# Patient Record
Sex: Female | Born: 2000 | Race: Black or African American | Hispanic: No | Marital: Single | Smoking: Former smoker
Health system: Southern US, Community
[De-identification: ages and names within clinical notes are randomized; demographics above are authoritative.]

## PROBLEM LIST (undated history)

## (undated) ENCOUNTER — Inpatient Hospital Stay (HOSPITAL_COMMUNITY): Payer: Self-pay

## (undated) DIAGNOSIS — Z5189 Encounter for other specified aftercare: Secondary | ICD-10-CM

## (undated) DIAGNOSIS — A609 Anogenital herpesviral infection, unspecified: Secondary | ICD-10-CM

## (undated) DIAGNOSIS — T783XXA Angioneurotic edema, initial encounter: Secondary | ICD-10-CM

## (undated) DIAGNOSIS — N938 Other specified abnormal uterine and vaginal bleeding: Secondary | ICD-10-CM

## (undated) DIAGNOSIS — O24419 Gestational diabetes mellitus in pregnancy, unspecified control: Secondary | ICD-10-CM

## (undated) DIAGNOSIS — K5909 Other constipation: Secondary | ICD-10-CM

## (undated) DIAGNOSIS — F32A Depression, unspecified: Secondary | ICD-10-CM

## (undated) DIAGNOSIS — K219 Gastro-esophageal reflux disease without esophagitis: Secondary | ICD-10-CM

## (undated) DIAGNOSIS — O139 Gestational [pregnancy-induced] hypertension without significant proteinuria, unspecified trimester: Secondary | ICD-10-CM

## (undated) DIAGNOSIS — D649 Anemia, unspecified: Secondary | ICD-10-CM

## (undated) DIAGNOSIS — Z21 Asymptomatic human immunodeficiency virus [HIV] infection status: Secondary | ICD-10-CM

## (undated) DIAGNOSIS — L509 Urticaria, unspecified: Secondary | ICD-10-CM

## (undated) DIAGNOSIS — B373 Candidiasis of vulva and vagina: Secondary | ICD-10-CM

## (undated) DIAGNOSIS — K8020 Calculus of gallbladder without cholecystitis without obstruction: Secondary | ICD-10-CM

## (undated) DIAGNOSIS — B3731 Acute candidiasis of vulva and vagina: Secondary | ICD-10-CM

## (undated) DIAGNOSIS — E119 Type 2 diabetes mellitus without complications: Secondary | ICD-10-CM

## (undated) DIAGNOSIS — B20 Human immunodeficiency virus [HIV] disease: Secondary | ICD-10-CM

## (undated) HISTORY — DX: Other specified abnormal uterine and vaginal bleeding: N93.8

## (undated) HISTORY — DX: Urticaria, unspecified: L50.9

## (undated) HISTORY — DX: Gastro-esophageal reflux disease without esophagitis: K21.9

## (undated) HISTORY — DX: Anogenital herpesviral infection, unspecified: A60.9

## (undated) HISTORY — DX: Candidiasis of vulva and vagina: B37.3

## (undated) HISTORY — DX: Other constipation: K59.09

## (undated) HISTORY — PX: NO PAST SURGERIES: SHX2092

## (undated) HISTORY — DX: Angioneurotic edema, initial encounter: T78.3XXA

## (undated) HISTORY — DX: Acute candidiasis of vulva and vagina: B37.31

## (undated) HISTORY — DX: Encounter for other specified aftercare: Z51.89

## (undated) HISTORY — DX: Calculus of gallbladder without cholecystitis without obstruction: K80.20

---
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
## 2019-09-26 IMAGING — DX DG KNEE COMPLETE 4+V*L*
4 series · 4 of 4 positions shown · non-contrast
Comparison: 08/07/2012

CLINICAL DATA: BILATERAL KNEE PAIN, PATIENT STATES " NO KNOWN NEW
INJURY, INJURED RIGHT KNEE IN MIDDLE SCHOOL HAS HAD PAIN ON AND OFF
SINCE THEN, HAS HAD TROUBLE WITH BOTH KNEES SWELLING IN THE PAST BUT
NOT AS MUCH AS THEY HAVE SWOLLEN THIS PAST WEEK , RIGHT KNEE IS THE
MOST PAINFUL, PAIN WHEN WALKING

EXAM:
LEFT KNEE - COMPLETE 4+ VIEW

[knee ap]
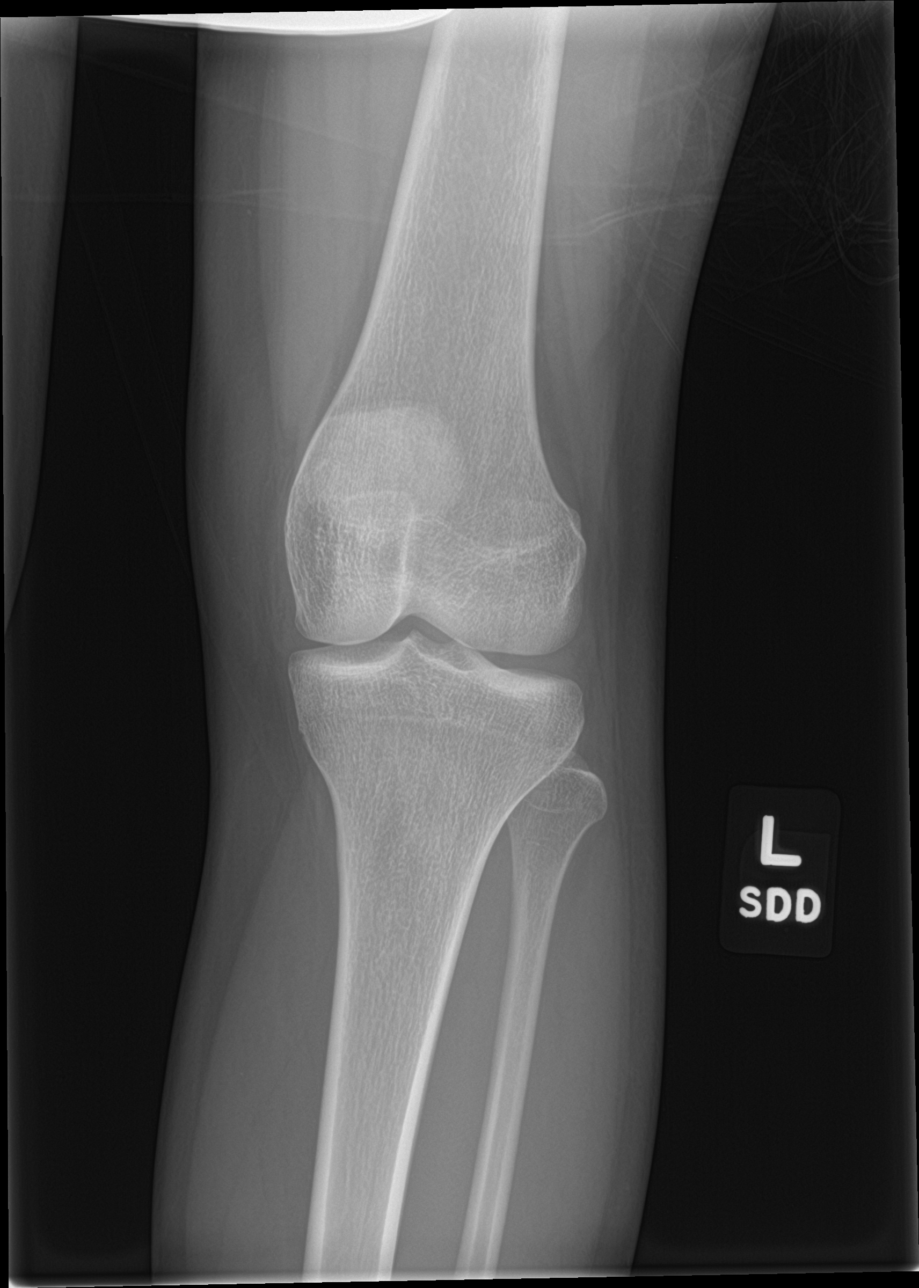

[tunnel]
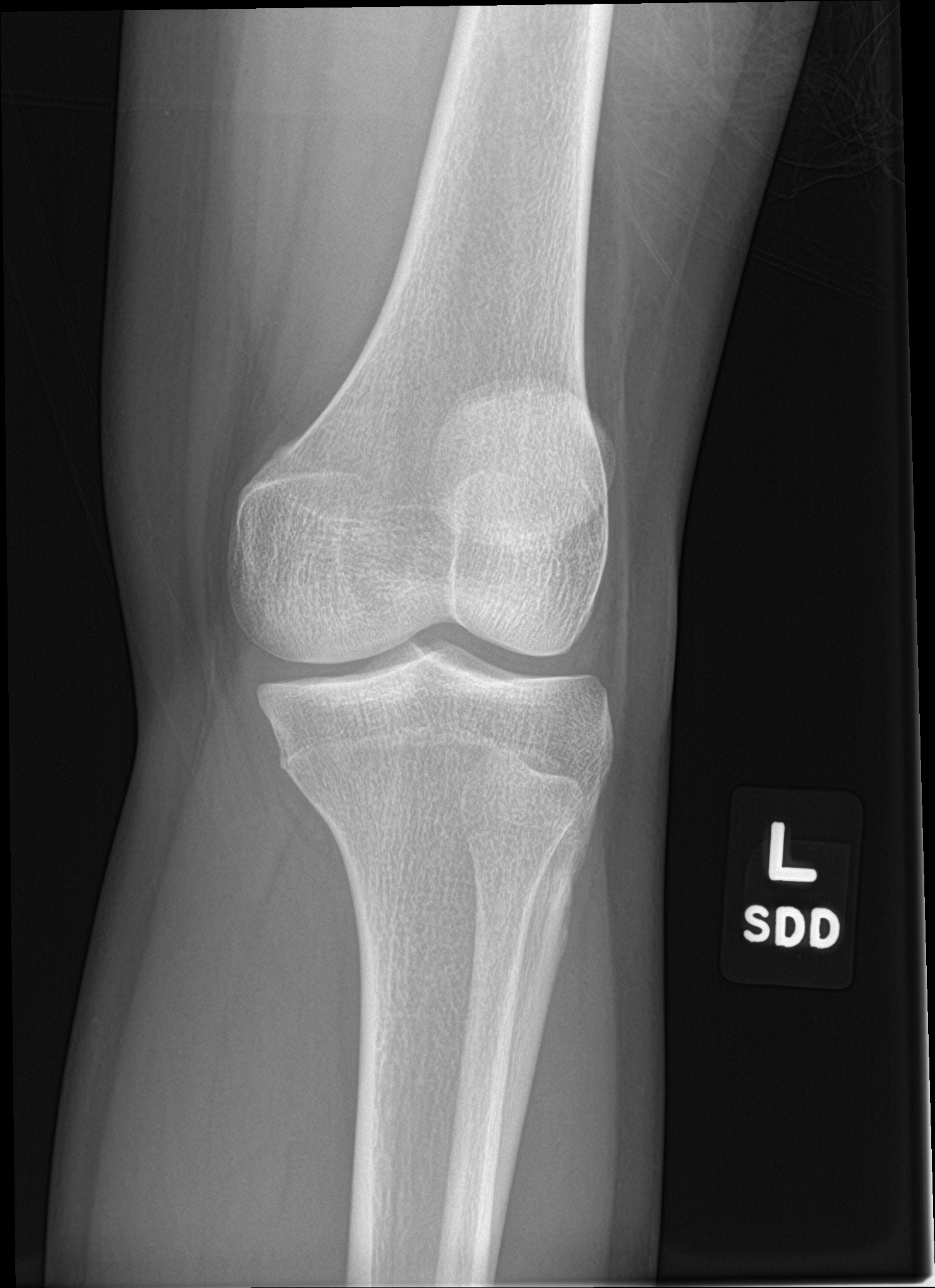

[knee lat]
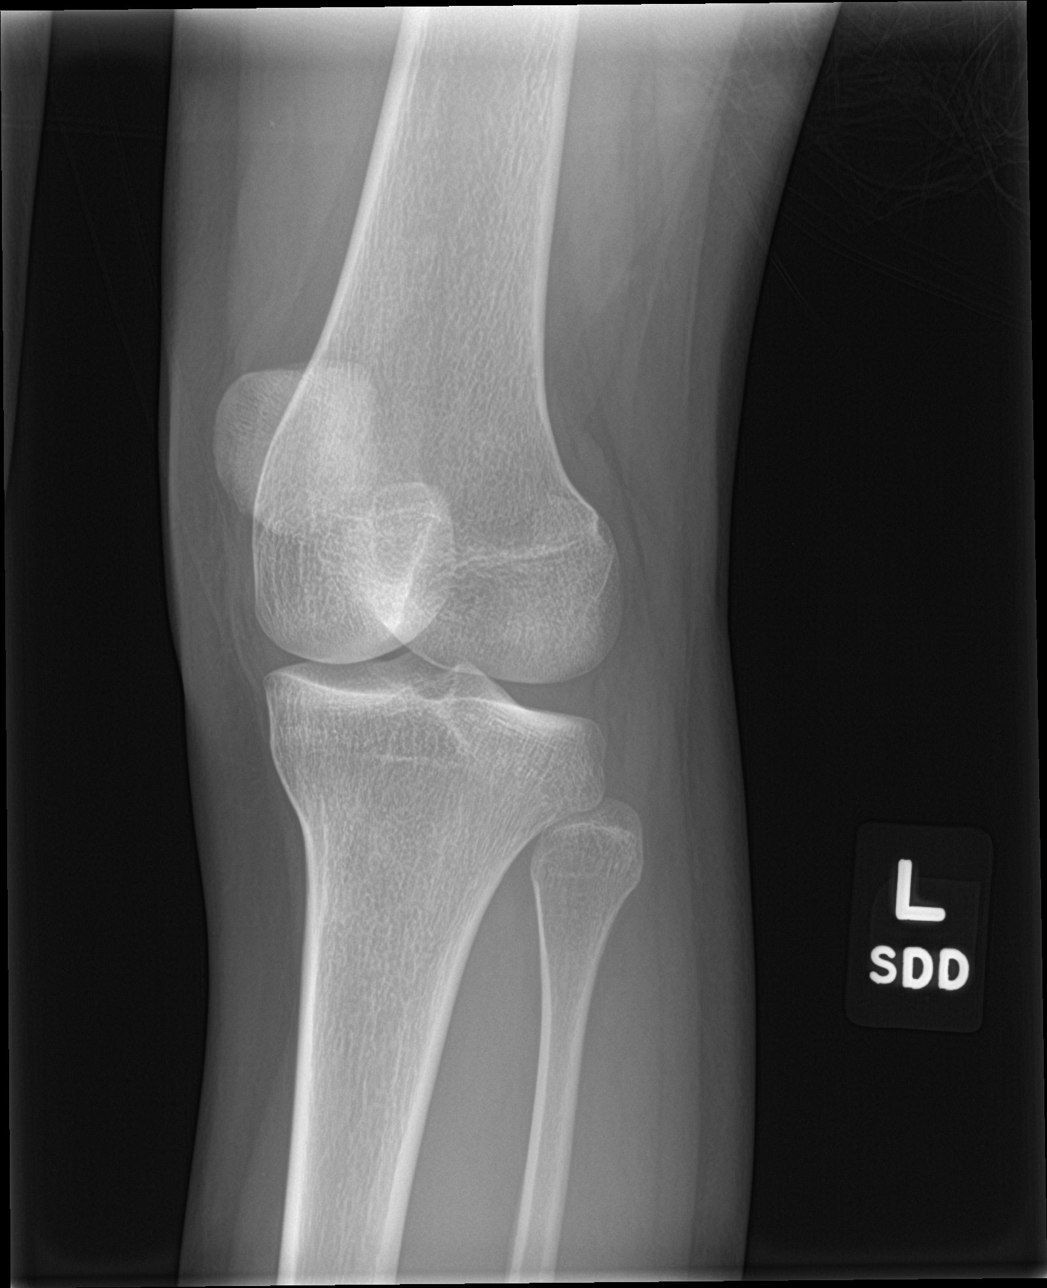

[knee sunrise]
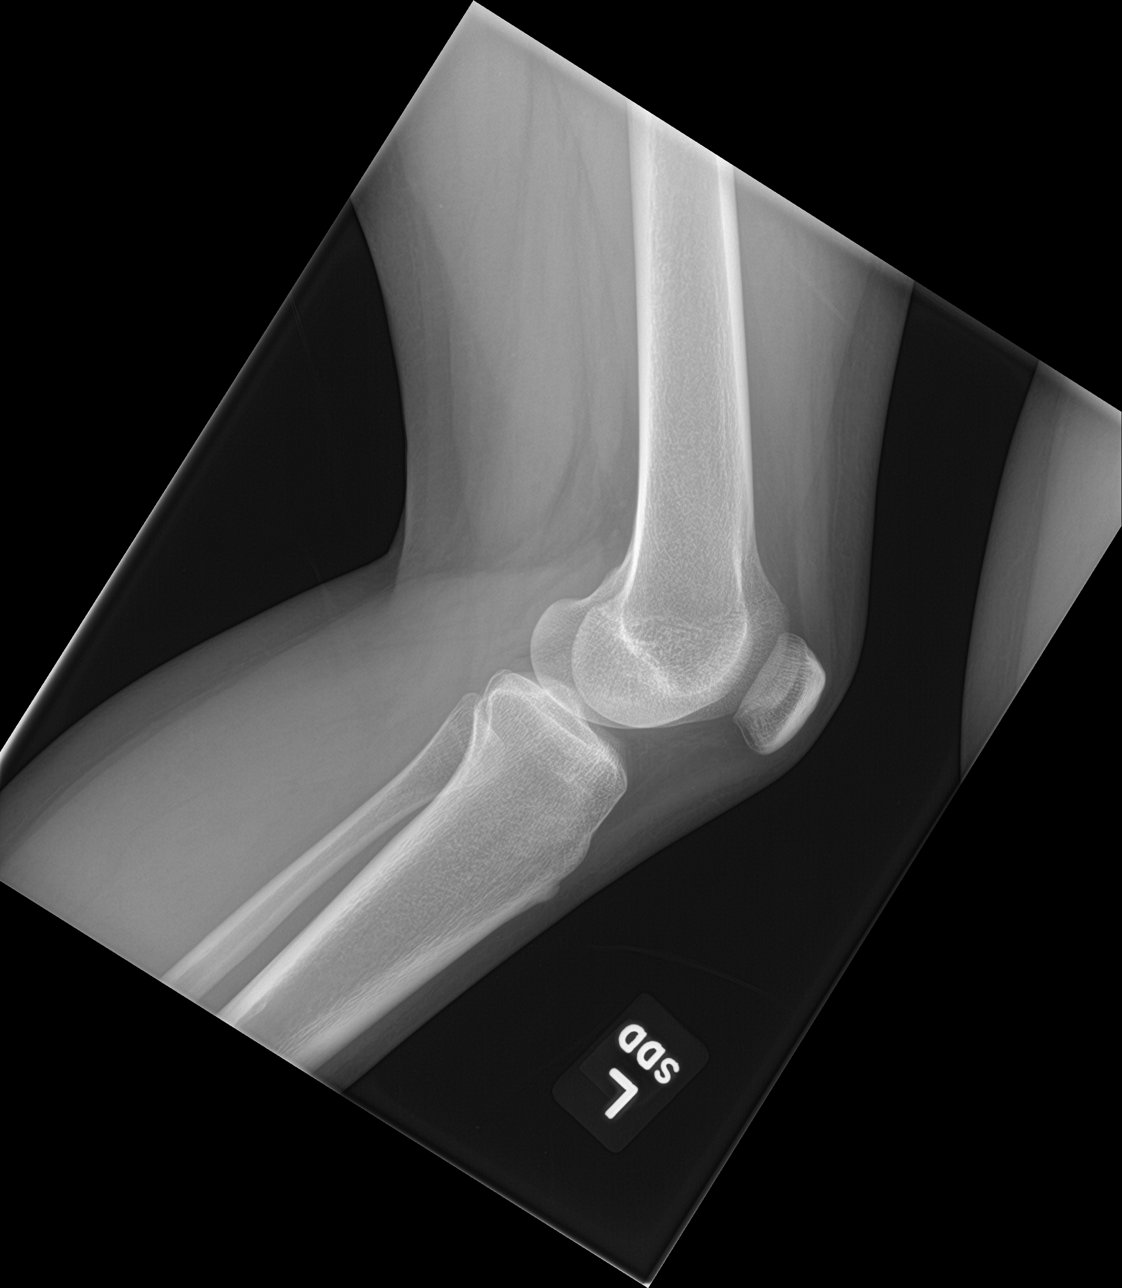

[4 of 4 positions shown; findings below may reference images not displayed]

FINDINGS: No evidence of fracture, dislocation, or joint effusion. Interval
growth. No evidence of arthropathy or other focal bone abnormality.
Soft tissues are unremarkable.
IMPRESSION: Negative.

## 2019-11-09 ENCOUNTER — Emergency Department (HOSPITAL_COMMUNITY)
Admission: EM | Admit: 2019-11-09 | Discharge: 2019-11-09 | Discharge status: Against Medical Advice | Payer: BC Managed Care – PPO

## 2019-11-09 ENCOUNTER — Ambulatory Visit
Admission: EM | Admit: 2019-11-09 | Discharge: 2019-11-09 | Disposition: A | Discharge status: Home / Self Care | Payer: BC Managed Care – PPO

## 2019-11-09 ENCOUNTER — Other Ambulatory Visit: Payer: Self-pay

## 2019-11-09 DIAGNOSIS — R1084 Generalized abdominal pain: Secondary | ICD-10-CM

## 2019-11-09 DIAGNOSIS — R110 Nausea: Secondary | ICD-10-CM

## 2019-11-09 NOTE — ED Provider Notes (Signed)
[facility]   [id number] 11/09/19 Arrival Time: 1307  CC: ABDOMINAL DISCOMFORT  SUBJECTIVE:  Alyssa Sandoval is a 19 y.o. female who presents with complaint of abdominal discomfort that began 1 day ago.  Denies a precipitating event, trauma, close contacts with similar symptoms, recent travel or antibiotic use.  Pain is diffuse.  Describes as constant and sharp in character.  Has tried OTC medications without relief.  Denies alleviating or aggravating factors.  Denies similar symptoms in the past.  Last BM today and normal for pain. Complains of headache and nausea as well.    Denies fever, chills, vomiting, chest pain, SOB, diarrhea, constipation, hematochezia, melena, dysuria, difficulty urinating, increased frequency or urgency, flank pain, loss of bowel or bladder function, vaginal discharge, vaginal odor, vaginal bleeding, dyspareunia, pelvic pain.     Patient's last menstrual period was 10/11/2019 (approximate).  ROS: As per HPI.  All other pertinent ROS negative.     Past Medical History:  Diagnosis Date  . Chronic constipation 06/10/2012  . Dysfunctional uterine bleeding   . GERD (gastroesophageal reflux disease) 06/10/2012  . Vaginal yeast infection    Past Surgical History:  Procedure Laterality Date  . NO PAST SURGERIES     No Known Allergies No current facility-administered medications on file prior to encounter.   Current Outpatient Medications on File Prior to Encounter  Medication Sig Dispense Refill  . ibuprofen (ADVIL) 200 MG tablet Take 200 mg by mouth every 6 (six) hours as needed.    . [DISCONTINUED] norelgestromin-ethinyl estradiol (ORTHO EVRA) 150-35 MCG/24HR transdermal patch Place 1 patch onto the skin once a week. 3 patch 12   Social History   Socioeconomic History  . Marital status: Single    Spouse name: Not on file  . Number of children: Not on file  . Years of education: Not on file  . Highest education level: Not on file  Occupational  History  . Not on file  Tobacco Use  . Smoking status: Current Every Day Smoker    Types: Cigars  . Smokeless tobacco: Never Used  Vaping Use  . Vaping Use: Never assessed  Substance and Sexual Activity  . Alcohol use: No  . Drug use: Yes    Types: Marijuana  . Sexual activity: Yes    Partners: Male    Birth control/protection: Injection  Other Topics Concern  . Not on file  Social History Narrative   Marital status/children/pets: Single.    Education/employment: Psychologist, occupational      Social Determinants of Health   Financial Resource Strain:   . Difficulty of Paying Living Expenses:   Food Insecurity:   . Worried About Programme researcher, broadcasting/film/video in the Last Year:   . Barista in the Last Year:   Transportation Needs:   . Freight forwarder (Medical):   Marland Kitchen Lack of Transportation (Non-Medical):   Physical Activity:   . Days of Exercise per Week:   . Minutes of Exercise per Session:   Stress:   . Feeling of Stress :   Social Connections:   . Frequency of Communication with Friends and Family:   . Frequency of Social Gatherings with Friends and Family:   . Attends Religious Services:   . Active Member of Clubs or Organizations:   . Attends Banker Meetings:   Marland Kitchen Marital Status:   Intimate Partner Violence:   . Fear of Current or Ex-Partner:   . Emotionally Abused:   Marland Kitchen Physically Abused:   .  Sexually Abused:    Family History  Problem Relation Age of Onset  . Hypertension Maternal Grandmother   . Colon cancer Maternal Grandfather   . Fibromyalgia Mother      OBJECTIVE:  Vitals:   11/09/19 1323  BP: 112/69  Pulse: 73  Resp: 16  Temp: 98.6 F (37 C)  TempSrc: Oral  SpO2: 96%    General appearance: Alert; NAD HEENT: NCAT.  Oropharynx clear.  Lungs: clear to auscultation bilaterally without adventitious breath sounds Heart: regular rate and rhythm.   Abdomen: soft, non-distended; normal active bowel sounds; diffusely TTP over  epigastric region and lower abdomen; negative Murphy's sign; no guarding Extremities: no edema; symmetrical with no gross deformities Skin: warm and dry Neurologic: normal gait Psychological: alert and cooperative; normal mood and affect  ASSESSMENT & PLAN:  1. Generalized abdominal pain   2. Nausea without vomiting     Recommending further evaluation and management in the ED cannot rule out appendicitis, pancreatitis, ovarian cysts, TOA, diverticulitis, etc... in urgent care setting.  Patient aware and in agreement.  Will go by private vehicle to [facility] ED.     Rennis Harding, PA-C 11/09/19 1351

## 2019-11-09 NOTE — Discharge Instructions (Signed)
Recommending further evaluation and management in the ED cannot rule out appendicitis, pancreatitis, ovarian cysts, TOA, diverticulitis, etc... in urgent care setting.  Patient aware and in agreement.  Will go by private vehicle to [facility] ED.

## 2019-11-09 NOTE — ED Triage Notes (Addendum)
Pt presents with headache and lower abdominal pain. Denies diarrhea, dysuria, fever, chills. Ibuprofen do not relief the pain. States her menstrual period was on form 7/4-7/27

## 2019-11-09 NOTE — ED Triage Notes (Signed)
Not in waiting area when called for triage 

## 2020-04-09 DIAGNOSIS — U071 COVID-19: Secondary | ICD-10-CM

## 2020-04-09 HISTORY — DX: COVID-19: U07.1

## 2020-04-20 ENCOUNTER — Emergency Department (HOSPITAL_COMMUNITY)
Admission: EM | Admit: 2020-04-20 | Discharge: 2020-04-20 | Disposition: A | Discharge status: Home / Self Care | Payer: 59 | Attending: Emergency Medicine | Admitting: Emergency Medicine

## 2020-04-20 ENCOUNTER — Other Ambulatory Visit: Payer: Self-pay

## 2020-04-20 ENCOUNTER — Encounter (HOSPITAL_COMMUNITY): Payer: Self-pay

## 2020-04-20 DIAGNOSIS — K219 Gastro-esophageal reflux disease without esophagitis: Secondary | ICD-10-CM | POA: Insufficient documentation

## 2020-04-20 DIAGNOSIS — N3001 Acute cystitis with hematuria: Secondary | ICD-10-CM | POA: Diagnosis not present

## 2020-04-20 DIAGNOSIS — F17290 Nicotine dependence, other tobacco product, uncomplicated: Secondary | ICD-10-CM | POA: Insufficient documentation

## 2020-04-20 DIAGNOSIS — U071 COVID-19: Secondary | ICD-10-CM | POA: Insufficient documentation

## 2020-04-20 DIAGNOSIS — R059 Cough, unspecified: Secondary | ICD-10-CM | POA: Diagnosis present

## 2020-04-20 LAB — URINALYSIS, ROUTINE W REFLEX MICROSCOPIC
Bilirubin Urine: NEGATIVE
Glucose, UA: NEGATIVE mg/dL
Ketones, ur: 80 mg/dL — AB
Nitrite: NEGATIVE
Protein, ur: 100 mg/dL — AB
Specific Gravity, Urine: 1.025 (ref 1.005–1.030)
WBC, UA: >50 WBC/hpf — ABNORMAL HIGH (ref 0–5)
pH: 5 (ref 5.0–8.0)

## 2020-04-20 LAB — POC URINE PREG, ED: Preg Test, Ur: NEGATIVE

## 2020-04-20 MED ORDER — CEPHALEXIN 500 MG PO CAPS: 500.0000 mg | ORAL_CAPSULE | Freq: Two times a day (BID) | ORAL | 0 refills | Status: DC

## 2020-04-20 MED ORDER — SODIUM CHLORIDE 0.9 % IV BOLUS
1000.0000 mL | Freq: Once | INTRAVENOUS | Status: AC
  Administered 2020-04-20: 1000 mL via INTRAVENOUS

## 2020-04-20 MED ORDER — ONDANSETRON HCL 4 MG/2ML IJ SOLN
4.0000 mg | Freq: Once | INTRAMUSCULAR | Status: AC
  Administered 2020-04-20: 4 mg via INTRAVENOUS
  Filled 2020-04-20: qty 2

## 2020-04-20 MED ORDER — ONDANSETRON 4 MG PO TBDP: 4.0000 mg | ORAL_TABLET | Freq: Three times a day (TID) | ORAL | 0 refills | Status: DC | PRN

## 2020-04-20 MED ORDER — SODIUM CHLORIDE 0.9 % IV SOLN
1.0000 g | Freq: Once | INTRAVENOUS | Status: AC
  Administered 2020-04-20: 1 g via INTRAVENOUS
  Filled 2020-04-20: qty 10

## 2020-04-20 NOTE — ED Provider Notes (Signed)
[facility] EMERGENCY DEPARTMENT Provider Note   CSN: [id number] Arrival date & time: 04/20/20  1555     History Chief Complaint  Patient presents with  . Abdominal Pain    Alyssa Sandoval is a 19 y.o. female with a history as outlined below, including DVT, GERD, history of constipation and frequent yeast infections presenting for evaluation of a 2-day history of suprapubic pain described as constant sharp pain in association with urinary frequency, but only passing small amounts of urine with these episodes.  She also has low back aching pain, denies flank pain, fevers or chills.  She has had some mild nausea, had a poor appetite yesterday and had 1 episode of vomiting.  She is also reporting a nonproductive cough also present for 2 days, denies chest pain, shortness of breath, rhinorrhea nasal congestion or sore throat.  She is concerned about possible COVID infection.  She is not COVID vaccinated.  She has had no medications for her symptoms nor has she found alleviators.  The history is provided by the patient.       Past Medical History:  Diagnosis Date  . Chronic constipation 06/10/2012  . Dysfunctional uterine bleeding   . GERD (gastroesophageal reflux disease) 06/10/2012  . Vaginal yeast infection     Patient Active Problem List   Diagnosis Date Noted  . Mastitis, acute 08/19/2018  . Screen for STD (sexually transmitted disease) 08/12/2018    Past Surgical History:  Procedure Laterality Date  . NO PAST SURGERIES       OB History    Gravida  0   Para  0   Term  0   Preterm  0   AB  0   Living  0     SAB  0   IAB  0   Ectopic  0   Multiple  0   Live Births  0           Family History  Problem Relation Age of Onset  . Hypertension Maternal Grandmother   . Colon cancer Maternal Grandfather   . Fibromyalgia Mother     Social History   Tobacco Use  . Smoking status: Current Every Day Smoker    Types: Cigars  . Smokeless tobacco: Never Used   Substance Use Topics  . Alcohol use: No  . Drug use: Yes    Types: Marijuana    Home Medications Prior to Admission medications   Medication Sig Start Date End Date Taking? Authorizing Provider  cephALEXin (KEFLEX) 500 MG capsule Take 1 capsule (500 mg total) by mouth 2 (two) times daily. 04/20/20  Yes Rebeka Kimble, Raynelle Fanning, PA-C  ondansetron (ZOFRAN ODT) 4 MG disintegrating tablet Take 1 tablet (4 mg total) by mouth every 8 (eight) hours as needed for nausea or vomiting. 04/20/20  Yes Dyland Panuco, Raynelle Fanning, PA-C  ibuprofen (ADVIL) 200 MG tablet Take 200 mg by mouth every 6 (six) hours as needed.    [provider]  norelgestromin-ethinyl estradiol (ORTHO EVRA) 150-35 MCG/24HR transdermal patch Place 1 patch onto the skin once a week. 10/22/18 11/09/19  Felix Pacini A, DO    Allergies    Patient has no known allergies.  Review of Systems   Review of Systems  Constitutional: Negative for chills and fever.  HENT: Negative for congestion and sore throat.   Eyes: Negative.   Respiratory: Positive for cough. Negative for chest tightness and shortness of breath.   Cardiovascular: Negative for chest pain.  Gastrointestinal: Positive for nausea and  vomiting. Negative for abdominal pain.  Genitourinary: Positive for frequency and urgency. Negative for dysuria.  Musculoskeletal: Positive for back pain. Negative for arthralgias, joint swelling and neck pain.  Skin: Negative.  Negative for rash and wound.  Neurological: Negative for dizziness, weakness, light-headedness, numbness and headaches.  Psychiatric/Behavioral: Negative.     Physical Exam Updated Vital Signs BP 107/68 (BP Location: Right Arm)   Pulse 76   Temp 98.2 F (36.8 C) (Oral)   Resp 18   Ht 4\' 10"  (1.473 m)   Wt 45.4 kg   LMP 04/04/2020   SpO2 100%   BMI 20.90 kg/m   Physical Exam Vitals and nursing note reviewed.  Constitutional:      Appearance: She is well-developed and well-nourished.  HENT:     Head: Normocephalic and  atraumatic.  Eyes:     Conjunctiva/sclera: Conjunctivae normal.  Cardiovascular:     Rate and Rhythm: Normal rate and regular rhythm.     Pulses: Intact distal pulses.     Heart sounds: Normal heart sounds.  Pulmonary:     Effort: Pulmonary effort is normal.     Breath sounds: Normal breath sounds. No wheezing.  Abdominal:     General: Bowel sounds are normal.     Palpations: Abdomen is soft.     Tenderness: There is abdominal tenderness in the suprapubic area. There is no right CVA tenderness, left CVA tenderness, guarding or rebound.     Comments: Mild suprapubic tenderness  Musculoskeletal:        General: Normal range of motion.     Cervical back: Normal range of motion.  Skin:    General: Skin is warm and dry.  Neurological:     Mental Status: She is alert.  Psychiatric:        Mood and Affect: Mood and affect normal.     ED Results / Procedures / Treatments   Labs (all labs ordered are listed, but only abnormal results are displayed) Labs Reviewed  SARS CORONAVIRUS 2 (TAT 6-24 HRS) - Abnormal; Notable for the following components:      Result Value   SARS Coronavirus 2 POSITIVE (*)    All other components within normal limits  URINALYSIS, ROUTINE W REFLEX MICROSCOPIC - Abnormal; Notable for the following components:   APPearance CLOUDY (*)    Hgb urine dipstick SMALL (*)    Ketones, ur 80 (*)    Protein, ur 100 (*)    Leukocytes,Ua LARGE (*)    WBC, UA >50 (*)    Bacteria, UA RARE (*)    Non Squamous Epithelial 0-5 (*)    All other components within normal limits  URINE CULTURE  POC URINE PREG, ED    EKG None  Radiology No results found.  Procedures Procedures (including critical care time)  Medications Ordered in ED Medications  sodium chloride 0.9 % bolus 1,000 mL (0 mLs Intravenous Stopped 04/20/20 2300)  ondansetron (ZOFRAN) injection 4 mg (4 mg Intravenous Given 04/20/20 2208)  cefTRIAXone (ROCEPHIN) 1 g in sodium chloride 0.9 % 100 mL IVPB (0 g  Intravenous Stopped 04/20/20 2240)    ED Course  I have reviewed the triage vital signs and the nursing notes.  Pertinent labs & imaging results that were available during my care of the patient were reviewed by me and considered in my medical decision making (see chart for details).    MDM Rules/Calculators/A&P  Labs reviewed and discussed with pt. Covid pending at time of dc, but resulted positive after pt dispo home.  Called pt to inform of results and reiterated need for quarantine. Pt with large leukocytes and rbcs in urine, rare bacteria but sx suggest acute cystitis.  Urine cx ordered.  She was given IV fluids and rocephin IV, keflex for completion of tx prescribed.    Pt was given home instructions, including need for home quarantine instructions for positive covid test.  Return precautions given.  Alyssa Sandoval was evaluated in Emergency Department on 04/21/2020 for the symptoms described in the history of present illness. She was evaluated in the context of the global COVID-19 pandemic, which necessitated consideration that the patient might be at risk for infection with the SARS-CoV-2 virus that causes COVID-19. Institutional protocols and algorithms that pertain to the evaluation of patients at risk for COVID-19 are in a state of rapid change based on information released by regulatory bodies including the CDC and federal and state organizations. These policies and algorithms were followed during the patient's care in the ED.  Final Clinical Impression(s) / ED Diagnoses Final diagnoses:  Acute cystitis with hematuria    Rx / DC Orders ED Discharge Orders         Ordered    cephALEXin (KEFLEX) 500 MG capsule  2 times daily        04/20/20 2313    ondansetron (ZOFRAN ODT) 4 MG disintegrating tablet  Every 8 hours PRN        04/20/20 2313           Victoriano Lain 04/21/20 2021    Eber Hong, MD 04/21/20 2115

## 2020-04-20 NOTE — Discharge Instructions (Addendum)
Take your next dose of the antibiotics tomorrow morning.  Make sure you are drinking plenty of fluids.  Get rechecked if you develop any return of vomiting, or you have fevers, weakness, shortness of breath or new symptoms.    Your covid test is pending at this time and should result by tomorrow.  If your test is positive you will need to be in home quarantine for 5 days from the day of onset of symptoms as long as you are feeling better by then.  I have provided information below about useful tips for preventing others in your home from getting this infection if you are positive.      Person Under Monitoring Name: Alyssa Sandoval  Location: [address] [place] [address]   Infection Prevention Recommendations for Individuals Confirmed to have, or Being Evaluated for, 2019 Novel Coronavirus (COVID-19) Infection Who Receive Care at Home  Individuals who are confirmed to have, or are being evaluated for, COVID-19 should follow the prevention steps below until a healthcare provider or local or state health department says they can return to normal activities.  Stay home except to get medical care You should restrict activities outside your home, except for getting medical care. Do not go to work, school, or public areas, and do not use public transportation or taxis.  Call ahead before visiting your doctor Before your medical appointment, call the healthcare provider and tell them that you have, or are being evaluated for, COVID-19 infection. This will help the healthcare providers office take steps to keep other people from getting infected. Ask your healthcare provider to call the local or state health department.  Monitor your symptoms Seek prompt medical attention if your illness is worsening (e.g., difficulty breathing). Before going to your medical appointment, call the healthcare provider and tell them that you have, or are being evaluated for, COVID-19 infection.  Ask your healthcare provider to call the local or state health department.  Wear a facemask You should wear a facemask that covers your nose and mouth when you are in the same room with other people and when you visit a healthcare provider. People who live with or visit you should also wear a facemask while they are in the same room with you.  Separate yourself from other people in your home As much as possible, you should stay in a different room from other people in your home. Also, you should use a separate bathroom, if available.  Avoid sharing household items You should not share dishes, drinking glasses, cups, eating utensils, towels, bedding, or other items with other people in your home. After using these items, you should wash them thoroughly with soap and water.  Cover your coughs and sneezes Cover your mouth and nose with a tissue when you cough or sneeze, or you can cough or sneeze into your sleeve. Throw used tissues in a lined trash can, and immediately wash your hands with soap and water for at least 20 seconds or use an alcohol-based hand rub.  Wash your [employer] your hands often and thoroughly with soap and water for at least 20 seconds. You can use an alcohol-based hand sanitizer if soap and water are not available and if your hands are not visibly dirty. Avoid touching your eyes, nose, and mouth with unwashed hands.   Prevention Steps for Caregivers and Household Members of Individuals Confirmed to have, or Being Evaluated for, COVID-19 Infection Being Cared for in the Home  If you live with,  or provide care at home for, a person confirmed to have, or being evaluated for, COVID-19 infection please follow these guidelines to prevent infection:  Follow healthcare providers instructions Make sure that you understand and can help the patient follow any healthcare provider instructions for all care.  Provide for the patients basic needs You should help the  patient with basic needs in the home and provide support for getting groceries, prescriptions, and other personal needs.  Monitor the patients symptoms If they are getting sicker, call his or her medical provider and tell them that the patient has, or is being evaluated for, COVID-19 infection. This will help the healthcare providers office take steps to keep other people from getting infected. Ask the healthcare provider to call the local or state health department.  Limit the number of people who have contact with the patient If possible, have only one caregiver for the patient. Other household members should stay in another home or place of residence. If this is not possible, they should stay in another room, or be separated from the patient as much as possible. Use a separate bathroom, if available. Restrict visitors who do not have an essential need to be in the home.  Keep older adults, very young children, and other sick people away from the patient Keep older adults, very young children, and those who have compromised immune systems or chronic health conditions away from the patient. This includes people with chronic heart, lung, or kidney conditions, diabetes, and cancer.  Ensure good ventilation Make sure that shared spaces in the home have good air flow, such as from an air conditioner or an opened window, weather permitting.  Wash your hands often Wash your hands often and thoroughly with soap and water for at least 20 seconds. You can use an alcohol based hand sanitizer if soap and water are not available and if your hands are not visibly dirty. Avoid touching your eyes, nose, and mouth with unwashed hands. Use disposable paper towels to dry your hands. If not available, use dedicated cloth towels and replace them when they become wet.  Wear a facemask and gloves Wear a disposable facemask at all times in the room and gloves when you touch or have contact with the patients  blood, body fluids, and/or secretions or excretions, such as sweat, saliva, sputum, nasal mucus, vomit, urine, or feces.  Ensure the mask fits over your nose and mouth tightly, and do not touch it during use. Throw out disposable facemasks and gloves after using them. Do not reuse. Wash your hands immediately after removing your facemask and gloves. If your personal clothing becomes contaminated, carefully remove clothing and launder. Wash your hands after handling contaminated clothing. Place all used disposable facemasks, gloves, and other waste in a lined container before disposing them with other household waste. Remove gloves and wash your hands immediately after handling these items.  Do not share dishes, glasses, or other household items with the patient Avoid sharing household items. You should not share dishes, drinking glasses, cups, eating utensils, towels, bedding, or other items with a patient who is confirmed to have, or being evaluated for, COVID-19 infection. After the person uses these items, you should wash them thoroughly with soap and water.  Wash laundry thoroughly Immediately remove and wash clothes or bedding that have blood, body fluids, and/or secretions or excretions, such as sweat, saliva, sputum, nasal mucus, vomit, urine, or feces, on them. Wear gloves when handling laundry from the patient. Read and  follow directions on labels of laundry or clothing items and detergent. In general, wash and dry with the warmest temperatures recommended on the label.  Clean all areas the individual has used often Clean all touchable surfaces, such as counters, tabletops, doorknobs, bathroom fixtures, toilets, phones, keyboards, tablets, and bedside tables, every day. Also, clean any surfaces that may have blood, body fluids, and/or secretions or excretions on them. Wear gloves when cleaning surfaces the patient has come in contact with. Use a diluted bleach solution (e.g., dilute  bleach with 1 part bleach and 10 parts water) or a household disinfectant with a label that says EPA-registered for coronaviruses. To make a bleach solution at home, add 1 tablespoon of bleach to 1 quart (4 cups) of water. For a larger supply, add  cup of bleach to 1 gallon (16 cups) of water. Read labels of cleaning products and follow recommendations provided on product labels. Labels contain instructions for safe and effective use of the cleaning product including precautions you should take when applying the product, such as wearing gloves or eye protection and making sure you have good ventilation during use of the product. Remove gloves and wash hands immediately after cleaning.  Monitor yourself for signs and symptoms of illness Caregivers and household members are considered close contacts, should monitor their health, and will be asked to limit movement outside of the home to the extent possible. Follow the monitoring steps for close contacts listed on the symptom monitoring form.   ? If you have additional questions, contact your local health department or call the epidemiologist on call at [phone] (available 24/7). ? This guidance is subject to change. For the most up-to-date guidance from [facility], please refer to their website: [email or web address]

## 2020-04-20 NOTE — ED Triage Notes (Signed)
Pt presents to ED with complaints of lower abdominal pain and headache x 2 days, constipation. Pt states pain is sharp and is consistent

## 2020-04-20 NOTE — ED Provider Notes (Signed)
Medical screening examination/treatment/procedure(s) were conducted as a shared visit with non-physician practitioner(s) and myself.  I personally evaluated the patient during the encounter.  Clinical Impression:   Final diagnoses:  None   This patient is a well-appearing 20 year old female, normal vital signs presenting with some lower back pain, some lower abdominal pain, she has a urinalysis consistent with urinary tract infection and on my exam has a tenderness in the lower abdomen as well as slight left CVA tenderness.  No fever, no tachycardia, not pregnant, antibiotics, fluids, tolerating p.o., stable for discharge   Eber Hong, MD 04/21/20 2118

## 2020-04-21 ENCOUNTER — Telehealth (HOSPITAL_COMMUNITY): Payer: Self-pay

## 2020-04-21 LAB — SARS CORONAVIRUS 2 (TAT 6-24 HRS): SARS Coronavirus 2: POSITIVE — AB

## 2020-04-23 LAB — URINE CULTURE: Culture: >=100000 — AB

## 2020-04-29 ENCOUNTER — Encounter: Payer: Self-pay | Admitting: Family Medicine

## 2020-09-02 ENCOUNTER — Encounter (HOSPITAL_COMMUNITY): Payer: Self-pay

## 2020-09-02 ENCOUNTER — Emergency Department (HOSPITAL_COMMUNITY)
Admission: EM | Admit: 2020-09-02 | Discharge: 2020-09-03 | Disposition: A | Discharge status: Home / Self Care | Payer: 59 | Attending: Emergency Medicine | Admitting: Emergency Medicine

## 2020-09-02 ENCOUNTER — Other Ambulatory Visit: Payer: Self-pay

## 2020-09-02 DIAGNOSIS — N3091 Cystitis, unspecified with hematuria: Secondary | ICD-10-CM | POA: Insufficient documentation

## 2020-09-02 DIAGNOSIS — R319 Hematuria, unspecified: Secondary | ICD-10-CM | POA: Diagnosis present

## 2020-09-02 DIAGNOSIS — F17290 Nicotine dependence, other tobacco product, uncomplicated: Secondary | ICD-10-CM | POA: Diagnosis not present

## 2020-09-02 DIAGNOSIS — B9689 Other specified bacterial agents as the cause of diseases classified elsewhere: Secondary | ICD-10-CM | POA: Insufficient documentation

## 2020-09-02 LAB — URINALYSIS, ROUTINE W REFLEX MICROSCOPIC
Bilirubin Urine: NEGATIVE
Glucose, UA: NEGATIVE mg/dL
Ketones, ur: 20 mg/dL — AB
Nitrite: NEGATIVE
Protein, ur: 100 mg/dL — AB
RBC / HPF: >50 RBC/hpf — ABNORMAL HIGH (ref 0–5)
Specific Gravity, Urine: 1.023 (ref 1.005–1.030)
Squamous Epithelial / HPF: >50 — ABNORMAL HIGH (ref 0–5)
WBC, UA: >50 WBC/hpf — ABNORMAL HIGH (ref 0–5)
pH: 5 (ref 5.0–8.0)

## 2020-09-02 LAB — PREGNANCY, URINE: Preg Test, Ur: NEGATIVE

## 2020-09-02 NOTE — ED Triage Notes (Signed)
Pt arrived via POV c/o dysuria. Pt reports pain began 2 days ago and she is experiencing hematuria. Pt endorses unprotected intercourse with partner.

## 2020-09-03 MED ORDER — CEPHALEXIN 500 MG PO CAPS: 500.0000 mg | ORAL_CAPSULE | Freq: Three times a day (TID) | ORAL | 0 refills | Status: DC

## 2020-09-03 MED ORDER — CEPHALEXIN 500 MG PO CAPS
500.0000 mg | ORAL_CAPSULE | Freq: Once | ORAL | Status: AC
  Administered 2020-09-03: 500 mg via ORAL
  Filled 2020-09-03: qty 1

## 2020-09-03 NOTE — Discharge Instructions (Signed)
Take the antibiotics as prescribed and follow-up with your doctor.  Return to the ED with worsening pain, fever, vomiting, or any other concerns

## 2020-09-03 NOTE — ED Notes (Signed)
Pt left prior to getting discharge papers

## 2020-09-03 NOTE — ED Provider Notes (Signed)
[facility] EMERGENCY DEPARTMENT Provider Note   CSN: [id number] Arrival date & time: 09/02/20  2120     History No chief complaint on file.   Alyssa Sandoval is a 20 y.o. female.  Patient complains of 2 days of painful urination with some blood.  States she is urinating small amounts frequently and has burning when she urinates.  Noticed that her urine has been bloody.  Has had this in the past with a UTI.  Has some lower abdominal pain but no significant flank pain.  No fever, chills, nausea or vomiting.  No chest pain or shortness of breath.  She is sexually active with one partner.  She denies any vaginal bleeding or discharge. She comes in today because she noticed that her urine is bloody but is not coming from her vagina.  She denies any previous abdominal surgeries.  Denies any abdominal pain while she is trying to urinate. Eating and drinking well.  No vomiting.  Normal bowel movements  The history is provided by the patient.       Past Medical History:  Diagnosis Date  . Chronic constipation 06/10/2012  . Dysfunctional uterine bleeding   . GERD (gastroesophageal reflux disease) 06/10/2012  . Vaginal yeast infection     Patient Active Problem List   Diagnosis Date Noted  . Mastitis, acute 08/19/2018  . Screen for STD (sexually transmitted disease) 08/12/2018    Past Surgical History:  Procedure Laterality Date  . NO PAST SURGERIES       OB History    Gravida  0   Para  0   Term  0   Preterm  0   AB  0   Living  0     SAB  0   IAB  0   Ectopic  0   Multiple  0   Live Births  0           Family History  Problem Relation Age of Onset  . Hypertension Maternal Grandmother   . Colon cancer Maternal Grandfather   . Fibromyalgia Mother     Social History   Tobacco Use  . Smoking status: Current Every Day Smoker    Types: Cigars  . Smokeless tobacco: Never Used  Substance Use Topics  . Alcohol use: No  . Drug use: Yes    Types: Marijuana     Home Medications Prior to Admission medications   Medication Sig Start Date End Date Taking? Authorizing Provider  cephALEXin (KEFLEX) 500 MG capsule Take 1 capsule (500 mg total) by mouth 2 (two) times daily. 04/20/20   Burgess Amor, PA-C  ibuprofen (ADVIL) 200 MG tablet Take 200 mg by mouth every 6 (six) hours as needed.    [provider]  ondansetron (ZOFRAN ODT) 4 MG disintegrating tablet Take 1 tablet (4 mg total) by mouth every 8 (eight) hours as needed for nausea or vomiting. 04/20/20   Idol, Raynelle Fanning, PA-C  norelgestromin-ethinyl estradiol (ORTHO EVRA) 150-35 MCG/24HR transdermal patch Place 1 patch onto the skin once a week. 10/22/18 11/09/19  Felix Pacini A, DO    Allergies    Patient has no known allergies.  Review of Systems   Review of Systems  Constitutional: Negative for activity change, appetite change and fever.  HENT: Negative for congestion and rhinorrhea.   Respiratory: Negative for cough, chest tightness and shortness of breath.   Cardiovascular: Negative for chest pain.  Gastrointestinal: Positive for abdominal pain. Negative for nausea and vomiting.  Genitourinary:  Positive for dysuria, frequency, hematuria and urgency.  Musculoskeletal: Negative for arthralgias and myalgias.  Skin: Negative for rash.  Neurological: Negative for dizziness, weakness and headaches.   all other systems are negative except as noted in the HPI and PMH.    Physical Exam Updated Vital Signs BP 118/84 (BP Location: Left Arm)   Pulse 99   Temp 98.6 F (37 C) (Oral)   Resp 18   Ht 4\' 10"  (1.473 m)   Wt 49.9 kg   LMP 07/04/2020 (Approximate)   SpO2 96%   BMI 22.99 kg/m   Physical Exam Vitals and nursing note reviewed.  Constitutional:      General: She is not in acute distress.    Appearance: She is well-developed.  HENT:     Head: Normocephalic and atraumatic.     Mouth/Throat:     Pharynx: No oropharyngeal exudate.  Eyes:     Conjunctiva/sclera: Conjunctivae  normal.     Pupils: Pupils are equal, round, and reactive to light.  Neck:     Comments: No meningismus. Cardiovascular:     Rate and Rhythm: Normal rate and regular rhythm.     Heart sounds: Normal heart sounds. No murmur heard.   Pulmonary:     Effort: Pulmonary effort is normal. No respiratory distress.     Breath sounds: Normal breath sounds.  Abdominal:     Palpations: Abdomen is soft.     Tenderness: There is no abdominal tenderness. There is no guarding or rebound.     Comments: Mild suprapubic tenderness, no right lower quadrant tenderness.  Musculoskeletal:        General: No tenderness. Normal range of motion.     Cervical back: Normal range of motion and neck supple.     Comments: No CVAT  Skin:    General: Skin is warm.  Neurological:     Mental Status: She is alert and oriented to person, place, and time.     Cranial Nerves: No cranial nerve deficit.     Motor: No abnormal muscle tone.     Coordination: Coordination normal.     Comments:  5/5 strength throughout. CN 2-12 intact.Equal grip strength.   Psychiatric:        Behavior: Behavior normal.     ED Results / Procedures / Treatments   Labs (all labs ordered are listed, but only abnormal results are displayed) Labs Reviewed  URINALYSIS, ROUTINE W REFLEX MICROSCOPIC - Abnormal; Notable for the following components:      Result Value   Color, Urine AMBER (*)    APPearance CLOUDY (*)    Hgb urine dipstick LARGE (*)    Ketones, ur 20 (*)    Protein, ur 100 (*)    Leukocytes,Ua SMALL (*)    RBC / HPF >50 (*)    WBC, UA >50 (*)    Bacteria, UA RARE (*)    Squamous Epithelial / LPF >50 (*)    Non Squamous Epithelial 11-20 (*)    All other components within normal limits  URINE CULTURE  PREGNANCY, URINE    EKG None  Radiology No results found.  Procedures Procedures   Medications Ordered in ED Medications  cephALEXin (KEFLEX) capsule 500 mg (has no administration in time range)    ED Course   I have reviewed the triage vital signs and the nursing notes.  Pertinent labs & imaging results that were available during my care of the patient were reviewed by me and considered in my  medical decision making (see chart for details).    MDM Rules/Calculators/A&P                         Suprapubic pain with hematuria, urgency and frequency x2 days.  Vital stable, no distress, no peritonitis.  Urinalysis today is a poor sample contaminated with skin cells with has large blood and large white blood cells and leukocyte esterase.  We will send for culture.  Given her symptoms we will treat empirically for suspected cystitis.  Low suspicion for pyelonephritis or obstructing kidney stone.  She declines pelvic exam but has no vaginal bleeding or discharge.  Urine culture in January grew E. coli that was resistant to Bactrim  We will treat empirically for hemorrhagic cystitis while culture is pending.  Return precautions discussed including worsening abdominal pain, fever, vomiting, any other concerns Final Clinical Impression(s) / ED Diagnoses Final diagnoses:  Hemorrhagic cystitis    Rx / DC Orders ED Discharge Orders    None       Nigil Braman, Jeannett Senior, MD 09/03/20 [phone]

## 2020-09-05 LAB — URINE CULTURE: Culture: 80000 — AB

## 2020-09-06 ENCOUNTER — Telehealth: Payer: Self-pay | Admitting: *Deleted

## 2020-09-06 NOTE — Telephone Encounter (Signed)
Post ED Visit - Positive Culture Follow-up  Culture report reviewed by antimicrobial stewardship pharmacist: [facility] []  , Pharm.D. []  Enzo Bi, Pharm.D., BCPS AQ-ID []  , Pharm.D., BCPS []  Celedonio Miyamoto, Pharm.D., BCPS []  [place]ela[place], Garvin Fila.D., BCPS, AAHIVP []  , Pharm.D., BCPS, AAHIVP []  Georgina Pillion, PharmD, BCPS []  , PharmD, BCPS []  [place], PharmD, BCPS []  [address], PharmD []  , PharmD, BCPS []  Estella Husk, PharmD  Pharmacy Team []  Lysle Pearl, PharmD []  , PharmD []  Phillips Climes, PharmD []  , Rph []  Agapito Games) , PharmD []  Verlan Friends, PharmD []  , PharmD []  Mervyn Gay, PharmD []  , PharmD []  Vinnie Level, PharmD []  [facility], PharmD []  , PharmD []  Len Childs, PharmD   Positive urine culture Treated with Cephalexin, organism sensitive to the same and no further patient follow-up is required at this time.  , PharmD  Greer Pickerel Talley 09/06/2020, 11:07 AM

## 2020-10-19 ENCOUNTER — Encounter: Payer: Self-pay | Admitting: Family Medicine

## 2020-10-19 ENCOUNTER — Telehealth: Payer: Self-pay

## 2020-10-19 ENCOUNTER — Other Ambulatory Visit: Payer: Self-pay

## 2020-10-19 ENCOUNTER — Other Ambulatory Visit (HOSPITAL_COMMUNITY)
Admission: RE | Admit: 2020-10-19 | Discharge: 2020-10-19 | Disposition: A | Discharge status: Home / Self Care | Payer: 59 | Source: Ambulatory Visit | Attending: Family Medicine | Admitting: Family Medicine

## 2020-10-19 ENCOUNTER — Ambulatory Visit (INDEPENDENT_AMBULATORY_CARE_PROVIDER_SITE_OTHER): Payer: 59 | Admitting: Family Medicine

## 2020-10-19 VITALS — BP 98/64 | HR 86 | Temp 98.4°F | Ht 60.0 in | Wt 113.0 lb

## 2020-10-19 DIAGNOSIS — Z124 Encounter for screening for malignant neoplasm of cervix: Secondary | ICD-10-CM

## 2020-10-19 DIAGNOSIS — Z130 Encounter for screening for diseases of the blood and blood-forming organs and certain disorders involving the immune mechanism: Secondary | ICD-10-CM

## 2020-10-19 DIAGNOSIS — N912 Amenorrhea, unspecified: Secondary | ICD-10-CM

## 2020-10-19 DIAGNOSIS — R1031 Right lower quadrant pain: Secondary | ICD-10-CM

## 2020-10-19 DIAGNOSIS — N926 Irregular menstruation, unspecified: Secondary | ICD-10-CM

## 2020-10-19 DIAGNOSIS — Z114 Encounter for screening for human immunodeficiency virus [HIV]: Secondary | ICD-10-CM

## 2020-10-19 DIAGNOSIS — Z0000 Encounter for general adult medical examination without abnormal findings: Secondary | ICD-10-CM

## 2020-10-19 DIAGNOSIS — Z0001 Encounter for general adult medical examination with abnormal findings: Secondary | ICD-10-CM

## 2020-10-19 DIAGNOSIS — Z113 Encounter for screening for infections with a predominantly sexual mode of transmission: Secondary | ICD-10-CM | POA: Insufficient documentation

## 2020-10-19 DIAGNOSIS — Z13220 Encounter for screening for lipoid disorders: Secondary | ICD-10-CM | POA: Diagnosis not present

## 2020-10-19 DIAGNOSIS — Z79899 Other long term (current) drug therapy: Secondary | ICD-10-CM

## 2020-10-19 DIAGNOSIS — Z1159 Encounter for screening for other viral diseases: Secondary | ICD-10-CM

## 2020-10-19 LAB — POCT URINE PREGNANCY: Preg Test, Ur: NEGATIVE

## 2020-10-19 NOTE — Telephone Encounter (Signed)
LVM for pt to CB regarding results.  Note: pregnancy test was neg pt also needs to return to office due to blood hemolyze. Very important for pt to repeat labs due to finding during physical exam.

## 2020-10-19 NOTE — Patient Instructions (Signed)
Health Maintenance, Female Adopting a healthy lifestyle and getting preventive care are important in promoting health and wellness. Ask your health care provider about: The right schedule for you to have regular tests and exams. Things you can do on your own to prevent diseases and keep yourself healthy. What should I know about diet, weight, and exercise? Eat a healthy diet  Eat a diet that includes plenty of vegetables, fruits, low-fat dairy products, and lean protein. Do not eat a lot of foods that are high in solid fats, added sugars, or sodium.  Maintain a healthy weight Body mass index (BMI) is used to identify weight problems. It estimates body fat based on height and weight. Your health care provider can help determineyour BMI and help you achieve or maintain a healthy weight. Get regular exercise Get regular exercise. This is one of the most important things you can do for your health. Most adults should: Exercise for at least 150 minutes each week. The exercise should increase your heart rate and make you sweat (moderate-intensity exercise). Do strengthening exercises at least twice a week. This is in addition to the moderate-intensity exercise. Spend less time sitting. Even light physical activity can be beneficial. Watch cholesterol and blood lipids Have your blood tested for lipids and cholesterol at 21 years of age, then havethis test every 5 years. Have your cholesterol levels checked more often if: Your lipid or cholesterol levels are high. You are older than 21 years of age. You are at high risk for heart disease. What should I know about cancer screening? Depending on your health history and family history, you may need to have cancer screening at various ages. This may include screening for: Breast cancer. Cervical cancer. Colorectal cancer. Skin cancer. Lung cancer. What should I know about heart disease, diabetes, and high blood pressure? Blood pressure and heart  disease High blood pressure causes heart disease and increases the risk of stroke. This is more likely to develop in people who have high blood pressure readings, are of African descent, or are overweight. Have your blood pressure checked: Every 3-5 years if you are 21-39 years of age. Every year if you are 40 years old or older. Diabetes Have regular diabetes screenings. This checks your fasting blood sugar level. Have the screening done: Once every three years after age 40 if you are at a normal weight and have a low risk for diabetes. More often and at a younger age if you are overweight or have a high risk for diabetes. What should I know about preventing infection? Hepatitis B If you have a higher risk for hepatitis B, you should be screened for this virus. Talk with your health care provider to find out if you are at risk forhepatitis B infection. Hepatitis C Testing is recommended for: Everyone born from 1945 through 1965. Anyone with known risk factors for hepatitis C. Sexually transmitted infections (STIs) Get screened for STIs, including gonorrhea and chlamydia, if: You are sexually active and are younger than 21 years of age. You are older than 21 years of age and your health care provider tells you that you are at risk for this type of infection. Your sexual activity has changed since you were last screened, and you are at increased risk for chlamydia or gonorrhea. Ask your health care provider if you are at risk. Ask your health care provider about whether you are at high risk for HIV. Your health care provider may recommend a prescription medicine to help   prevent HIV infection. If you choose to take medicine to prevent HIV, you should first get tested for HIV. You should then be tested every 3 months for as long as you are taking the medicine. Pregnancy If you are about to stop having your period (premenopausal) and you may become pregnant, seek counseling before you get  pregnant. Take 400 to 800 micrograms (mcg) of folic acid every day if you become pregnant. Ask for birth control (contraception) if you want to prevent pregnancy. Osteoporosis and menopause Osteoporosis is a disease in which the bones lose minerals and strength with aging. This can result in bone fractures. If you are 65 years old or older, or if you are at risk for osteoporosis and fractures, ask your health care provider if you should: Be screened for bone loss. Take a calcium or vitamin D supplement to lower your risk of fractures. Be given hormone replacement therapy (HRT) to treat symptoms of menopause. Follow these instructions at home: Lifestyle Do not use any products that contain nicotine or tobacco, such as cigarettes, e-cigarettes, and chewing tobacco. If you need help quitting, ask your health care provider. Do not use street drugs. Do not share needles. Ask your health care provider for help if you need support or information about quitting drugs. Alcohol use Do not drink alcohol if: Your health care provider tells you not to drink. You are pregnant, may be pregnant, or are planning to become pregnant. If you drink alcohol: Limit how much you use to 0-1 drink a day. Limit intake if you are breastfeeding. Be aware of how much alcohol is in your drink. In the [place] one drink equals one 12 oz bottle of beer (355 mL), one 5 oz glass of wine (148 mL), or one 1 oz glass of hard liquor (44 mL). General instructions Schedule regular health, dental, and eye exams. Stay current with your vaccines. Tell your health care provider if: You often feel depressed. You have ever been abused or do not feel safe at home. Summary Adopting a healthy lifestyle and getting preventive care are important in promoting health and wellness. Follow your health care provider's instructions about healthy diet, exercising, and getting tested or screened for diseases. Follow your health care provider's  instructions on monitoring your cholesterol and blood pressure. This information is not intended to replace advice given to you by your health care provider. Make sure you discuss any questions you have with your healthcare provider. Document Revised: 03/19/2018 Document Reviewed: 03/19/2018 Elsevier Patient Education  2022 Elsevier Inc.  

## 2020-10-19 NOTE — Progress Notes (Signed)
This visit occurred during the SARS-CoV-2 public health emergency.  Safety protocols were in place, including screening questions prior to the visit, additional usage of staff PPE, and extensive cleaning of exam room while observing appropriate contact time as indicated for disinfecting solutions.    Patient ID: Alyssa Sandoval, female  DOB: [birth year], 20 y.o.   MRN: [id number] Patient Care Team    Relationship Specialty Notifications Start End  Natalia Leatherwood, DO PCP - General Family Medicine  04/20/20   Lazaro Arms, MD Consulting Physician Obstetrics and Gynecology  08/12/18     Chief Complaint  Patient presents with   Annual Exam    Pt is not fasting   Gynecologic Exam    Subjective: Alyssa Sandoval is a 20 y.o.  Female  present for CPE. All past medical history, surgical history, allergies, family history, immunizations, medications and social history were updated in the electronic medical record today. All recent labs, ED visits and hospitalizations within the last year were reviewed.  Well woman exam: Patient's last menstrual period was 10/04/2020.  She states it was not her routine menstrual cycle.  Her cycle only lasted approximately 3 days and was light.  She is currently not on any form of birth control.  She states that she became pregnant that would be okay with her and her significant other.  They are not actively trying but they are not trying to avoid pregnancy either.  She is not taking any prenatal vitamins.  Health maintenance:  Colonoscopy: Family history colon cancer in maternal grandmother.  Routine screening at 44.   Mammogram: No family history routine screening at 39 Cervical cancer screening: Pap, collected today.  With STD testing. Immunizations: tdap uncertain, Influenza (encouraged yearly), HPV uncertain.  [place] immunization record database is down.  We will need to evaluate if she has had these immunizations once able to view report. Infectious disease  screening: HIV and Hep C collected today. Patient has a Dental home. Hospitalizations/ED visits: Reviewed  Depression screen [facility] 2/9 10/19/2020 05/24/2017  Decreased Interest 3 2  Down, Depressed, Hopeless 2 0  PHQ - 2 Score 5 2  Altered sleeping 3 3  Tired, decreased energy 3 3  Change in appetite 2 3  Feeling bad or failure about yourself  2 0  Trouble concentrating 0 0  Moving slowly or fidgety/restless 2 0  Suicidal thoughts 0 0  PHQ-9 Score 17 11  Difficult doing work/chores - Somewhat difficult   GAD 7 : Generalized Anxiety Score 10/19/2020  Nervous, Anxious, on Edge 3  Control/stop worrying 3  Worry too much - different things 3  Trouble relaxing 3  Restless 0  Easily annoyed or irritable 3  Afraid - awful might happen 3  Total GAD 7 Score 18    Immunization History  Administered Date(s) Administered   PFIZER(Purple Top)SARS-COV-2 Vaccination 12/18/2019   Past Medical History:  Diagnosis Date   Chronic constipation 06/10/2012   COVID-19 04/2020   Dysfunctional uterine bleeding    GERD (gastroesophageal reflux disease) 06/10/2012   Vaginal yeast infection    No Known Allergies Past Surgical History:  Procedure Laterality Date   NO PAST SURGERIES     Family History  Problem Relation Age of Onset   Hypertension Maternal Grandmother    Colon cancer Maternal Grandfather    Fibromyalgia Mother    Social History   Social History Narrative   Marital status/children/pets: Single.    Education/employment: Psychologist, occupational  Allergies as of 10/19/2020   No Known Allergies      Medication List        Accurate as of October 19, 2020 11:59 PM. If you have any questions, ask your nurse or doctor.          STOP taking these medications    cephALEXin 500 MG capsule Commonly known as: KEFLEX Stopped by: Felix Pacini, DO   ibuprofen 200 MG tablet Commonly known as: ADVIL Stopped by: Felix Pacini, DO   ondansetron 4 MG disintegrating  tablet Commonly known as: Zofran ODT Stopped by: Felix Pacini, DO        All past medical history, surgical history, allergies, family history, immunizations andmedications were updated in the EMR today and reviewed under the history and medication portions of their EMR.       No results found.   ROS: 14 pt review of systems performed and negative (unless mentioned in an HPI)  Objective: BP 98/64   Pulse 86   Temp 98.4 F (36.9 C) (Oral)   Ht 5' (1.524 m)   Wt 113 lb (51.3 kg)   LMP 10/04/2020   SpO2 97%   BMI 22.07 kg/m  Patient's last menstrual period was 10/04/2020. Gen: Afebrile. No acute distress. Nontoxic in appearance well-developed, well-nourished, very pleasant female. HENT: AT. [place]. Bilateral TM visualized and normal in appearance, normal external auditory canal. MMM, no oral lesions, adequate dentition. Bilateral nares within normal limits. Throat without erythema, ulcerations or exudates.  No cough on exam, no hoarseness on exam. Eyes:Pupils Equal Round Reactive to light, Extraocular movements intact,  Conjunctiva without redness, discharge or icterus. Neck/lymp/endocrine: Supple, no lymphadenopathy, no thyromegaly CV: RRR no murmur, no edema, +2/4 P posterior tibialis pulses.  Chest: CTAB, no wheeze, rhonchi or crackles.  Normal respiratory effort.  Good air movement. Abd: Soft.  Flat.  TTP right lower quadrant.  ND. BS present.  No masses palpated. No hepatosplenomegaly. No rebound tenderness or guarding. Skin: No rashes, purpura or petechiae. Warm and well-perfused. Skin intact. Neuro/Msk:  Normal gait. PERLA. EOMi. Alert. Oriented x3.  Cranial nerves II through XII intact. Muscle strength 5/5 upper/lower extremity. DTRs equal bilaterally. Psych: Normal affect, dress and demeanor. Normal speech. Normal thought content and judgment.  No SI or HI. Breasts: breasts appear normal, symmetrical, no tenderness on exam, no suspicious masses, no skin or nipple changes or  axillary nodes. GYN:  External genitalia within normal limits, normal hair distribution, no lesions. Urethral meatus normal, no lesions. Vaginal mucosa pink, moist, normal rugae, no lesions. No cystocele or rectocele. cervix without lesions, no discharge. Bimanual exam revealed normal uterus.  No bladder/suprapubic fullness, masses or tenderness. No cervical motion tenderness. No adnexal fullness. Anus and perineum within normal limits, no lesions.  No results found.  Assessment/plan: Elbony Mcclimans is a 20 y.o. female present for CPE with right lower quadrant abdominal discomfort on exam. Lipid screening - Lipid panel; Future Screening for deficiency anemia - CBC with Differential/Platelet; Future Need for hepatitis C screening test - Hepatitis C antibody; Future Encounter for screening for HIV - HIV antibody (with reflex); Future Cervical cancer screening/Screen for STD (sexually transmitted disease) - Cytology - PAP( [place]) completed today with HPV reflex. -Gonorrhea, chlamydia and trichomonas testing ordered.  RLQ abdominal pain/irregular menses -Discussed differential diagnosis with her today including: Appendicitis, colitis, pelvic infection/salpingitis - C-reactive protein; Future - POCT urine pregnancy>negative - cmp, tsh -Depending upon results of labs and imaging study will be ordered for her-either ultrasound  versus CT imaging of pelvis. -Patient understands if symptoms worsen before outpatient work-up can be completed, she is to report to the emergency room for evaluation.  Routine general medical examination at a health care facility Colonoscopy: Family history colon cancer in maternal grandmother.  Routine screening at 45.   Mammogram: No family history routine screening at 40 Cervical cancer screening: Pap, collected today.  With STD testing. Immunizations: tdap uncertain, Influenza (encouraged yearly), HPV uncertain.  [place] immunization record database is  down.  We will need to evaluate if she has had these immunizations once able to view report. Infectious disease screening: HIV and Hep C collected today. Patient was encouraged to exercise greater than 150 minutes a week. Patient was encouraged to choose a diet filled with fresh fruits and vegetables, and lean meats. AVS provided to patient today for education/recommendation on gender specific health and safety maintenance.  Return in about 1 year (around 10/19/2021) for CPE (30 min).   Orders Placed This Encounter  Procedures   CBC with Differential/Platelet   Comprehensive metabolic panel   C-reactive protein   Hepatitis C antibody   HIV antibody (with reflex)   Lipid panel   TSH   POCT urine pregnancy   No orders of the defined types were placed in this encounter.  Referral Orders  No referral(s) requested today     Electronically signed by: Felix Pacini, DO [facility] [place]

## 2020-10-20 NOTE — Telephone Encounter (Signed)
See result note.  

## 2020-10-21 ENCOUNTER — Encounter: Payer: Self-pay | Admitting: Family Medicine

## 2020-10-21 DIAGNOSIS — N926 Irregular menstruation, unspecified: Secondary | ICD-10-CM | POA: Insufficient documentation

## 2020-10-21 DIAGNOSIS — R1031 Right lower quadrant pain: Secondary | ICD-10-CM | POA: Insufficient documentation

## 2020-10-25 LAB — CYTOLOGY - PAP
Chlamydia: NEGATIVE
Comment: NEGATIVE
Comment: NEGATIVE
Comment: NORMAL
Diagnosis: NEGATIVE
Diagnosis: REACTIVE
Neisseria Gonorrhea: NEGATIVE
Trichomonas: POSITIVE — AB

## 2020-10-26 ENCOUNTER — Ambulatory Visit: Payer: 59 | Admitting: Family Medicine

## 2020-10-26 ENCOUNTER — Telehealth: Payer: Self-pay | Admitting: Family Medicine

## 2020-10-26 MED ORDER — METRONIDAZOLE 500 MG PO TABS: 500.0000 mg | ORAL_TABLET | Freq: Two times a day (BID) | ORAL | 0 refills | Status: AC

## 2020-10-26 NOTE — Telephone Encounter (Signed)
Please inform patient the following information: Her Pap test was negative for normal cells or HPV infection. She was negative for chlamydia and gonorrhea.  She is positive for trichomonas.  Trichomonas is a sexually transmitted infection that is past partner to partner.  Many times people do not realize they have this infection.  Sometimes this can cause abdominal discomfort, vaginal irritation, vaginal discharge and in the female partner he usually does not have much in the way of symptoms.  It is treated with metronidazole every 12 hours for 7 days.  I have called this medication in for her.  Both her partner and herself need to be treated before engaging in sexual intercourse or it will be passed back and forth.  Avoid alcohol use while taking this medication.  Can cause severe nausea and vomiting with alcohol.

## 2020-10-26 NOTE — Telephone Encounter (Signed)
LVM for pt to CB regarding results.  

## 2020-10-27 NOTE — Telephone Encounter (Signed)
LVM for pt to CB regarding results.  

## 2020-10-28 NOTE — Telephone Encounter (Signed)
LVM for pt to CB regarding results.  

## 2020-10-31 ENCOUNTER — Encounter: Payer: Self-pay | Admitting: Family Medicine

## 2020-10-31 ENCOUNTER — Other Ambulatory Visit: Payer: Self-pay

## 2020-10-31 ENCOUNTER — Ambulatory Visit (INDEPENDENT_AMBULATORY_CARE_PROVIDER_SITE_OTHER): Payer: 59 | Admitting: Family Medicine

## 2020-10-31 VITALS — BP 99/63 | HR 81 | Temp 98.2°F | Ht 60.0 in | Wt 115.0 lb

## 2020-10-31 DIAGNOSIS — Z114 Encounter for screening for human immunodeficiency virus [HIV]: Secondary | ICD-10-CM

## 2020-10-31 DIAGNOSIS — F418 Other specified anxiety disorders: Secondary | ICD-10-CM | POA: Diagnosis not present

## 2020-10-31 DIAGNOSIS — R1031 Right lower quadrant pain: Secondary | ICD-10-CM

## 2020-10-31 DIAGNOSIS — Z130 Encounter for screening for diseases of the blood and blood-forming organs and certain disorders involving the immune mechanism: Secondary | ICD-10-CM

## 2020-10-31 DIAGNOSIS — Z1159 Encounter for screening for other viral diseases: Secondary | ICD-10-CM

## 2020-10-31 DIAGNOSIS — N926 Irregular menstruation, unspecified: Secondary | ICD-10-CM

## 2020-10-31 DIAGNOSIS — G479 Sleep disorder, unspecified: Secondary | ICD-10-CM

## 2020-10-31 DIAGNOSIS — A599 Trichomoniasis, unspecified: Secondary | ICD-10-CM

## 2020-10-31 DIAGNOSIS — Z13220 Encounter for screening for lipoid disorders: Secondary | ICD-10-CM

## 2020-10-31 MED ORDER — QUETIAPINE FUMARATE 50 MG PO TABS: 50.0000 mg | ORAL_TABLET | Freq: Every day | ORAL | 1 refills | Status: DC

## 2020-10-31 NOTE — Patient Instructions (Signed)
Follow up in 4 weeks on new med start.  I will place a referral to psychiatry - they will call to get you scheduled. They will then take over medication.    Managing Anger, Adult Everyone feels angry from time to time. It is okay and normal to feel angry.However, the way that you behave or react to anger can make it a problem. How do problems managing my anger affect me? Reacting too strongly to anger, acting out aggressively, or not expressing it at all can: Create fear in others. Cause others to react with anger as well. Cause others to shut down. Create legal problems. Stress Anger can trigger stress-related problems, such as headaches, poor digestion, or trouble sleeping. Stress can make anger harder to manage. When anger is out of control it is likely to create more stress. Relationships Anger can cause emotional relationship problems at home. Aggressive anger can lead to harming someone else. Anger at work is often unacceptable and may lead to job problems. Unpredictable anger can cause problems with friends. Your health Anger can affect your health. Anger has an impact on: Your mental health. Blood pressure. Heart rate. Hormone production. Anger has been associated with: Migraine headaches. Cardiovascular disease. High blood pressure. Chest pain. What actions can I take to manage anger?     Anger management is not just a matter of staying silent. It involves self awareness of when you are angry and then developing and practicing skills toexpress it clearly and appropriately. Express anger in a healthy way Use the following healthy strategies to help you manage your anger: Step away. When you are feeling reactive, it may take at least 20 minutes for your body to return to its normal blood pressure and heart rate. To help your body do this, take a walk, listen to music, stretch, take deep breaths, and avoid the person or situation that has you feeling angry. Try to discuss  your anger when you feel calm again. Consider how others may feel before you react. Avoid swearing, sighing, raising your voice, or blaming. Realize that you can have the feeling of anger, but you do not have to become the feeling. All feelings happen and usually lessen over time. Choose a good time to work through problems and reach agreement with the other person. You may be more likely to lose your temper at the end of the day when you are tired. The other person may also still be reactive for a while. Set a time and place to come back together that works for both of you. Keep a journal of your feelings. Write down situations in which you become angry. This helps you know your feelings and may help you figure out what triggers your anger. Change the way you think about the situation Change your perception. Consider if there is another way you can view the situation that will leave you with a different emotion. Sometimes, changing the way you think about a situation can make it seem less infuriating. Here are some ways to do that: Remind yourself that everyone is not out to get you. Remind yourself that a disappointing result is not the end of the world. You can cope with being disappointed. Take steps to solve or prevent the situation that upsets you. Find the humor in an aggravating situation. Deal with the physical effects of anger by taking deep breaths, exercising, or taking a walk. Practice letting go of whatever is making you angry. Realize that many things that may  anger you are outside of your control and you cannot do anything about them. Picture a relaxing image in your mind. Close your eyes and use that image to help you calm yourself. How to recognize anger-related problems Anger becomes a problem if it occurs frequently and lasts for long periods of time. You may also need help managing your anger if: You use physical force or aggression when you are angry and others around you feel  threatened and fearful. You feel that your anger is out of control. Anger is interfering with your job. Anger is causing problems with your health. Anger is causing problems with your relationships. Anger is affecting your ability to tolerate normal daily situations, such as sitting in a traffic jam or waiting in line. You do not trust people around you. Follow these instructions at home: Stop and think before you react. Count to 10 before you speak. Let your loved ones know you are struggling with this and that you are going to seek help. Talk to people you trust and ask them for help. They can also help you find someone who can coach you to react differently. Where to find support To find support on how to manage your anger: Call your health care provider for a referral to a mental health professional. Follow up by calling and making an appointment. Look online to find a psychologist who specializes in anger management. Look online for a local office of the Domestic Abuse Project. Your local hospital or behavioral counselors in your area may also offer angermanagement programs or support groups that can help. Where to find more information American Psychological Association: DiceTournament.ca Substance Abuse and Mental Health Services Administration: SkateOasis.com.pt Help Guide: www.helpguide.org [employer] on Domestic Violence: RentalRefinancing.at Contact a health care provider if: You are unable to control your anger. You feel depressed. Your anger interferes with your ability to function at home, at work, at school, or with friends. You have physical symptoms of anger or stress such as headaches, digestion problems, or trouble sleeping. Get help right away if: You may be a danger to yourself or others. If you ever feel like you may hurt yourself or others, or have thoughts about taking your own life, get help right away. Go to your nearest emergency department or: Call your local  emergency services (911 in the [place] Call a suicide crisis helpline, such as the National Suicide Prevention Lifeline at 980-446-2769. This is open 24 hours a day in the [place] Text the Crisis Text Line at 631-696-6982 (in the [place] Summary It is normal for everyone to feel angry at times. However, anger becomes a problem if it occurs frequently and lasts for long periods of time. Health and relationship problems can develop when you react too strongly to anger, act out in anger, or do not express your anger at all. You can use strategies to help you express your anger in a healthy way. Learn to change your perception. Sometimes, changing the way you think about a situation can make it seem less infuriating. Contact your health care provider or a mental health professional if you need help managing your anger. This information is not intended to replace advice given to you by your health care provider. Make sure you discuss any questions you have with your healthcare provider. Document Revised: 08/07/2019 Document Reviewed: 08/07/2019 Elsevier Patient Education  2022 [employer].
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
## 2020-11-01 ENCOUNTER — Telehealth: Payer: Self-pay | Admitting: Family Medicine

## 2020-11-01 DIAGNOSIS — B20 Human immunodeficiency virus [HIV] disease: Secondary | ICD-10-CM | POA: Insufficient documentation

## 2020-11-01 HISTORY — DX: Human immunodeficiency virus (HIV) disease: B20

## 2020-11-01 LAB — COMPREHENSIVE METABOLIC PANEL
AG Ratio: 2 (calc) (ref 1.0–2.5)
ALT: 20 U/L (ref 6–29)
AST: 18 U/L (ref 10–30)
Albumin: 4.5 g/dL (ref 3.6–5.1)
Alkaline phosphatase (APISO): 48 U/L (ref 31–125)
BUN: 10 mg/dL (ref 7–25)
CO2: 20 mmol/L (ref 20–32)
Calcium: 9.3 mg/dL (ref 8.6–10.2)
Chloride: 103 mmol/L (ref 98–110)
Creat: 0.56 mg/dL (ref 0.50–0.96)
Globulin: 2.3 g/dL (calc) (ref 1.9–3.7)
Glucose, Bld: 76 mg/dL (ref 65–99)
Potassium: 4.1 mmol/L (ref 3.5–5.3)
Sodium: 136 mmol/L (ref 135–146)
Total Bilirubin: 0.4 mg/dL (ref 0.2–1.2)
Total Protein: 6.8 g/dL (ref 6.1–8.1)

## 2020-11-01 LAB — CBC WITH DIFFERENTIAL/PLATELET
Absolute Monocytes: 567 cells/uL (ref 200–950)
Basophils Absolute: 27 cells/uL (ref 0–200)
Basophils Relative: 0.3 %
Eosinophils Absolute: 27 cells/uL (ref 15–500)
Eosinophils Relative: 0.3 %
HCT: 38.6 % (ref 35.0–45.0)
Hemoglobin: 12.9 g/dL (ref 11.7–15.5)
Lymphs Abs: 2916 cells/uL (ref 850–3900)
MCH: 29.3 pg (ref 27.0–33.0)
MCHC: 33.4 g/dL (ref 32.0–36.0)
MCV: 87.7 fL (ref 80.0–100.0)
MPV: 9 fL (ref 7.5–12.5)
Monocytes Relative: 6.3 %
Neutro Abs: 5463 cells/uL (ref 1500–7800)
Neutrophils Relative %: 60.7 %
Platelets: 282 10*3/uL (ref 140–400)
RBC: 4.4 10*6/uL (ref 3.80–5.10)
RDW: 13.6 % (ref 11.0–15.0)
Total Lymphocyte: 32.4 %
WBC: 9 10*3/uL (ref 3.8–10.8)

## 2020-11-01 LAB — C-REACTIVE PROTEIN: CRP: 3.2 mg/L (ref <8.0)

## 2020-11-01 LAB — HEPATITIS C ANTIBODY
Hepatitis C Ab: NONREACTIVE
SIGNAL TO CUT-OFF: 0.01 (ref <1.00)

## 2020-11-01 LAB — LIPID PANEL
Cholesterol: 184 mg/dL (ref <200)
HDL: 60 mg/dL (ref >=50)
LDL Cholesterol (Calc): 108 mg/dL (calc) — ABNORMAL HIGH
Non-HDL Cholesterol (Calc): 124 mg/dL (calc) (ref <130)
Total CHOL/HDL Ratio: 3.1 (calc) (ref <5.0)
Triglycerides: 69 mg/dL (ref <150)

## 2020-11-01 LAB — TSH: TSH: 2.34 mIU/L

## 2020-11-01 LAB — HIV-1/2 AB - DIFFERENTIATION
HIV-1 antibody: POSITIVE — AB
HIV-2 Ab: NEGATIVE

## 2020-11-01 LAB — HIV ANTIBODY (ROUTINE TESTING W REFLEX): HIV 1&2 Ab, 4th Generation: REACTIVE — AB

## 2020-11-01 NOTE — Telephone Encounter (Signed)
Patient was called this afternoon by this provider and laboratory results reviewed. She also endorses starting the Flagyl for her trichomonas infection. Unfortunately, patient's HIV fourth-generation screening was positive and reflexive HIV antibody differential was positive for HIV-1.  -We discussed the positive results in detail.  She reports she is uncertain when she was exposed, but may have an idea.  We did not go into detail on the exposure during her phone call today.  Of note, she was negative for HIV 08/2018.   -We discussed neck step is getting her to infectious diseases soon as possible.  We briefly discussed HIV being a treatable disease now. -We discussed abstinence and safe sex. -We discussed her needing to talk with her significant other and testing.  Patient was given the opportunity to ask questions.  She currently did not have any questions.  She was encouraged to write them down if she thinks of any and she is more than welcome to call us back or save them for infectious disease specialist.

## 2020-11-02 ENCOUNTER — Telehealth: Payer: Self-pay | Admitting: Family Medicine

## 2020-11-02 ENCOUNTER — Encounter: Payer: Self-pay | Admitting: Family Medicine

## 2020-11-02 NOTE — Telephone Encounter (Signed)
Received a phone call from [facility] health department concerning patient's positive HIV screening.  Alyssa Sandoval) called to discuss positive HIV test and inquire if patient had been contacted with results yet. Patient was contacted yesterday, is aware for HIV status and has been referred to infectious disease at [facility].  [facility] health department will be contacting patient to discuss patient contact tracing.  Electronically Signed by: Felix Pacini, DO [facility]- [place]

## 2020-11-04 ENCOUNTER — Telehealth: Payer: Self-pay

## 2020-11-04 ENCOUNTER — Other Ambulatory Visit (HOSPITAL_COMMUNITY): Payer: Self-pay

## 2020-11-04 NOTE — Telephone Encounter (Signed)
[facility] Patient Product/process development scientist completed.    The patient is insured through [employer]) and has a $703.00 copay.  Patient will need to see Artist .  We will continue to follow to see if copay assistance is needed.  Clearance Coots, CPhT Specialty Pharmacy Patient [facility] for Infectious Disease Phone: [phone] Fax:  [phone]

## 2020-11-07 ENCOUNTER — Telehealth: Payer: Self-pay

## 2020-11-07 ENCOUNTER — Other Ambulatory Visit (HOSPITAL_COMMUNITY): Payer: Self-pay

## 2020-11-07 ENCOUNTER — Encounter: Payer: Self-pay | Admitting: Infectious Disease

## 2020-11-07 ENCOUNTER — Ambulatory Visit (INDEPENDENT_AMBULATORY_CARE_PROVIDER_SITE_OTHER): Payer: 59 | Admitting: Infectious Disease

## 2020-11-07 ENCOUNTER — Other Ambulatory Visit: Payer: Self-pay

## 2020-11-07 VITALS — BP 117/85 | HR 87 | Temp 98.0°F | Wt 115.0 lb

## 2020-11-07 DIAGNOSIS — R45851 Suicidal ideations: Secondary | ICD-10-CM | POA: Diagnosis not present

## 2020-11-07 DIAGNOSIS — Z113 Encounter for screening for infections with a predominantly sexual mode of transmission: Secondary | ICD-10-CM | POA: Diagnosis not present

## 2020-11-07 DIAGNOSIS — F329 Major depressive disorder, single episode, unspecified: Secondary | ICD-10-CM

## 2020-11-07 DIAGNOSIS — B20 Human immunodeficiency virus [HIV] disease: Secondary | ICD-10-CM | POA: Diagnosis not present

## 2020-11-07 DIAGNOSIS — F332 Major depressive disorder, recurrent severe without psychotic features: Secondary | ICD-10-CM | POA: Diagnosis not present

## 2020-11-07 HISTORY — DX: Suicidal ideations: R45.851

## 2020-11-07 HISTORY — DX: Major depressive disorder, single episode, unspecified: F32.9

## 2020-11-07 MED ORDER — BIKTARVY 50-200-25 MG PO TABS
1.0000 | ORAL_TABLET | Freq: Every day | ORAL | 11 refills | Status: DC
  Filled 2020-11-07: qty 30, 30d supply, fill #0

## 2020-11-07 NOTE — Telephone Encounter (Signed)
Medication Samples have been provided to the patient.  Drug name: Biktarvy        Strength: 50/200/25 mg       Qty: 28 (4 Bottles)   LOT: [id number]   Exp.Date: 06/24  Dosing instructions: Take one tablet by mouth once daily  Clearance Coots, CPhT Specialty Pharmacy Patient [facility] for Infectious Disease Phone: [phone] Fax:  [phone]
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
## 2020-11-08 LAB — T-HELPER CELL (CD4) - (RCID CLINIC ONLY)
CD4 % Helper T Cell: 34 % (ref 33–65)
CD4 T Cell Abs: 566 /uL (ref 400–1790)

## 2020-11-10 ENCOUNTER — Ambulatory Visit: Payer: 59

## 2020-11-10 ENCOUNTER — Other Ambulatory Visit: Payer: Self-pay

## 2020-11-15 LAB — COMPLETE METABOLIC PANEL WITH GFR
AG Ratio: 2 (calc) (ref 1.0–2.5)
ALT: 7 U/L (ref 6–29)
AST: 13 U/L (ref 10–30)
Albumin: 4.5 g/dL (ref 3.6–5.1)
Alkaline phosphatase (APISO): 45 U/L (ref 31–125)
BUN: 9 mg/dL (ref 7–25)
CO2: 23 mmol/L (ref 20–32)
Calcium: 9.1 mg/dL (ref 8.6–10.2)
Chloride: 106 mmol/L (ref 98–110)
Creat: 0.51 mg/dL (ref 0.50–0.96)
Globulin: 2.3 g/dL (calc) (ref 1.9–3.7)
Glucose, Bld: 91 mg/dL (ref 65–99)
Potassium: 4.1 mmol/L (ref 3.5–5.3)
Sodium: 137 mmol/L (ref 135–146)
Total Bilirubin: 0.2 mg/dL (ref 0.2–1.2)
Total Protein: 6.8 g/dL (ref 6.1–8.1)
eGFR: 136 mL/min/{1.73_m2} (ref >=60)

## 2020-11-15 LAB — CBC WITH DIFFERENTIAL/PLATELET
Absolute Monocytes: 399 cells/uL (ref 200–950)
Basophils Absolute: 42 cells/uL (ref 0–200)
Basophils Relative: 1 %
Eosinophils Absolute: 50 cells/uL (ref 15–500)
Eosinophils Relative: 1.2 %
HCT: 36.4 % (ref 35.0–45.0)
Hemoglobin: 12.2 g/dL (ref 11.7–15.5)
Lymphs Abs: 1684 cells/uL (ref 850–3900)
MCH: 29 pg (ref 27.0–33.0)
MCHC: 33.5 g/dL (ref 32.0–36.0)
MCV: 86.7 fL (ref 80.0–100.0)
MPV: 8.9 fL (ref 7.5–12.5)
Monocytes Relative: 9.5 %
Neutro Abs: 2024 cells/uL (ref 1500–7800)
Neutrophils Relative %: 48.2 %
Platelets: 315 10*3/uL (ref 140–400)
RBC: 4.2 10*6/uL (ref 3.80–5.10)
RDW: 13.6 % (ref 11.0–15.0)
Total Lymphocyte: 40.1 %
WBC: 4.2 10*3/uL (ref 3.8–10.8)

## 2020-11-15 LAB — RPR: RPR Ser Ql: NONREACTIVE

## 2020-11-15 LAB — HIV-1 INTEGRASE GENOTYPE

## 2020-11-15 LAB — HEPATITIS PANEL, ACUTE
Hep A IgM: NONREACTIVE
Hep B C IgM: NONREACTIVE
Hepatitis B Surface Ag: NONREACTIVE
Hepatitis C Ab: NONREACTIVE
SIGNAL TO CUT-OFF: 0.01 (ref <1.00)

## 2020-11-15 LAB — LIPID PANEL
Cholesterol: 181 mg/dL (ref <200)
HDL: 49 mg/dL — ABNORMAL LOW (ref >=50)
LDL Cholesterol (Calc): 117 mg/dL (calc) — ABNORMAL HIGH
Non-HDL Cholesterol (Calc): 132 mg/dL (calc) — ABNORMAL HIGH (ref <130)
Total CHOL/HDL Ratio: 3.7 (calc) (ref <5.0)
Triglycerides: 54 mg/dL (ref <150)

## 2020-11-15 LAB — HIV RNA, RTPCR W/R GT (RTI, PI,INT)
HIV 1 RNA Quant: 3460 copies/mL — ABNORMAL HIGH
HIV-1 RNA Quant, Log: 3.54 Log copies/mL — ABNORMAL HIGH

## 2020-11-15 LAB — QUANTIFERON-TB GOLD PLUS
Mitogen-NIL: >10 IU/mL
NIL: 0.03 IU/mL
QuantiFERON-TB Gold Plus: NEGATIVE
TB1-NIL: 0.12 IU/mL
TB2-NIL: 0.03 IU/mL

## 2020-11-15 LAB — HIV-1 GENOTYPE: HIV-1 Genotype: DETECTED — AB

## 2020-11-15 LAB — HLA B*5701: HLA-B*5701 w/rflx HLA-B High: NEGATIVE

## 2020-11-24 ENCOUNTER — Ambulatory Visit: Payer: 59

## 2020-12-09 ENCOUNTER — Encounter: Payer: Self-pay | Admitting: Infectious Disease

## 2020-12-09 ENCOUNTER — Other Ambulatory Visit: Payer: Self-pay | Admitting: Infectious Disease

## 2020-12-09 ENCOUNTER — Other Ambulatory Visit: Payer: Self-pay | Admitting: Pharmacist

## 2020-12-09 ENCOUNTER — Other Ambulatory Visit (HOSPITAL_COMMUNITY): Payer: Self-pay

## 2020-12-09 ENCOUNTER — Ambulatory Visit (INDEPENDENT_AMBULATORY_CARE_PROVIDER_SITE_OTHER): Payer: 59 | Admitting: Infectious Disease

## 2020-12-09 ENCOUNTER — Other Ambulatory Visit: Payer: Self-pay

## 2020-12-09 VITALS — BP 98/63 | HR 82 | Resp 16 | Ht 60.0 in | Wt 119.0 lb

## 2020-12-09 DIAGNOSIS — Z23 Encounter for immunization: Secondary | ICD-10-CM | POA: Diagnosis not present

## 2020-12-09 DIAGNOSIS — R519 Headache, unspecified: Secondary | ICD-10-CM | POA: Diagnosis not present

## 2020-12-09 DIAGNOSIS — B20 Human immunodeficiency virus [HIV] disease: Secondary | ICD-10-CM

## 2020-12-09 DIAGNOSIS — F332 Major depressive disorder, recurrent severe without psychotic features: Secondary | ICD-10-CM | POA: Diagnosis not present

## 2020-12-09 DIAGNOSIS — M545 Low back pain, unspecified: Secondary | ICD-10-CM

## 2020-12-09 HISTORY — DX: Low back pain, unspecified: M54.50

## 2020-12-09 HISTORY — DX: Headache, unspecified: R51.9

## 2020-12-09 MED ORDER — QUETIAPINE FUMARATE 50 MG PO TABS
50.0000 mg | ORAL_TABLET | Freq: Every day | ORAL | 3 refills | Status: DC
  Filled 2020-12-09: qty 90, 90d supply, fill #0

## 2020-12-09 MED ORDER — DULOXETINE HCL 20 MG PO CPEP
ORAL_CAPSULE | ORAL | 5 refills | Status: DC
  Filled 2020-12-09: qty 60, fill #0

## 2020-12-09 MED ORDER — BICTEGRAVIR-EMTRICITAB-TENOFOV 50-200-25 MG PO TABS: 1.0000 | ORAL_TABLET | Freq: Every day | ORAL | 0 refills | Status: AC

## 2020-12-09 MED ORDER — BIKTARVY 50-200-25 MG PO TABS
1.0000 | ORAL_TABLET | Freq: Every day | ORAL | 11 refills | Status: DC
  Filled 2020-12-09 (×2): qty 30, 30d supply, fill #0

## 2020-12-09 NOTE — Progress Notes (Signed)
Subjective:   Chief complaint: followup for HIV disease on medications he does have headaches at times that she does not think they are related to [name]   Patient ID: Alyssa Sandoval, female    DOB: 23-Aug-2000, 20 y.o.   MRN: [id number]  HPI   Alyssa Sandoval is a 20 year old Black woman recently diagnosed with HIV who restarted on Biktarvy.  Is tolerating medication without problems whatsoever.  She does suffer from comorbid depression and this is worsened in the context of her new diagnosis of HIV.  Her depressive mood is better compared to when I saw her last in clinic.  She denies any suicidal ideation passive or active.  She is contracted for safety.  She is interested in starting antidepressant therapy and I am going to initiate her on Cymbalta.  She and her friend both came to clinic today though we had them in separate rooms further visits.      Past Medical History:  Diagnosis Date   Chronic constipation 06/10/2012   COVID-19 04/2020   Dysfunctional uterine bleeding    GERD (gastroesophageal reflux disease) 06/10/2012   Headache 12/09/2020   Low back pain 12/09/2020   Passive suicidal ideations 11/07/2020   Vaginal yeast infection     Past Surgical History:  Procedure Laterality Date   NO PAST SURGERIES      Family History  Problem Relation Age of Onset   Hypertension Maternal Grandmother    Colon cancer Maternal Grandfather    Fibromyalgia Mother       Social History   Socioeconomic History   Marital status: Single    Spouse name: Not on file   Number of children: Not on file   Years of education: Not on file   Highest education level: Not on file  Occupational History   Not on file  Tobacco Use   Smoking status: Every Day    Types: Cigars   Smokeless tobacco: Never  Vaping Use   Vaping Use: Not on file  Substance and Sexual Activity   Alcohol use: No   Drug use: Yes    Types: Marijuana   Sexual activity: Not Currently    Partners: Male    Birth  control/protection: Injection    Comment: declined condoms  Other Topics Concern   Not on file  Social History Narrative   Marital status/children/pets: Single.    Education/employment: Psychologist, occupational      Social Determinants of Corporate investment banker Strain: Not on file  Food Insecurity: Not on file  Transportation Needs: Not on file  Physical Activity: Not on file  Stress: Not on file  Social Connections: Not on file    No Known Allergies   Current Outpatient Medications:    DULoxetine (CYMBALTA) 20 MG capsule, 1 tablet daily x 2 weeks then 2 tablets daily, Disp: 60 capsule, Rfl: 5   bictegravir-emtricitabine-tenofovir AF (BIKTARVY) 50-200-25 MG TABS tablet, Take 1 tablet by mouth daily., Disp: 30 tablet, Rfl: 11   bictegravir-emtricitabine-tenofovir AF (BIKTARVY) 50-200-25 MG TABS tablet, Take 1 tablet by mouth daily for 28 days., Disp: 28 tablet, Rfl: 0   QUEtiapine (SEROQUEL) 50 MG tablet, Take 1 tablet (50 mg total) by mouth at bedtime., Disp: 90 tablet, Rfl: 3   Review of Systems  Constitutional:  Negative for activity change, appetite change, chills, diaphoresis, fatigue, fever and unexpected weight change.  HENT:  Negative for congestion, rhinorrhea, sinus pressure, sneezing, sore throat and trouble swallowing.   Eyes:  Negative for photophobia and  visual disturbance.  Respiratory:  Negative for cough, chest tightness, shortness of breath, wheezing and stridor.   Cardiovascular:  Negative for chest pain, palpitations and leg swelling.  Gastrointestinal:  Negative for abdominal distention, abdominal pain, anal bleeding, blood in stool, constipation, diarrhea, nausea and vomiting.  Genitourinary:  Negative for difficulty urinating, dysuria, flank pain and hematuria.  Musculoskeletal:  Negative for arthralgias, back pain, gait problem, joint swelling and myalgias.  Skin:  Negative for color change, pallor, rash and wound.  Neurological:  Positive for headaches.  Negative for dizziness, tremors, weakness and light-headedness.  Hematological:  Negative for adenopathy. Does not bruise/bleed easily.  Psychiatric/Behavioral:  Positive for dysphoric mood. Negative for agitation, behavioral problems, confusion, decreased concentration, self-injury and sleep disturbance. The patient is nervous/anxious.       Objective:   Physical Exam Constitutional:      General: She is not in acute distress.    Appearance: Normal appearance. She is well-developed. She is not ill-appearing or diaphoretic.  HENT:     Head: Normocephalic and atraumatic.     Right Ear: Hearing and external ear normal.     Left Ear: Hearing and external ear normal.     Nose: No nasal deformity or rhinorrhea.  Eyes:     General: No scleral icterus.    Conjunctiva/sclera: Conjunctivae normal.     Right eye: Right conjunctiva is not injected.     Left eye: Left conjunctiva is not injected.     Pupils: Pupils are equal, round, and reactive to light.  Neck:     Vascular: No JVD.  Cardiovascular:     Rate and Rhythm: Normal rate and regular rhythm.     Heart sounds: Normal heart sounds, S1 normal and S2 normal. No murmur heard.   No friction rub.  Abdominal:     General: Bowel sounds are normal. There is no distension.     Palpations: Abdomen is soft.     Tenderness: There is no abdominal tenderness.  Musculoskeletal:        General: Normal range of motion.     Right shoulder: Normal.     Left shoulder: Normal.     Cervical back: Normal range of motion and neck supple.     Right hip: Normal.     Left hip: Normal.     Right knee: Normal.     Left knee: Normal.  Lymphadenopathy:     Head:     Right side of head: No submandibular, preauricular or posterior auricular adenopathy.     Left side of head: No submandibular, preauricular or posterior auricular adenopathy.     Cervical: No cervical adenopathy.     Right cervical: No superficial or deep cervical adenopathy.    Left  cervical: No superficial or deep cervical adenopathy.  Skin:    General: Skin is warm and dry.     Coloration: Skin is not pale.     Findings: No abrasion, bruising, ecchymosis, erythema, lesion or rash.     Nails: There is no clubbing.  Neurological:     General: No focal deficit present.     Mental Status: She is alert and oriented to person, place, and time.     Sensory: No sensory deficit.     Coordination: Coordination normal.     Gait: Gait normal.  Psychiatric:        Attention and Perception: Attention normal. She is attentive.        Mood and Affect: Mood normal.  Speech: Speech normal.        Behavior: Behavior normal. Behavior is cooperative.        Thought Content: Thought content normal.        Cognition and Memory: Cognition and memory normal.        Judgment: Judgment normal.          Assessment & Plan:   HIV disease: I reviewed her viral load from November 07, 2020 which was 3468 genotype showed no evidence of resistance to any of the antiviral therapies  We will continue BIKTARVY and I am checking HIV viral load and CD4 count today  Depression: active. I have recommended engagement with counselor and I am starting Cymbalta  Headaches: she does believe that they are related to her BIKTARVY though I do not think she was complaining about at last visit.  She wants to stay on current therapy.  Exam counseling we gave her vaccination against pneumonia with Prevnar 13.  She also needs hepatitis A and B vaccinations but she only want to have 1 shot today.

## 2020-12-09 NOTE — Progress Notes (Deleted)
Subjective:  Reason for Consult: newly diagnosed HIV  Requesting Physician: Alyssa Few, DO    Patient ID: Alyssa Sandoval, female    DOB: [birth year], 20 y.o.   MRN: [id number]  Alyssa Sandoval is a 20 year old black woman recently diagnosed with HIV by fourth-generation testing.  She also was positive for bacterial vaginosis.  She tested negative for HIV in 2020 in May.  Her past medical history significant for COVID-19 infection and some problems with dysfunctional uterine bleeding.  She also suffers some comorbid depression at times with passive suicidal ideation.  She sees Alyssa Sandoval for Primary Care she is going to be established with a psychiatrist as well   Comes to today's visit with her boyfriend as well as her mother.  Note we did also see her boyfriend for HIV testing and treatment worried to be positive.  Alyssa Sandoval has suffered from significant depressive symptoms and when I asked her about passive suicidal ideation she says she has had this at times.  I asked her if she has ever tried to hurt herself and she has had last week she had struck her self several times with her fist.  She has no plan for suicide and has made no suicide attempts   Past Medical History:  Diagnosis Date   Chronic constipation 06/10/2012   COVID-19 04/2020   Dysfunctional uterine bleeding    GERD (gastroesophageal reflux disease) 06/10/2012   Passive suicidal ideations 11/07/2020   Vaginal yeast infection     Past Surgical History:  Procedure Laterality Date   NO PAST SURGERIES      Family History  Problem Relation Age of Onset   Hypertension Maternal Grandmother    Colon cancer Maternal Grandfather    Fibromyalgia Mother       Social History   Socioeconomic History   Marital status: Single    Spouse name: Not on file   Number of children: Not on file   Years of education: Not on file   Highest education level: Not on file  Occupational History   Not on file  Tobacco Use   Smoking  status: Every Day    Types: Cigars   Smokeless tobacco: Never  Vaping Use   Vaping Use: Not on file  Substance and Sexual Activity   Alcohol use: No   Drug use: Yes    Types: Marijuana   Sexual activity: Not Currently    Partners: Male    Birth control/protection: Injection    Comment: declined condoms  Other Topics Concern   Not on file  Social History Narrative   Marital status/children/pets: Single.    Education/employment: Psychologist, occupational      Social Determinants of Health   Financial Resource Strain: Not on file  Food Insecurity: Not on file  Transportation Needs: Not on file  Physical Activity: Not on file  Stress: Not on file  Social Connections: Not on file    No Known Allergies   Current Outpatient Medications:    bictegravir-emtricitabine-tenofovir AF (BIKTARVY) 50-200-25 MG TABS tablet, Take 1 tablet by mouth daily., Disp: 30 tablet, Rfl: 11   QUEtiapine (SEROQUEL) 50 MG tablet, Take 1 tablet (50 mg total) by mouth at bedtime., Disp: 90 tablet, Rfl: 1   Review of Systems  Constitutional:  Negative for chills and fever.  HENT:  Negative for congestion and sore throat.   Eyes:  Negative for photophobia.  Respiratory:  Negative for cough, shortness of breath and wheezing.   Cardiovascular:  Negative for chest pain,  palpitations and leg swelling.  Gastrointestinal:  Negative for abdominal pain, blood in stool, constipation, diarrhea, nausea and vomiting.  Genitourinary:  Negative for dysuria, flank pain and hematuria.  Musculoskeletal:  Negative for back pain and myalgias.  Skin:  Negative for rash.  Neurological:  Negative for dizziness, seizures, speech difficulty, weakness, light-headedness, numbness and headaches.  Hematological:  Does not bruise/bleed easily.  Psychiatric/Behavioral:  Positive for dysphoric mood, self-injury, sleep disturbance and suicidal ideas. Negative for confusion and hallucinations. The patient is nervous/anxious.        Objective:   Physical Exam Constitutional:      General: She is not in acute distress.    Appearance: Normal appearance. She is well-developed. She is not ill-appearing or diaphoretic.  HENT:     Head: Normocephalic and atraumatic.     Right Ear: Hearing and external ear normal.     Left Ear: Hearing and external ear normal.     Nose: No nasal deformity or rhinorrhea.  Eyes:     General: No scleral icterus.    Conjunctiva/sclera: Conjunctivae normal.     Right eye: Right conjunctiva is not injected.     Left eye: Left conjunctiva is not injected.     Pupils: Pupils are equal, round, and reactive to light.  Neck:     Vascular: No JVD.  Cardiovascular:     Rate and Rhythm: Normal rate and regular rhythm.     Heart sounds: S1 normal and S2 normal.  Pulmonary:     Effort: Pulmonary effort is normal. No respiratory distress.     Breath sounds: No wheezing.  Abdominal:     General: There is no distension.     Palpations: Abdomen is soft.     Tenderness: There is no abdominal tenderness.  Musculoskeletal:        General: Normal range of motion.     Right shoulder: Normal.     Left shoulder: Normal.     Cervical back: Normal range of motion and neck supple.     Right hip: Normal.     Left hip: Normal.     Right knee: Normal.     Left knee: Normal.  Lymphadenopathy:     Head:     Right side of head: No submandibular, preauricular or posterior auricular adenopathy.     Left side of head: No submandibular, preauricular or posterior auricular adenopathy.     Cervical: No cervical adenopathy.     Right cervical: No superficial or deep cervical adenopathy.    Left cervical: No superficial or deep cervical adenopathy.  Skin:    General: Skin is warm and dry.     Coloration: Skin is not pale.     Findings: No abrasion, bruising, ecchymosis, erythema, lesion or rash.     Nails: There is no clubbing.  Neurological:     General: No focal deficit present.     Mental Status: She is  alert and oriented to person, place, and time.     Sensory: No sensory deficit.     Coordination: Coordination normal.     Gait: Gait normal.  Psychiatric:        Attention and Perception: Attention normal. She is attentive.        Mood and Affect: Mood is anxious and depressed.        Speech: Speech normal.        Behavior: Behavior normal. Behavior is cooperative.        Thought Content: Thought content  normal.        Judgment: Judgment normal.          Assessment & Plan:   Newly diagnosed HIV disease:  Ordering HIV viral load with genotype testing QuantiFERON gold hepatitis panel screening for syphilis CMP CBC with differential lipid panel  We are starting BIKTARVY and I have given her a 28-day supply and she was to take her first dose today in clinic  To her back in 1 month's time.  Depression with anxiety: Has worsened recently.  Fortunately she has a therapeutic relationship with her primary care physician but of also offered to have her see Marylu Lund here and hopefully she can find a very good psychiatrist.  She is contracted for safety with regards to her suicidal ideation  Hx of STI's in the past. It is too bad that she was not initiated on PrEP in the past. We will continue to screen her regularly for STIS  I spent 82 minutes with the patient including  face to face counseling of the patient guarding the nature of HIV the different medications we used to treated the fact that she should have a normal life expectancy should she take her medications with regularity and have a normal qualitative life and also when she has undetectable not transmit the virus to sexual partners or should she have a child nor should she while pregnant,   Personally reviewing radiographs, along with p her recent STI testing including gonorrhea chlamydia testing positive test for trichomonas HIV positive test CBC differential metabolic panel as well as review of her visit with primary care with Dr.  Claiborne Billings other Medical records in preparation for the visit and during the visit and in coordination of her care.

## 2020-12-09 NOTE — Progress Notes (Signed)
Medication Samples have been provided to the patient.  Drug name: Biktarvy        Strength: 50/200/25 mg       Qty: 28 tablets (4 bottles) LOT: CHSYVB   Exp.Date: 06/24  Dosing instructions: Take one tablet by mouth once daily  The patient has been instructed regarding the correct time, dose, and frequency of taking this medication, including desired effects and most common side effects.   Margarite Gouge, PharmD, CPP Clinical Pharmacist Practitioner Infectious Diseases Clinical Pharmacist [facility]  03/21/2020, 10:07 AM

## 2020-12-10 LAB — MICROALBUMIN / CREATININE URINE RATIO
Creatinine, Urine: 83 mg/dL (ref 20–275)
Microalb Creat Ratio: 7 mcg/mg creat (ref <30)
Microalb, Ur: 0.6 mg/dL

## 2020-12-12 LAB — CBC WITH DIFFERENTIAL/PLATELET
Absolute Monocytes: 330 cells/uL (ref 200–950)
Basophils Absolute: 30 cells/uL (ref 0–200)
Basophils Relative: 0.5 %
Eosinophils Absolute: 60 cells/uL (ref 15–500)
Eosinophils Relative: 1 %
HCT: 37.9 % (ref 35.0–45.0)
Hemoglobin: 12.7 g/dL (ref 11.7–15.5)
Lymphs Abs: 2154 cells/uL (ref 850–3900)
MCH: 29.6 pg (ref 27.0–33.0)
MCHC: 33.5 g/dL (ref 32.0–36.0)
MCV: 88.3 fL (ref 80.0–100.0)
MPV: 8.9 fL (ref 7.5–12.5)
Monocytes Relative: 5.5 %
Neutro Abs: 3426 cells/uL (ref 1500–7800)
Neutrophils Relative %: 57.1 %
Platelets: 349 10*3/uL (ref 140–400)
RBC: 4.29 10*6/uL (ref 3.80–5.10)
RDW: 14.3 % (ref 11.0–15.0)
Total Lymphocyte: 35.9 %
WBC: 6 10*3/uL (ref 3.8–10.8)

## 2020-12-12 LAB — T-HELPER CELLS (CD4) COUNT (NOT AT ARMC)
Absolute CD4: 1058 cells/uL (ref 490–1740)
CD4 T Helper %: 48 % (ref 30–61)
Total lymphocyte count: 2204 cells/uL (ref 850–3900)

## 2020-12-12 LAB — HIV-1 RNA QUANT-NO REFLEX-BLD
HIV 1 RNA Quant: <20 Copies/mL — ABNORMAL HIGH
HIV-1 RNA Quant, Log: <1.3 Log cps/mL — ABNORMAL HIGH

## 2020-12-12 LAB — COMPLETE METABOLIC PANEL WITH GFR
AG Ratio: 2 (calc) (ref 1.0–2.5)
ALT: 7 U/L (ref 6–29)
AST: 13 U/L (ref 10–30)
Albumin: 4.4 g/dL (ref 3.6–5.1)
Alkaline phosphatase (APISO): 49 U/L (ref 31–125)
BUN: 14 mg/dL (ref 7–25)
CO2: 26 mmol/L (ref 20–32)
Calcium: 9.5 mg/dL (ref 8.6–10.2)
Chloride: 105 mmol/L (ref 98–110)
Creat: 0.7 mg/dL (ref 0.50–0.96)
Globulin: 2.2 g/dL (calc) (ref 1.9–3.7)
Glucose, Bld: 98 mg/dL (ref 65–99)
Potassium: 4.3 mmol/L (ref 3.5–5.3)
Sodium: 139 mmol/L (ref 135–146)
Total Bilirubin: 0.3 mg/dL (ref 0.2–1.2)
Total Protein: 6.6 g/dL (ref 6.1–8.1)
eGFR: 126 mL/min/{1.73_m2} (ref >=60)

## 2020-12-15 ENCOUNTER — Telehealth: Payer: Self-pay | Admitting: Family Medicine

## 2020-12-15 NOTE — Telephone Encounter (Signed)
Error/disregard

## 2020-12-23 ENCOUNTER — Other Ambulatory Visit: Payer: Self-pay

## 2020-12-23 DIAGNOSIS — F332 Major depressive disorder, recurrent severe without psychotic features: Secondary | ICD-10-CM

## 2020-12-23 DIAGNOSIS — B20 Human immunodeficiency virus [HIV] disease: Secondary | ICD-10-CM

## 2020-12-23 MED ORDER — DULOXETINE HCL 20 MG PO CPEP: ORAL_CAPSULE | ORAL | 5 refills | Status: DC

## 2020-12-23 MED ORDER — BIKTARVY 50-200-25 MG PO TABS: 1.0000 | ORAL_TABLET | Freq: Every day | ORAL | 11 refills | Status: DC

## 2021-02-06 IMAGING — DX CHEST - 2 VIEW
2 series · 2 of 2 positions shown · non-contrast
Comparison: 05/11/2006

CLINICAL DATA: Productive cough.  Shortness of breath.

EXAM:
CHEST - 2 VIEW

[chest pa]
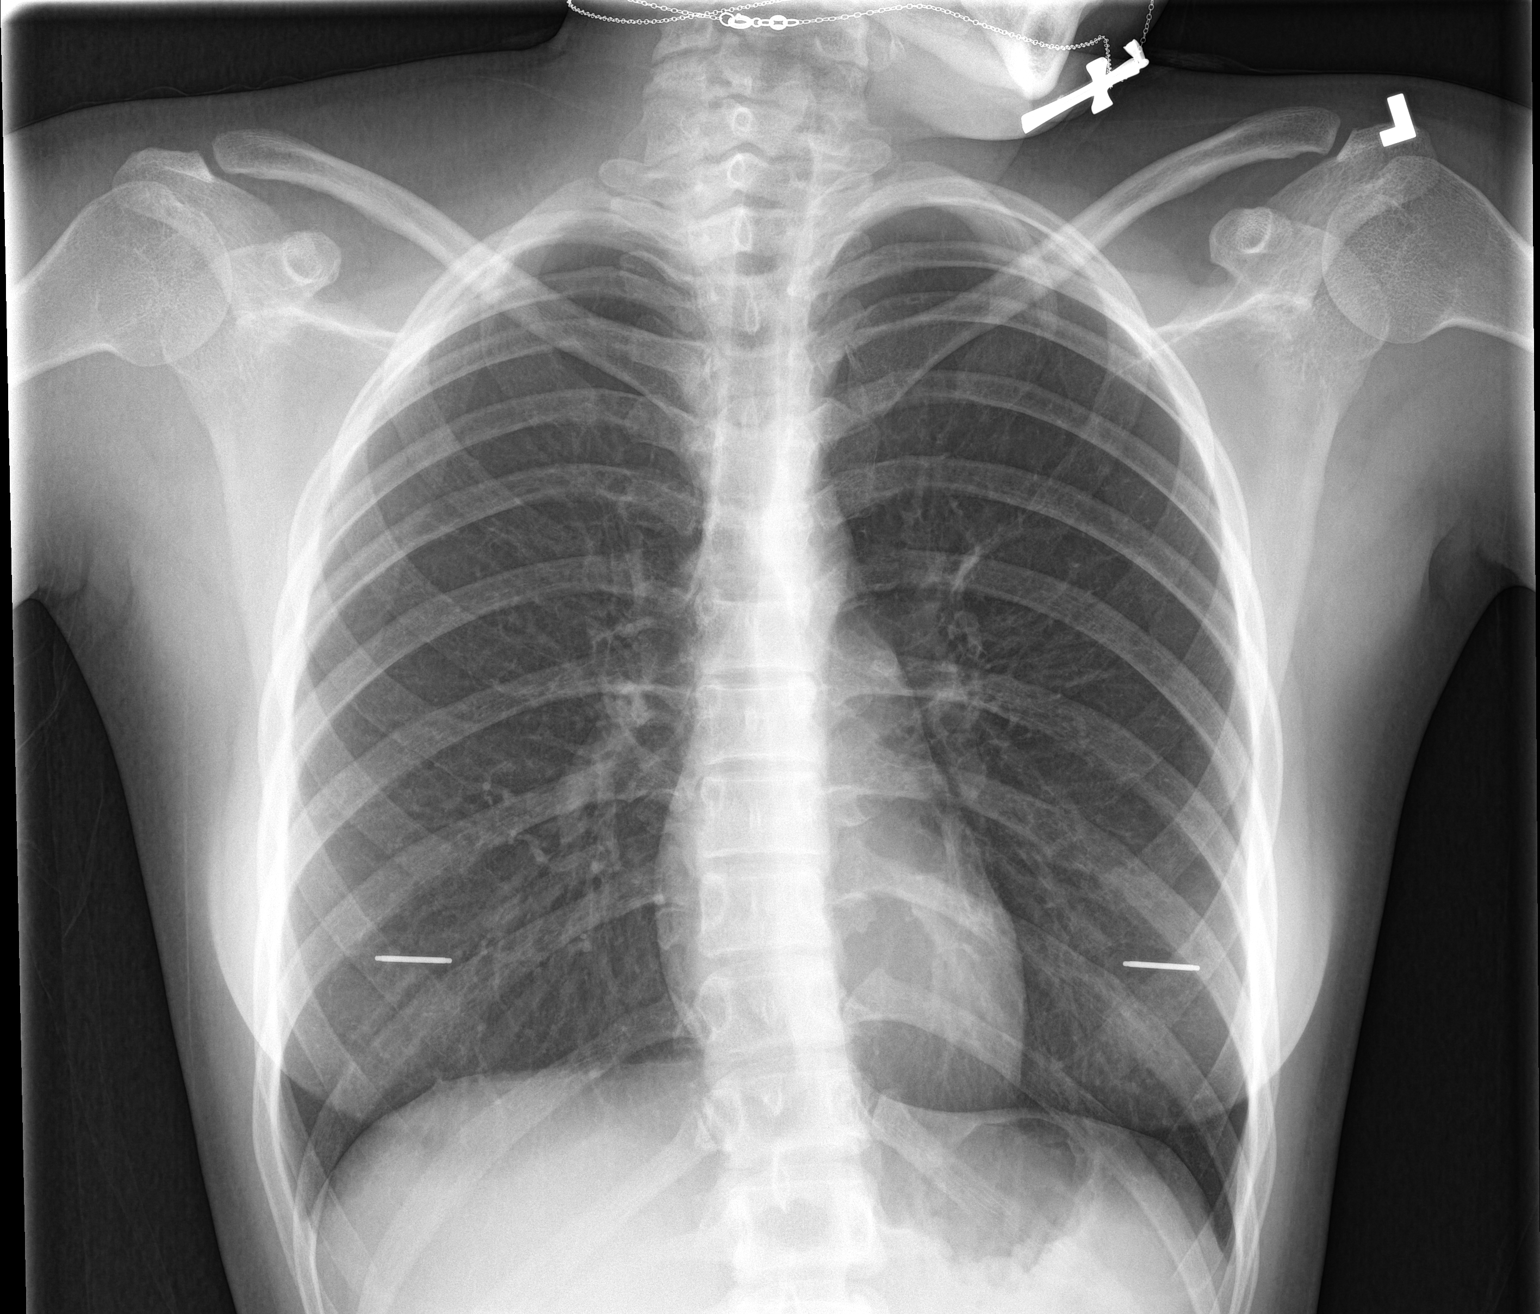

[chest lat]
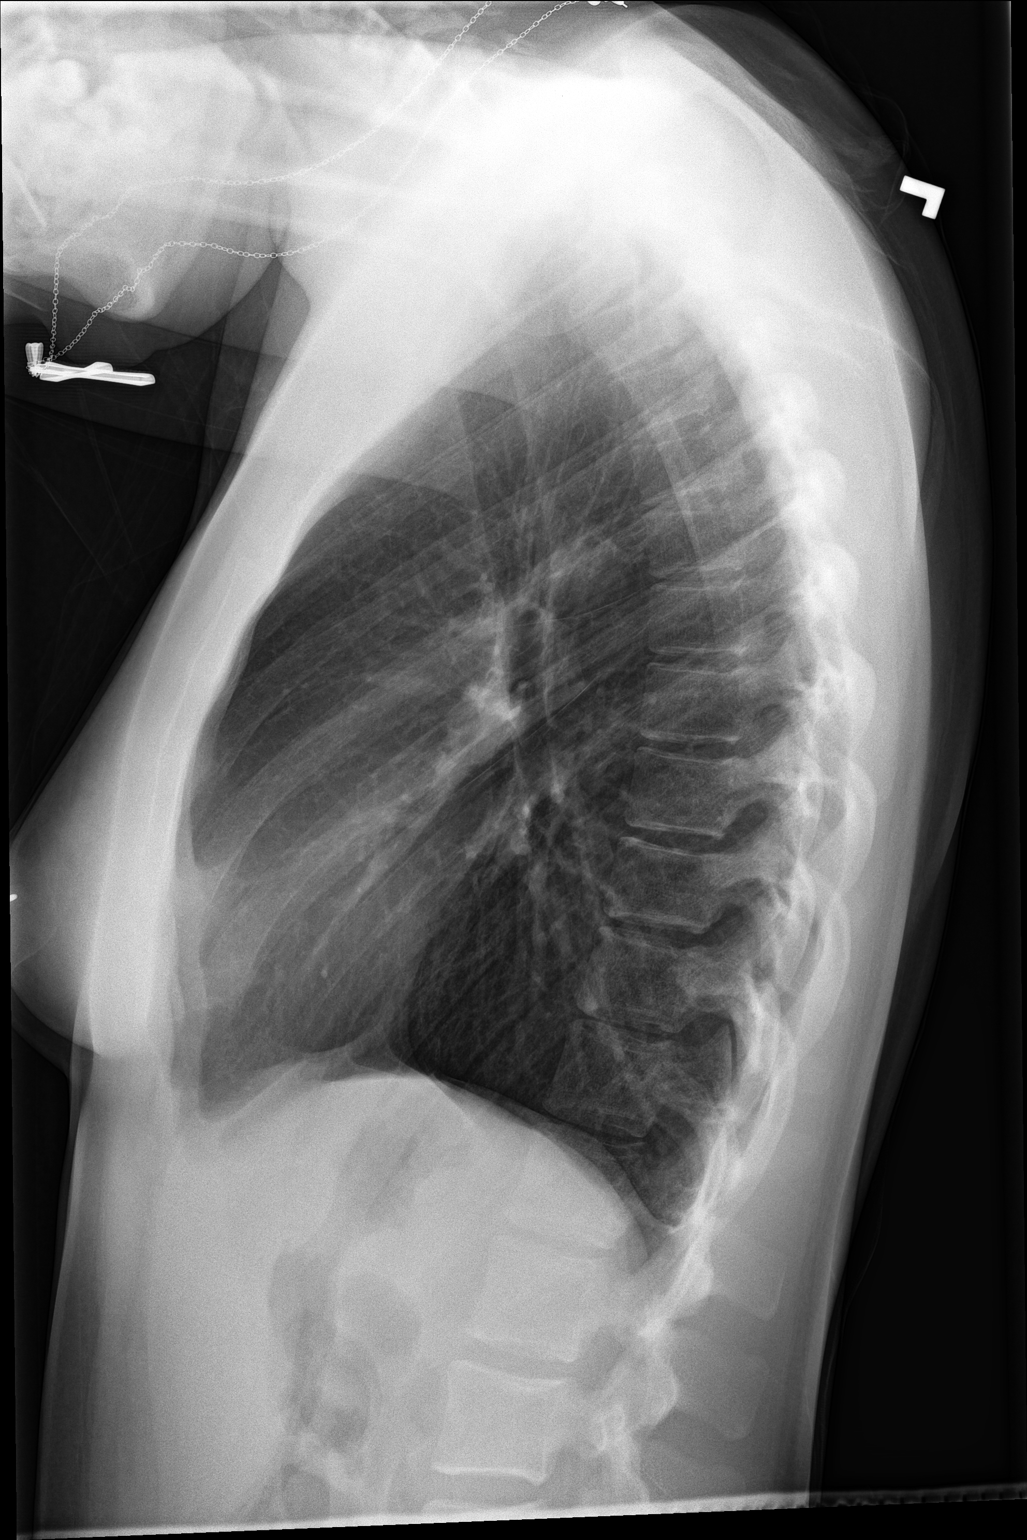

[2 of 2 positions shown; findings below may reference images not displayed]

FINDINGS: The heart size and mediastinal contours are within normal limits.
Both lungs are clear. The visualized skeletal structures are
unremarkable.
IMPRESSION: Normal exam.

## 2021-02-09 ENCOUNTER — Other Ambulatory Visit (HOSPITAL_COMMUNITY): Payer: Self-pay

## 2021-02-13 ENCOUNTER — Ambulatory Visit: Payer: 59 | Admitting: Family Medicine

## 2021-02-17 ENCOUNTER — Ambulatory Visit: Payer: 59 | Admitting: Infectious Disease

## 2021-03-14 ENCOUNTER — Telehealth: Payer: Self-pay

## 2021-03-14 NOTE — Telephone Encounter (Signed)
Patient left voicemail asking if she could switch from the pills to the shot.   Spoke with her and she also mentions vaginal itching, says she has been scratching so much that she now has bumps. She has condomless sex with her boyfriend and is not sure if he's ever been tested for STIs. She complains of discharge and foul odor.   She missed her last appointment with Dr. Daiva Eves and would like to reschedule for tomorrow.   Will route to provider.   Sandie Ano, RN

## 2021-03-15 ENCOUNTER — Other Ambulatory Visit (HOSPITAL_COMMUNITY)
Admission: RE | Admit: 2021-03-15 | Discharge: 2021-03-15 | Disposition: A | Discharge status: Home / Self Care | Payer: 59 | Source: Ambulatory Visit | Attending: Infectious Disease | Admitting: Infectious Disease

## 2021-03-15 ENCOUNTER — Other Ambulatory Visit: Payer: Self-pay

## 2021-03-15 ENCOUNTER — Ambulatory Visit (INDEPENDENT_AMBULATORY_CARE_PROVIDER_SITE_OTHER): Payer: 59 | Admitting: Infectious Disease

## 2021-03-15 ENCOUNTER — Encounter: Payer: Self-pay | Admitting: Infectious Disease

## 2021-03-15 ENCOUNTER — Other Ambulatory Visit (HOSPITAL_COMMUNITY): Payer: Self-pay

## 2021-03-15 VITALS — BP 114/81 | HR 74 | Resp 16 | Ht 60.0 in | Wt 120.9 lb

## 2021-03-15 DIAGNOSIS — B20 Human immunodeficiency virus [HIV] disease: Secondary | ICD-10-CM | POA: Diagnosis present

## 2021-03-15 DIAGNOSIS — Z23 Encounter for immunization: Secondary | ICD-10-CM | POA: Diagnosis not present

## 2021-03-15 DIAGNOSIS — Z113 Encounter for screening for infections with a predominantly sexual mode of transmission: Secondary | ICD-10-CM | POA: Diagnosis not present

## 2021-03-15 DIAGNOSIS — N898 Other specified noninflammatory disorders of vagina: Secondary | ICD-10-CM | POA: Insufficient documentation

## 2021-03-15 DIAGNOSIS — F332 Major depressive disorder, recurrent severe without psychotic features: Secondary | ICD-10-CM

## 2021-03-15 HISTORY — DX: Other specified noninflammatory disorders of vagina: N89.8

## 2021-03-15 MED ORDER — ESCITALOPRAM OXALATE 5 MG PO TABS: 5.0000 mg | ORAL_TABLET | Freq: Every day | ORAL | 4 refills | Status: DC

## 2021-03-15 MED ORDER — FLUCONAZOLE 100 MG PO TABS: 100.0000 mg | ORAL_TABLET | Freq: Every day | ORAL | 0 refills | Status: DC

## 2021-03-15 NOTE — Progress Notes (Signed)
Subjective:   Chief complaints: Vaginal pruritus with discharge depressive symptoms and desire to change to long-acting antiretroviral therapy   Patient ID: Alyssa Sandoval, female    DOB: [birth year], 20 y.o.   MRN: [id number]  HPI   Alyssa Sandoval is a 20 year old Black woman recently diagnosed with HIV which is been perfectly suppressed on Biktarvy.  She has been suffering from significant depressive symptoms at one point in time with passive suicidal ideation.  She has been contracted for safety.  She says she no longer is having passive suicidal ideation she was prescribed Cymbalta but did not tolerate well due to GI upset.  She is also having vaginal irritation and some "bumps that have come up in her labia.  She worries if it is a reaction to her boyfriend's semen.  She is also worried she could have contracted a sexually transmitted infection from him.  She is in the process of trying to disentangle herself from the relationship with her boyfriend.  She says the boyfriend also is very insistent that they take their antiretrovirals together.  She says he is not as consistent taking his medications as her.  She very much would like to go onto long-acting [place]    Past Medical History:  Diagnosis Date   Chronic constipation 06/10/2012   COVID-19 04/2020   Dysfunctional uterine bleeding    GERD (gastroesophageal reflux disease) 06/10/2012   Headache 12/09/2020   Low back pain 12/09/2020   Passive suicidal ideations 11/07/2020   Vaginal yeast infection     Past Surgical History:  Procedure Laterality Date   NO PAST SURGERIES      Family History  Problem Relation Age of Onset   Hypertension Maternal Grandmother    Colon cancer Maternal Grandfather    Fibromyalgia Mother       Social History   Socioeconomic History   Marital status: Single    Spouse name: Not on file   Number of children: Not on file   Years of education: Not on file   Highest education level: Not on file   Occupational History   Not on file  Tobacco Use   Smoking status: Every Day    Types: Cigars   Smokeless tobacco: Never  Vaping Use   Vaping Use: Not on file  Substance and Sexual Activity   Alcohol use: No   Drug use: Yes    Types: Marijuana   Sexual activity: Not Currently    Partners: Male    Birth control/protection: Injection    Comment: declined condoms  Other Topics Concern   Not on file  Social History Narrative   Marital status/children/pets: Single.    Education/employment: Psychologist, occupational      Social Determinants of Health   Financial Resource Strain: Not on file  Food Insecurity: Not on file  Transportation Needs: Not on file  Physical Activity: Not on file  Stress: Not on file  Social Connections: Not on file    No Known Allergies   Current Outpatient Medications:    bictegravir-emtricitabine-tenofovir AF (BIKTARVY) 50-200-25 MG TABS tablet, Take 1 tablet by mouth daily., Disp: 30 tablet, Rfl: 11   QUEtiapine (SEROQUEL) 50 MG tablet, Take 1 tablet (50 mg total) by mouth at bedtime., Disp: 90 tablet, Rfl: 3   DULoxetine (CYMBALTA) 20 MG capsule, 1 tablet daily x 2 weeks then 2 tablets daily (Patient not taking: Reported on 03/15/2021), Disp: 60 capsule, Rfl: 5   Review of Systems  Constitutional:  Negative for activity change, appetite  change, chills, diaphoresis, fatigue, fever and unexpected weight change.  HENT:  Negative for congestion, rhinorrhea, sinus pressure, sneezing, sore throat and trouble swallowing.   Eyes:  Negative for photophobia and visual disturbance.  Respiratory:  Negative for cough, chest tightness, shortness of breath, wheezing and stridor.   Cardiovascular:  Negative for chest pain, palpitations and leg swelling.  Gastrointestinal:  Negative for abdominal distention, abdominal pain, anal bleeding, blood in stool, constipation, diarrhea, nausea and vomiting.  Genitourinary:  Positive for vaginal discharge. Negative for  difficulty urinating, dysuria, flank pain and hematuria.  Musculoskeletal:  Negative for arthralgias, back pain, gait problem, joint swelling and myalgias.  Skin:  Negative for color change, pallor, rash and wound.  Neurological:  Positive for headaches. Negative for dizziness, tremors, weakness and light-headedness.  Hematological:  Negative for adenopathy. Does not bruise/bleed easily.  Psychiatric/Behavioral:  Positive for dysphoric mood. Negative for agitation, behavioral problems, confusion, decreased concentration, self-injury, sleep disturbance and suicidal ideas. The patient is nervous/anxious.       Objective:   Physical Exam Exam conducted with a chaperone present.  Constitutional:      General: She is not in acute distress.    Appearance: Normal appearance. She is well-developed. She is not ill-appearing or diaphoretic.  HENT:     Head: Normocephalic and atraumatic.     Right Ear: Hearing and external ear normal.     Left Ear: Hearing and external ear normal.     Nose: No nasal deformity or rhinorrhea.  Eyes:     General: No scleral icterus.    Conjunctiva/sclera: Conjunctivae normal.     Right eye: Right conjunctiva is not injected.     Left eye: Left conjunctiva is not injected.     Pupils: Pupils are equal, round, and reactive to light.  Neck:     Vascular: No JVD.  Cardiovascular:     Rate and Rhythm: Normal rate and regular rhythm.     Heart sounds: S1 normal and S2 normal.  Pulmonary:     Effort: Pulmonary effort is normal. No respiratory distress.     Breath sounds: No wheezing.  Abdominal:     General: There is no distension.     Palpations: Abdomen is soft.     Tenderness: There is no abdominal tenderness.  Genitourinary:    General: Normal vulva.     Comments: A few raised lesions in labia. Do not appear like HSV or clear cut HPV Musculoskeletal:        General: Normal range of motion.     Right shoulder: Normal.     Left shoulder: Normal.     Cervical  back: Normal range of motion and neck supple.     Right hip: Normal.     Left hip: Normal.     Right knee: Normal.     Left knee: Normal.  Lymphadenopathy:     Head:     Right side of head: No submandibular, preauricular or posterior auricular adenopathy.     Left side of head: No submandibular, preauricular or posterior auricular adenopathy.     Cervical: No cervical adenopathy.     Right cervical: No superficial or deep cervical adenopathy.    Left cervical: No superficial or deep cervical adenopathy.  Skin:    General: Skin is warm and dry.     Coloration: Skin is not pale.     Findings: No abrasion, bruising, ecchymosis, erythema, lesion or rash.     Nails: There is no clubbing.  Neurological:     General: No focal deficit present.     Mental Status: She is alert and oriented to person, place, and time.     Sensory: No sensory deficit.     Coordination: Coordination normal.     Gait: Gait normal.  Psychiatric:        Attention and Perception: She is attentive.        Mood and Affect: Mood normal.        Speech: Speech normal.        Behavior: Behavior normal. Behavior is cooperative.        Thought Content: Thought content normal.        Judgment: Judgment normal.          Assessment & Plan:  Vaginal pruritus with discharge and bumps:  I have swabbed these lesions for HSV by PCR though they do not have a typical appearance for herpes.  We are screening for gonorrhea chlamydia and trichomonas and urine as well as gonorrhea and chlamydia and oropharynx and rectum.  I am going to give her empiric fluconazole for 10 days.  She does need to get connected with [facility] for regular Pap Pap smear in years and pelvic exams.  Depressive symptoms ongoing to start Lexapro 5 mg daily and she should engage with her counselor here in clinic.  HIV disease: Ordering viral load CD4 count CBC with differential RPR comp is metabolic panel today.  She is interested in switching  over to [facility] and we will make efforts to do this, though according to Estill Bamberg and Butch Penny this will likely need to be in January in the new year.  She would like to go with every month lead dosing to try to mitigate the risk of virological failure.   Vaccines: She needs still several vaccines including Pneumovax and other COVID-19 vaccinations as well as vaccinations against hepatitis a and B  I did not pursue this counseling with her today to all the other issues that we were addressing.  She did receive a flu shot today.  I spent 42 minutes with the patient including greater than 50% of time in face to face counsel of the patient regarding the different antiretroviral regimens we can use including long-acting therapy versus daily therapy risk of virological failure that exist with long-acting therapy that did not with oral, reviewing her depressive symptoms, issues with boyfriend wanting to take meds at the same time as her, reviewing causes of vaginal itching and lesions, reviewing past viral load CD4 counts genotypes along with review of the chart before visit and preparation of the visit and in coordination of her care.

## 2021-03-16 LAB — CYTOLOGY, (ORAL, ANAL, URETHRAL) ANCILLARY ONLY
Chlamydia: NEGATIVE
Chlamydia: NEGATIVE
Comment: NEGATIVE
Comment: NORMAL
Comment: NEGATIVE
Comment: NEGATIVE
Comment: NORMAL
Neisseria Gonorrhea: NEGATIVE
Neisseria Gonorrhea: NEGATIVE
Trichomonas: NEGATIVE

## 2021-03-16 LAB — URINE CYTOLOGY ANCILLARY ONLY
Chlamydia: NEGATIVE
Comment: NEGATIVE
Comment: NORMAL
Neisseria Gonorrhea: NEGATIVE

## 2021-03-16 LAB — T-HELPER CELL (CD4) - (RCID CLINIC ONLY)
CD4 % Helper T Cell: 46 % (ref 33–65)
CD4 T Cell Abs: 1079 /uL (ref 400–1790)

## 2021-03-17 ENCOUNTER — Other Ambulatory Visit: Payer: Self-pay | Admitting: Infectious Disease

## 2021-03-17 LAB — COMPLETE METABOLIC PANEL WITH GFR
AG Ratio: 2 (calc) (ref 1.0–2.5)
ALT: 6 U/L (ref 6–29)
AST: 11 U/L (ref 10–30)
Albumin: 4.6 g/dL (ref 3.6–5.1)
Alkaline phosphatase (APISO): 40 U/L (ref 31–125)
BUN: 16 mg/dL (ref 7–25)
CO2: 21 mmol/L (ref 20–32)
Calcium: 9.5 mg/dL (ref 8.6–10.2)
Chloride: 105 mmol/L (ref 98–110)
Creat: 0.83 mg/dL (ref 0.50–0.96)
Globulin: 2.3 g/dL (calc) (ref 1.9–3.7)
Glucose, Bld: 95 mg/dL (ref 65–99)
Potassium: 4 mmol/L (ref 3.5–5.3)
Sodium: 137 mmol/L (ref 135–146)
Total Bilirubin: 0.3 mg/dL (ref 0.2–1.2)
Total Protein: 6.9 g/dL (ref 6.1–8.1)
eGFR: 103 mL/min/{1.73_m2} (ref >=60)

## 2021-03-17 LAB — RPR: RPR Ser Ql: NONREACTIVE

## 2021-03-17 LAB — LIPID PANEL
Cholesterol: 165 mg/dL (ref <200)
HDL: 55 mg/dL (ref >=50)
LDL Cholesterol (Calc): 97 mg/dL (calc)
Non-HDL Cholesterol (Calc): 110 mg/dL (calc) (ref <130)
Total CHOL/HDL Ratio: 3 (calc) (ref <5.0)
Triglycerides: 45 mg/dL (ref <150)

## 2021-03-17 LAB — HIV-1 RNA QUANT-NO REFLEX-BLD
HIV 1 RNA Quant: NOT DETECTED Copies/mL
HIV-1 RNA Quant, Log: NOT DETECTED Log cps/mL

## 2021-03-17 LAB — HERPES SIMPLEX VIRUS, TYPE 1 AND 2 DNA,QUAL,RT PCR
HSV 1 DNA: NOT DETECTED
HSV 2 DNA: DETECTED — AB

## 2021-03-17 MED ORDER — VALACYCLOVIR HCL 1 G PO TABS: 1000.0000 mg | ORAL_TABLET | Freq: Two times a day (BID) | ORAL | 3 refills | Status: AC

## 2021-03-20 ENCOUNTER — Telehealth: Payer: Self-pay

## 2021-03-20 NOTE — Telephone Encounter (Signed)
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 Relayed that Dr. Daiva Eves has sent in a medication called Valacyclovir to help treat the outbreak. Patient verbalized understanding and has no further questions.   Sandie Ano, RN

## 2021-03-20 NOTE — Telephone Encounter (Signed)
-----   Message from Randall Hiss, MD sent at 03/20/2021  8:36 AM EST ----- She was + for HSV so I sent in script for valtrex for her.  ----- Message ----- From: Janace Hoard Lab Results In Sent: 03/16/2021   6:52 AM EST To: Randall Hiss, MD

## 2021-04-09 NOTE — L&D Delivery Note (Addendum)
Delivery Note:   G2P0010 at [redacted]w[redacted]d  Admitting diagnosis: Polyhydramnios [O40.9XX0] Risks: Polyhydramnios, GBS positive adequately treated. HIV Positive on Biktarvey VL- 30 on 02/10/19. HSV on acyclovir no outbreaks at this time.   First Stage:  Induction of labor: initiated on 02/13/2022 @ 0030 Onset of labor: 02/14/2022 @ 0136 Augmentation: AROM, Pitocin, Cytotec, and IP Foley ROM: 02/14/22 @ 0138 Active labor onset: 02/14/22 @ 0138 Analgesia Donley Redder control intrapartum: Epidural   Second Stage:  Complete dilation at 02/14/2022  0625 Onset of pushing at 0945 FHR second stage Cat II. 125bpm with moderate variability. No accels, with early and variable decels with contractions, and quick return to baseline.      CNM called to patient bedside fetal head crowned up to +4 station with pushing. Patient Pushing in lithotomy position with CNM and L&D staff support at bedside. FOB and patients grandmother present for birth and supportive.   Delivery of a Live born female  Birth Weight: PENDING   APGAR: 9, 9  Newborn Delivery   Birth date/time: 02/14/2022 10:04:07 Delivery type: Vaginal, Spontaneous     Fetal head delivered in cephalic presentation, position DOA and restituted to LOT. With great maternal pushing efforts remaining fetal body delivered with ease. Vigorously crying infant placed immediately skin to skin with mom.  Nuchal Cord: No , but true knot identified    After 2 mins of life cord double clamped and cut by FOB.  Collection of cord blood for typing completed. Cord blood donation- n/a  Arterial cord blood sample-  n/a   Third Stage:  With gentle cord traction and LUS massage Placenta delivered- spontaneously intact  with 3 vessels. Uterine tone boggy and bleeding brisk with clots initially. Uterine tone firmed with massage and bleeding slowed to minimal. Post placental Mirena IUD placed with minimal difficulty. Bleeding increased to brisk and TXA initiated.  Uterus firmed with massage and bleeding decreased.    Uterotonics: IV Pit bolus and TXA  Placenta to L7D for disposal .  Bilateral labial tears identified.    Episiotomy:None  Local analgesia: N/A   Repair:1st degree hemostatic on the right labia.Repaired with 1 interrupted stitch remained hemostatic. 2nd degree left labial tear identified and repaired to hemostasis in traditional fashion using a 4.0 Vicryl. Est. Blood Loss (mL):532.00   Complications: None   Mom to postpartum.  Baby boy "Asce" to Couplet care / Skin to Skin.  Delivery Report:  Review the Delivery Report for details.     Post-Placental IUD Insertion Procedure Note  Patient identified, informed consent signed prior to delivery, signed copy in chart, time out was performed.    Vaginal, labial and perineal areas thoroughly inspected for lacerations. 2nd degree laceration identified on left labia and 1st degree noted on the right.   Right labial tear hemostatic hemostatic. Left hemostatics but both repaired prior to insertion of Mirena IUD  - IUD grasped between sterile gloved fingers. Sterile lubrication applied to sterile gloved hand for ease of insertion. Fundus identified through abdominal wall using non-insertion hand. IUD inserted to fundus with bimanual technique. IUD carefully released at the fundus and insertion hand gently removed from vagina.   - IUD inserted per manufacturer's instructions.    Strings trimmed to the level of the introitus. Patient tolerated procedure well.  Lot # [id number] Expiration Date 01/08/2024  Patient given post procedure instructions and IUD care card with expiration date.  Patient is asked to keep IUD strings tucked in her vagina until her postpartum follow up  visit in 4-6 weeks. Patient advised to abstain from sexual intercourse and pulling on strings before her follow-up visit. Patient verbalized an understanding of the plan of care and agrees.   Alyssa Pint Danella Deis) Suzie Portela,  MSN, CNM  [facility]  02/14/22 10:39 AM

## 2021-05-01 ENCOUNTER — Telehealth: Payer: Self-pay

## 2021-05-01 NOTE — Telephone Encounter (Signed)
Patient called complaining of a "knot on her vagina" x 2 days. She reports pain with areas as well.  Patient denies any discharge from the area. Patient has not used anything OTC to help with the pain. Patient advised to also try warm compresses as well. Patient is scheduled with Dr. Thedore Mins tomorrow.

## 2021-05-02 ENCOUNTER — Ambulatory Visit (INDEPENDENT_AMBULATORY_CARE_PROVIDER_SITE_OTHER): Payer: Self-pay | Admitting: Internal Medicine

## 2021-05-02 ENCOUNTER — Other Ambulatory Visit: Payer: Self-pay

## 2021-05-02 ENCOUNTER — Encounter: Payer: Self-pay | Admitting: Internal Medicine

## 2021-05-02 VITALS — BP 106/72 | HR 95 | Temp 98.0°F | Ht <= 58 in | Wt 126.0 lb

## 2021-05-02 DIAGNOSIS — A64 Unspecified sexually transmitted disease: Secondary | ICD-10-CM

## 2021-05-02 NOTE — Progress Notes (Signed)
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   ESCITALOPRAM (LEXAPRO) 5 MG TABLET    Take 1 tablet (5 mg total) by mouth daily.   FLUCONAZOLE (DIFLUCAN) 100 MG TABLET    Take 1 tablet (100 mg total) by mouth daily.   QUETIAPINE (SEROQUEL) 50 MG TABLET    Take 1 tablet (50 mg total) by mouth at bedtime.  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 She reports a painful knot in her labia that she noticed about 2 days ago. She has no open lesions. Last time she was sexually active was about 2 weeks ago with her partner. LMP was one weeks ago. She denies having  knot similar to this episode before.   Review of Systems: Review of Systems  All other systems reviewed and are negative.  Past Medical History:  Diagnosis Date   Chronic constipation 06/10/2012   COVID-19 04/2020   Dysfunctional uterine bleeding    GERD (gastroesophageal reflux disease) 06/10/2012   Headache 12/09/2020   Low back pain 12/09/2020   Passive suicidal ideations 11/07/2020   Vaginal pruritus 03/15/2021   Vaginal yeast infection     Social History   Tobacco Use   Smoking status: Every Day    Types: Cigars   Smokeless tobacco: Never  Substance Use Topics   Alcohol use: No   Drug use:  Yes    Types: Marijuana    Family History  Problem Relation Age of Onset   Hypertension Maternal Grandmother    Colon cancer Maternal Grandfather    Fibromyalgia Mother     No Known Allergies  Health Maintenance  Topic Date Due   HPV VACCINES (1 - Risk 3-dose series) Never done   TETANUS/TDAP  Never done   COVID-19 Vaccine (2 - Pfizer risk series) 01/08/2020   CHLAMYDIA SCREENING  03/15/2022   PAP-Cervical Cytology Screening  10/20/2023   PAP SMEAR-Modifier  10/20/2023   INFLUENZA VACCINE  Completed   Hepatitis C Screening  Completed   HIV Screening  Completed    Objective:  Vitals:   05/02/21 0826  Weight: 126 lb (57.2 kg)  Height: 4\' 10"  (1.473 m)   Body mass index is 26.33 kg/m.  Physical Exam Constitutional:      Appearance: Normal appearance.  HENT:     Head: Normocephalic and atraumatic.     Right Ear: Tympanic membrane normal.     Left Ear: Tympanic membrane normal.     Nose: Nose normal.     Mouth/Throat:     Mouth: Mucous membranes are moist.  Eyes:     Extraocular Movements: Extraocular movements intact.     Conjunctiva/sclera: Conjunctivae normal.     Pupils: Pupils are equal,  round, and reactive to light.  Cardiovascular:     Rate and Rhythm: Normal rate and regular rhythm.     Heart sounds: No murmur heard.   No friction rub. No gallop.  Pulmonary:     Effort: Pulmonary effort is normal.     Breath sounds: Normal breath sounds.  Abdominal:     General: Abdomen is flat.     Palpations: Abdomen is soft.  Genitourinary:    Comments: Right labia noted to have tender 2cm nodule on deep palpation.  Musculoskeletal:        General: Normal range of motion.  Skin:    General: Skin is warm and dry.  Neurological:     General: No focal deficit present.     Mental Status: She is alert and oriented to person, place, and time.  Psychiatric:        Mood and Affect: Mood normal.    Lab Results Lab Results  Component Value Date   WBC 6.0  12/09/2020   HGB 12.7 12/09/2020   HCT 37.9 12/09/2020   MCV 88.3 12/09/2020   PLT 349 12/09/2020    Lab Results  Component Value Date   CREATININE 0.83 03/15/2021   BUN 16 03/15/2021   NA 137 03/15/2021   K 4.0 03/15/2021   CL 105 03/15/2021   CO2 21 03/15/2021    Lab Results  Component Value Date   ALT 6 03/15/2021   AST 11 03/15/2021   ALKPHOS 97 06/10/2012   BILITOT 0.3 03/15/2021    Lab Results  Component Value Date   CHOL 165 03/15/2021   HDL 55 03/15/2021   LDLCALC 97 03/15/2021   TRIG 45 03/15/2021   CHOLHDL 3.0 03/15/2021   Lab Results  Component Value Date   LABRPR NON-REACTIVE 03/15/2021   HIV 1 RNA Quant  Date Value  03/15/2021 Not Detected Copies/mL  12/09/2020 <20 Copies/mL (H)  11/07/2020 3,460 copies/mL (H)   CD4 T Cell Abs (/uL)  Date Value  03/15/2021 1,079  11/07/2020 566   #Tender right labial nodule -Labia is unremarkable on gross exam although tender 2 cm nodule palpated. I suspect this is likely a cyst. Will refer to gyn urgently, in case drainage is needed as it is painful.  -As there are no obvious lesion/open sores clinical picture is not consistent with HSV.  No dysuria.  Recommendations: -Urgent referral to OB-GYN -Ordered rpr, gc urine, cbc, cmp -Follow-up with ID in 2 weeks  Danelle Earthly, MD [facility] 05/02/2021, 8:27 AM

## 2021-05-03 LAB — CBC WITH DIFFERENTIAL/PLATELET
Absolute Monocytes: 458 cells/uL (ref 200–950)
Basophils Absolute: 24 cells/uL (ref 0–200)
Basophils Relative: 0.3 %
Eosinophils Absolute: 40 cells/uL (ref 15–500)
Eosinophils Relative: 0.5 %
HCT: 36.8 % (ref 35.0–45.0)
Hemoglobin: 12.3 g/dL (ref 11.7–15.5)
Lymphs Abs: 1912 cells/uL (ref 850–3900)
MCH: 30.1 pg (ref 27.0–33.0)
MCHC: 33.4 g/dL (ref 32.0–36.0)
MCV: 90 fL (ref 80.0–100.0)
MPV: 9.3 fL (ref 7.5–12.5)
Monocytes Relative: 5.8 %
Neutro Abs: 5467 cells/uL (ref 1500–7800)
Neutrophils Relative %: 69.2 %
Platelets: 252 10*3/uL (ref 140–400)
RBC: 4.09 10*6/uL (ref 3.80–5.10)
RDW: 13.4 % (ref 11.0–15.0)
Total Lymphocyte: 24.2 %
WBC: 7.9 10*3/uL (ref 3.8–10.8)

## 2021-05-03 LAB — COMPLETE METABOLIC PANEL WITH GFR
AG Ratio: 2 (calc) (ref 1.0–2.5)
ALT: 6 U/L (ref 6–29)
AST: 11 U/L (ref 10–30)
Albumin: 4.3 g/dL (ref 3.6–5.1)
Alkaline phosphatase (APISO): 50 U/L (ref 31–125)
BUN: 13 mg/dL (ref 7–25)
CO2: 29 mmol/L (ref 20–32)
Calcium: 9.1 mg/dL (ref 8.6–10.2)
Chloride: 104 mmol/L (ref 98–110)
Creat: 0.6 mg/dL (ref 0.50–0.96)
Globulin: 2.2 g/dL (calc) (ref 1.9–3.7)
Glucose, Bld: 101 mg/dL — ABNORMAL HIGH (ref 65–99)
Potassium: 3.7 mmol/L (ref 3.5–5.3)
Sodium: 139 mmol/L (ref 135–146)
Total Bilirubin: 0.4 mg/dL (ref 0.2–1.2)
Total Protein: 6.5 g/dL (ref 6.1–8.1)
eGFR: 131 mL/min/{1.73_m2} (ref >=60)

## 2021-05-03 LAB — URINE CYTOLOGY ANCILLARY ONLY
Chlamydia: NEGATIVE
Comment: NEGATIVE
Comment: NORMAL
Neisseria Gonorrhea: NEGATIVE

## 2021-05-03 LAB — RPR: RPR Ser Ql: NONREACTIVE

## 2021-05-09 ENCOUNTER — Encounter: Payer: Self-pay | Admitting: Adult Health

## 2021-05-16 ENCOUNTER — Ambulatory Visit: Payer: Self-pay | Admitting: Internal Medicine

## 2021-07-03 ENCOUNTER — Ambulatory Visit: Payer: Self-pay | Admitting: Family

## 2021-07-03 ENCOUNTER — Telehealth: Payer: Self-pay

## 2021-07-03 NOTE — Telephone Encounter (Signed)
Patient called office today stating she had a positive pregnancy test on 3/24. Requested appointment for today. Scheduled to see Carver Fila, FNP. Will forward message to pharmacy team and provider. ?Juanita Laster, RMA ? ?

## 2021-07-04 ENCOUNTER — Encounter (HOSPITAL_COMMUNITY): Payer: Self-pay | Admitting: *Deleted

## 2021-07-04 ENCOUNTER — Other Ambulatory Visit: Payer: Self-pay

## 2021-07-04 ENCOUNTER — Emergency Department (HOSPITAL_COMMUNITY)
Admission: EM | Admit: 2021-07-04 | Discharge: 2021-07-04 | Disposition: A | Discharge status: Home / Self Care | Payer: Medicaid Other | Attending: Student | Admitting: Student

## 2021-07-04 DIAGNOSIS — R42 Dizziness and giddiness: Secondary | ICD-10-CM | POA: Insufficient documentation

## 2021-07-04 DIAGNOSIS — F17210 Nicotine dependence, cigarettes, uncomplicated: Secondary | ICD-10-CM | POA: Insufficient documentation

## 2021-07-04 DIAGNOSIS — R109 Unspecified abdominal pain: Secondary | ICD-10-CM | POA: Diagnosis not present

## 2021-07-04 DIAGNOSIS — N9489 Other specified conditions associated with female genital organs and menstrual cycle: Secondary | ICD-10-CM | POA: Diagnosis not present

## 2021-07-04 DIAGNOSIS — Z3201 Encounter for pregnancy test, result positive: Secondary | ICD-10-CM | POA: Insufficient documentation

## 2021-07-04 DIAGNOSIS — Z8616 Personal history of COVID-19: Secondary | ICD-10-CM | POA: Diagnosis not present

## 2021-07-04 DIAGNOSIS — Z21 Asymptomatic human immunodeficiency virus [HIV] infection status: Secondary | ICD-10-CM | POA: Diagnosis not present

## 2021-07-04 DIAGNOSIS — Z3490 Encounter for supervision of normal pregnancy, unspecified, unspecified trimester: Secondary | ICD-10-CM

## 2021-07-04 LAB — COMPREHENSIVE METABOLIC PANEL
ALT: 11 U/L (ref 0–44)
AST: 15 U/L (ref 15–41)
Albumin: 4.1 g/dL (ref 3.5–5.0)
Alkaline Phosphatase: 44 U/L (ref 38–126)
Anion gap: 9 (ref 5–15)
BUN: 12 mg/dL (ref 6–20)
CO2: 21 mmol/L — ABNORMAL LOW (ref 22–32)
Calcium: 8.8 mg/dL — ABNORMAL LOW (ref 8.9–10.3)
Chloride: 105 mmol/L (ref 98–111)
Creatinine, Ser: 0.34 mg/dL — ABNORMAL LOW (ref 0.44–1.00)
GFR, Estimated: >60 mL/min (ref >60)
Glucose, Bld: 95 mg/dL (ref 70–99)
Potassium: 3.6 mmol/L (ref 3.5–5.1)
Sodium: 135 mmol/L (ref 135–145)
Total Bilirubin: 0.3 mg/dL (ref 0.3–1.2)
Total Protein: 7.1 g/dL (ref 6.5–8.1)

## 2021-07-04 LAB — CBC WITH DIFFERENTIAL/PLATELET
Abs Immature Granulocytes: 0.02 10*3/uL (ref 0.00–0.07)
Basophils Absolute: 0 10*3/uL (ref 0.0–0.1)
Basophils Relative: 0 %
Eosinophils Absolute: 0.1 10*3/uL (ref 0.0–0.5)
Eosinophils Relative: 1 %
HCT: 36.7 % (ref 36.0–46.0)
Hemoglobin: 12.1 g/dL (ref 12.0–15.0)
Immature Granulocytes: 0 %
Lymphocytes Relative: 24 %
Lymphs Abs: 2.5 10*3/uL (ref 0.7–4.0)
MCH: 29.2 pg (ref 26.0–34.0)
MCHC: 33 g/dL (ref 30.0–36.0)
MCV: 88.4 fL (ref 80.0–100.0)
Monocytes Absolute: 0.8 10*3/uL (ref 0.1–1.0)
Monocytes Relative: 7 %
Neutro Abs: 7.1 10*3/uL (ref 1.7–7.7)
Neutrophils Relative %: 68 %
Platelets: 296 10*3/uL (ref 150–400)
RBC: 4.15 MIL/uL (ref 3.87–5.11)
RDW: 13.6 % (ref 11.5–15.5)
WBC: 10.5 10*3/uL (ref 4.0–10.5)
nRBC: 0 % (ref 0.0–0.2)

## 2021-07-04 LAB — HCG, QUANTITATIVE, PREGNANCY: hCG, Beta Chain, Quant, S: 111496 m[IU]/mL — ABNORMAL HIGH (ref <5)

## 2021-07-04 NOTE — ED Triage Notes (Signed)
Pt with c/o lightheadedness for a week.  C/o abd pain.  LMP was a month ago. 3/24 positive home pregnancy test.  Denies any vaginal bleeding.  ?

## 2021-07-04 NOTE — ED Provider Notes (Signed)
?[facility] ?Provider Note ? ?CSN: [id number] ?Arrival date & time: 07/04/21 1828 ? ?Chief Complaint(s) ?Dizziness ? ?HPI ?Saray Capasso is a 21 y.o. female with PMH HIV on antiretroviral therapy therapy who presents emergency department for evaluation of dizziness and abdominal cramping.  Patient states that her symptoms have been present over the last few days.  She states she took a home pregnancy test and it was positive.  Denies any vaginal bleeding, nausea, vomiting, diarrhea or other systemic symptoms.  She does endorse mild abdominal cramping but this has resolved on my evaluation.  States her last menstrual period was approximately 4 weeks ago. ? ? ?Dizziness ? ?Past Medical History ?Past Medical History:  ?Diagnosis Date  ? Chronic constipation 06/10/2012  ? COVID-19 04/2020  ? Dysfunctional uterine bleeding   ? GERD (gastroesophageal reflux disease) 06/10/2012  ? Headache 12/09/2020  ? Low back pain 12/09/2020  ? Passive suicidal ideations 11/07/2020  ? Vaginal pruritus 03/15/2021  ? Vaginal yeast infection   ? ?Patient Active Problem List  ? Diagnosis Date Noted  ? Vaginal pruritus 03/15/2021  ? Headache 12/09/2020  ? Low back pain 12/09/2020  ? 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  ? Passive suicidal ideations 11/07/2020  ? HIV disease (HCC) 11/01/2020  ? RLQ abdominal pain 10/21/2020  ? Irregular menses 10/21/2020  ? Screen for STD (sexually transmitted disease) 08/12/2018  ? ?Home Medication(s) ?Prior to Admission medications   ?Medication Sig Start Date End Date Taking? Authorizing Provider  ?bictegravir-emtricitabine-tenofovir AF (BIKTARVY) 50-200-25 MG TABS tablet Take 1 tablet by mouth daily. 12/23/20   Randall Hiss, MD  ?escitalopram (LEXAPRO) 5 MG tablet Take 1 tablet (5 mg total) by mouth daily. 03/15/21   Randall Hiss, MD  ?fluconazole (DIFLUCAN) 100 MG tablet Take 1 tablet (100 mg total) by mouth daily. ?Patient not taking: Reported on 05/02/2021 03/15/21   Daiva Eves, Lisette Grinder, MD  ?QUEtiapine (SEROQUEL) 50 MG tablet Take 1 tablet (50 mg total) by mouth at bedtime. 12/09/20   Randall Hiss, MD  ?norelgestromin-ethinyl estradiol (ORTHO EVRA) 150-35 MCG/24HR transdermal patch Place 1 patch onto the skin once a week. 10/22/18 11/09/19  Felix Pacini A, DO  ?                                                                                                                                  ?Past Surgical History ?Past Surgical History:  ?Procedure Laterality Date  ? NO PAST SURGERIES    ? ?Family History ?Family History  ?Problem Relation Age of Onset  ? Hypertension Maternal Grandmother   ? Colon cancer Maternal Grandfather   ? Fibromyalgia Mother   ? ? ?Social History ?Social History  ? ?Tobacco Use  ? Smoking status: Every Day  ?  Types: Cigars, E-cigarettes  ? Smokeless tobacco: Never  ? Tobacco comments:  ?  vapes  ?Substance Use Topics  ? Alcohol use:  No  ? Drug use: Not Currently  ?  Types: Marijuana  ? ?Allergies ?Patient has no known allergies. ? ?Review of Systems ?Review of Systems  ?Neurological:  Positive for dizziness.  ? ?Physical Exam ?Vital Signs  ?I have reviewed the triage vital signs ?BP 108/70 (BP Location: Right Arm)   Pulse 81   Temp 98.3 ?F (36.8 ?C) (Oral)   Resp 18   Ht 4\' 10"  (1.473 m)   Wt 63.5 kg   LMP  (LMP Unknown) Comment: a month ago.  + home pregnancy test on 3/24  SpO2 100%   BMI 29.26 kg/m?  ? ?Physical Exam ?Vitals and nursing note reviewed.  ?Constitutional:   ?   General: She is not in acute distress. ?   Appearance: She is well-developed.  ?HENT:  ?   Head: Normocephalic and atraumatic.  ?Eyes:  ?   Conjunctiva/sclera: Conjunctivae normal.  ?Cardiovascular:  ?   Rate and Rhythm: Normal rate and regular rhythm.  ?   Heart sounds: No murmur heard. ?Pulmonary:  ?   Effort: Pulmonary effort is normal. No respiratory distress.  ?   Breath sounds: Normal breath sounds.  ?Abdominal:  ?   Palpations: Abdomen is soft.  ?   Tenderness: There is no  abdominal tenderness.  ?Musculoskeletal:     ?   General: No swelling.  ?   Cervical back: Neck supple.  ?Skin: ?   General: Skin is warm and dry.  ?   Capillary Refill: Capillary refill takes less than 2 seconds.  ?Neurological:  ?   Mental Status: She is alert.  ?Psychiatric:     ?   Mood and Affect: Mood normal.  ? ? ?ED Results and Treatments ?Labs ?(all labs ordered are listed, but only abnormal results are displayed) ?Labs Reviewed - No data to display                                                                                                                       ? ?Radiology ?No results found. ? ?Pertinent labs & imaging results that were available during my care of the patient were reviewed by me and considered in my medical decision making (see MDM for details). ? ?Medications Ordered in ED ?Medications - No data to display                                                               ?                                                                    ?  Procedures ?Procedures ? ?(including critical care time) ? ?Medical Decision Making / ED Course ? ? ?This patient presents to the ED for concern of dizziness, positive pregnancy test, this involves an extensive number of treatment options, and is a complaint that carries with it a high risk of complications and morbidity.  The differential diagnosis includes pregnancy, hypovolemia, orthostatic dizziness, ectopic pregnancy ? ?MDM: ?Seen the emergency department for evaluation of dizziness and abdominal cramping.  Physical exam is unremarkable with no tenderness to palpation over the abdomen.  Laboratory evaluation largely unremarkable.  Beta quant [id number].  I attempted to perform a bedside ultrasound but was unable to visualize the uterus.  As the patient is having no vaginal bleeding or persistent abdominal pain, we shared decision making to come up with a plan that the patient will follow-up outpatient with OB/GYN and we will not perform transvaginal  ultrasound today.  This is not unreasonable and I have very low suspicion for ectopic pregnancy at this time.  Patient able to tolerate p.o. without difficulty and did not have persistent dizziness on my reevaluation.  Patient safe for discharge with outpatient OB/GYN follow-up. ? ? ?Additional history obtained: ? ?-External records from outside source obtained and reviewed including: Chart review including previous notes, labs, imaging, consultation notes ? ? ?Lab Tests: ?-I ordered, reviewed, and interpreted labs.   ?The pertinent results include:   ?Labs Reviewed - No data to display  ? ? ? ? ?Medicines ordered and prescription drug management: ?No orders of the defined types were placed in this encounter. ?  ?-I have reviewed the patients home medicines and have made adjustments as needed ? ?Critical interventions ?none ? ? ?Cardiac Monitoring: ?The patient was maintained on a cardiac monitor.  I personally viewed and interpreted the cardiac monitored which showed an underlying rhythm of: NSr ? ?Social Determinants of Health:  ?Factors impacting patients care include: First pregnancy ? ? ?Reevaluation: ?After the interventions noted above, I reevaluated the patient and found that they have :improved ? ?Co morbidities that complicate the patient evaluation ? ?Past Medical History:  ?Diagnosis Date  ? Chronic constipation 06/10/2012  ? COVID-19 04/2020  ? Dysfunctional uterine bleeding   ? GERD (gastroesophageal reflux disease) 06/10/2012  ? Headache 12/09/2020  ? Low back pain 12/09/2020  ? Passive suicidal ideations 11/07/2020  ? Vaginal pruritus 03/15/2021  ? Vaginal yeast infection   ?  ? ? ?Dispostion: ?I considered admission for this patient, but as she is able to tolerate p.o. without difficulty and has no vaginal bleeding or persistent abdominal pain she is safe for discharge with OB/GYN follow-up. ? ? ? ? ?Final Clinical Impression(s) / ED Diagnoses ?Final diagnoses:  ?None  ? ? ? ?@PCDICTATION @ ? ?  ?Glendora ScoreKommor,  Lawrance Wiedemann, MD ?07/04/21 2344 ? ?

## 2021-07-07 ENCOUNTER — Ambulatory Visit: Payer: Self-pay | Admitting: Family

## 2021-07-07 ENCOUNTER — Telehealth: Payer: Self-pay

## 2021-07-07 NOTE — Telephone Encounter (Signed)
Called patient to see if she would be able to come in for her appointment with Tammy Sours this morning. Patient stated she would need to reschedule. ? ?Rescheduled patient with Dr. Daiva Eves on 07/13/21, per her preference. ? ?Per Tammy Sours, called patient again to confirm that she was taking a prenatal vitamin. Patient stated she was taking an OTC prenatal at this time. Also reinforced to patient that it was important for her to make her appointment with Dr. Daiva Eves in order to discuss any medication changes needed during pregnancy. Patient stated understanding. Encouraged patient to call or send MyChart message if she had additional questions. ? ?Alyssa Lenz, RN  ?

## 2021-07-13 ENCOUNTER — Encounter: Payer: Self-pay | Admitting: Infectious Disease

## 2021-07-13 ENCOUNTER — Other Ambulatory Visit: Payer: Self-pay

## 2021-07-13 ENCOUNTER — Ambulatory Visit (INDEPENDENT_AMBULATORY_CARE_PROVIDER_SITE_OTHER): Payer: Medicaid Other | Admitting: Infectious Disease

## 2021-07-13 VITALS — BP 112/77 | HR 79 | Temp 98.0°F | Wt 134.0 lb

## 2021-07-13 DIAGNOSIS — O0990 Supervision of high risk pregnancy, unspecified, unspecified trimester: Secondary | ICD-10-CM

## 2021-07-13 DIAGNOSIS — B20 Human immunodeficiency virus [HIV] disease: Secondary | ICD-10-CM | POA: Diagnosis not present

## 2021-07-13 DIAGNOSIS — O0991 Supervision of high risk pregnancy, unspecified, first trimester: Secondary | ICD-10-CM

## 2021-07-13 HISTORY — DX: Supervision of high risk pregnancy, unspecified, unspecified trimester: O09.90

## 2021-07-13 MED ORDER — BIKTARVY 50-200-25 MG PO TABS: 1.0000 | ORAL_TABLET | Freq: Every day | ORAL | 11 refills | Status: DC

## 2021-07-13 NOTE — Progress Notes (Signed)
?Chief complaint follow-up for HIV disease now pregnant in first trimester ? ?Subjective:  ? ? Patient ID: Alyssa Sandoval, female    DOB: 07/10/2000, 21 y.o.   MRN: [id number] ? ?HPI ? ?Alyssa Sandoval is a 21 year old Black woman living with HIV relatively recently diagnosed but perfectly suppressed on Biktarvy who is now pregnant. ? ?Comes to clinic accompanied by her boyfriend who is also my patient. ? ?He is also well controlled currently on BIKTARVY. ? ?She is scheduled for further follow-up with OB/GYN. ? ?Had some nausea and inquired about medications for this which ones were safe and I suggested Zofran that she did not want this prescribed yet. ? ?Past Medical History:  ?Diagnosis Date  ? Chronic constipation 06/10/2012  ? COVID-19 04/2020  ? Dysfunctional uterine bleeding   ? GERD (gastroesophageal reflux disease) 06/10/2012  ? Headache 12/09/2020  ? High-risk pregnancy 07/13/2021  ? Low back pain 12/09/2020  ? Passive suicidal ideations 11/07/2020  ? Vaginal pruritus 03/15/2021  ? Vaginal yeast infection   ? ? ?Past Surgical History:  ?Procedure Laterality Date  ? NO PAST SURGERIES    ? ? ?Family History  ?Problem Relation Age of Onset  ? Hypertension Maternal Grandmother   ? Colon cancer Maternal Grandfather   ? Fibromyalgia Mother   ? ? ?  ?Social History  ? ?Socioeconomic History  ? Marital status: Single  ?  Spouse name: Not on file  ? Number of children: Not on file  ? Years of education: Not on file  ? Highest education level: Not on file  ?Occupational History  ? Not on file  ?Tobacco Use  ? Smoking status: Every Day  ?  Types: Cigars, E-cigarettes  ? Smokeless tobacco: Never  ? Tobacco comments:  ?  vapes  ?Vaping Use  ? Vaping Use: Not on file  ?Substance and Sexual Activity  ? Alcohol use: No  ? Drug use: Not Currently  ?  Types: Marijuana  ? Sexual activity: Not Currently  ?  Partners: Male  ?  Birth control/protection: Injection  ?  Comment: declined condoms  ?Other Topics Concern  ? Not on file  ?Social History  Narrative  ? Marital status/children/pets: Single.   ? Education/employment: Cosmetology student  ?   ? ?Social Determinants of Health  ? ?Financial Resource Strain: Not on file  ?Food Insecurity: Not on file  ?Transportation Needs: Not on file  ?Physical Activity: Not on file  ?Stress: Not on file  ?Social Connections: Not on file  ? ? ?No Known Allergies ? ? ?Current Outpatient Medications:  ?  Prenatal Vit-Fe Fumarate-FA (PRENATAL MULTIVITAMIN) TABS tablet, Take 1 tablet by mouth daily at 12 noon., Disp: , Rfl:  ?  bictegravir-emtricitabine-tenofovir AF (BIKTARVY) 50-200-25 MG TABS tablet, Take 1 tablet by mouth daily., Disp: 30 tablet, Rfl: 11 ?  escitalopram (LEXAPRO) 5 MG tablet, Take 1 tablet (5 mg total) by mouth daily. (Patient not taking: Reported on 07/04/2021), Disp: 30 tablet, Rfl: 4 ?  fluconazole (DIFLUCAN) 100 MG tablet, Take 1 tablet (100 mg total) by mouth daily. (Patient not taking: Reported on 05/02/2021), Disp: 10 tablet, Rfl: 0 ?  QUEtiapine (SEROQUEL) 50 MG tablet, Take 1 tablet (50 mg total) by mouth at bedtime. (Patient not taking: Reported on 07/04/2021), Disp: 90 tablet, Rfl: 3 ? ? ?Review of Systems  ?Constitutional:  Negative for activity change, appetite change, chills, diaphoresis, fatigue, fever and unexpected weight change.  ?HENT:  Negative for congestion, rhinorrhea, sinus pressure, sneezing, sore  throat and trouble swallowing.   ?Eyes:  Negative for photophobia and visual disturbance.  ?Respiratory:  Negative for cough, chest tightness, shortness of breath, wheezing and stridor.   ?Cardiovascular:  Negative for chest pain, palpitations and leg swelling.  ?Gastrointestinal:  Positive for nausea. Negative for abdominal distention, abdominal pain, anal bleeding, blood in stool, constipation, diarrhea and vomiting.  ?Genitourinary:  Negative for difficulty urinating, dysuria, flank pain and hematuria.  ?Musculoskeletal:  Negative for arthralgias, back pain, gait problem, joint swelling  and myalgias.  ?Skin:  Negative for color change, pallor, rash and wound.  ?Neurological:  Negative for dizziness, tremors, weakness and light-headedness.  ?Hematological:  Negative for adenopathy. Does not bruise/bleed easily.  ?Psychiatric/Behavioral:  Negative for agitation, behavioral problems, confusion, decreased concentration, dysphoric mood and sleep disturbance.   ? ?   ?Objective:  ? Physical Exam ?Constitutional:   ?   General: She is not in acute distress. ?   Appearance: Normal appearance. She is well-developed. She is not ill-appearing or diaphoretic.  ?HENT:  ?   Head: Normocephalic and atraumatic.  ?   Right Ear: Hearing and external ear normal.  ?   Left Ear: Hearing and external ear normal.  ?   Nose: No nasal deformity or rhinorrhea.  ?Eyes:  ?   General: No scleral icterus. ?   Conjunctiva/sclera: Conjunctivae normal.  ?   Right eye: Right conjunctiva is not injected.  ?   Left eye: Left conjunctiva is not injected.  ?   Pupils: Pupils are equal, round, and reactive to light.  ?Neck:  ?   Vascular: No JVD.  ?Cardiovascular:  ?   Rate and Rhythm: Normal rate and regular rhythm.  ?   Heart sounds: Normal heart sounds, S1 normal and S2 normal. No murmur heard. ?  No friction rub.  ?Abdominal:  ?   General: Bowel sounds are normal. There is no distension.  ?   Palpations: Abdomen is soft.  ?   Tenderness: There is no abdominal tenderness.  ?Musculoskeletal:     ?   General: Normal range of motion.  ?   Right shoulder: Normal.  ?   Left shoulder: Normal.  ?   Cervical back: Normal range of motion and neck supple.  ?   Right hip: Normal.  ?   Left hip: Normal.  ?   Right knee: Normal.  ?   Left knee: Normal.  ?Lymphadenopathy:  ?   Head:  ?   Right side of head: No submandibular, preauricular or posterior auricular adenopathy.  ?   Left side of head: No submandibular, preauricular or posterior auricular adenopathy.  ?   Cervical: No cervical adenopathy.  ?   Right cervical: No superficial or deep  cervical adenopathy. ?   Left cervical: No superficial or deep cervical adenopathy.  ?Skin: ?   General: Skin is warm and dry.  ?   Coloration: Skin is not pale.  ?   Findings: No abrasion, bruising, ecchymosis, erythema, lesion or rash.  ?   Nails: There is no clubbing.  ?Neurological:  ?   Mental Status: She is alert and oriented to person, place, and time.  ?   Sensory: No sensory deficit.  ?   Coordination: Coordination normal.  ?   Gait: Gait normal.  ?Psychiatric:     ?   Attention and Perception: She is attentive.     ?   Speech: Speech normal.     ?   Behavior: Behavior normal.  Behavior is cooperative.     ?   Thought Content: Thought content normal.     ?   Judgment: Judgment normal.  ? ? ? ? ? ?   ?Assessment & Plan:  ? ?HIV disease in high risk pregnancy ? ?The DHHS recommended guidelines are for Tivicay and Truvada vs Tivicay and Descovy ? ?[name] is not on their recommended list but ONLY because lack of PK data in pregnancy. ? ?Bictegravir and Dolutegravir are SO structurally similar as drugs that manufacturer of the latter I understood has even sued the former. So I would predict that IF the drug was studied the PK data would be nearly identical to that of DTG ? ?I offered switch vs staying on current regimen and place her into pregnancy registry ? ?Extensive discussion she preferred to stay on BIKTARVY.  We will recheck her viral load CD4 CMP RPR GC and chlamydia today today and see her every 2 months for follow-up and recheck her viral loads diligently. ? ?Nausea I would recommend Zofran during pregnancy ? ?

## 2021-07-14 LAB — T-HELPER CELLS (CD4) COUNT (NOT AT ARMC)
CD4 % Helper T Cell: 37 % (ref 33–65)
CD4 T Cell Abs: 784 /uL (ref 400–1790)

## 2021-07-15 LAB — CBC WITH DIFFERENTIAL/PLATELET
Absolute Monocytes: 719 cells/uL (ref 200–950)
Basophils Absolute: 18 cells/uL (ref 0–200)
Basophils Relative: 0.2 %
Eosinophils Absolute: 73 cells/uL (ref 15–500)
Eosinophils Relative: 0.8 %
HCT: 38 % (ref 35.0–45.0)
Hemoglobin: 12.3 g/dL (ref 11.7–15.5)
Lymphs Abs: 2402 cells/uL (ref 850–3900)
MCH: 28.3 pg (ref 27.0–33.0)
MCHC: 32.4 g/dL (ref 32.0–36.0)
MCV: 87.4 fL (ref 80.0–100.0)
MPV: 8.9 fL (ref 7.5–12.5)
Monocytes Relative: 7.9 %
Neutro Abs: 5888 cells/uL (ref 1500–7800)
Neutrophils Relative %: 64.7 %
Platelets: 334 10*3/uL (ref 140–400)
RBC: 4.35 10*6/uL (ref 3.80–5.10)
RDW: 13.1 % (ref 11.0–15.0)
Total Lymphocyte: 26.4 %
WBC: 9.1 10*3/uL (ref 3.8–10.8)

## 2021-07-15 LAB — COMPLETE METABOLIC PANEL WITH GFR
AG Ratio: 1.7 (calc) (ref 1.0–2.5)
ALT: 6 U/L (ref 6–29)
AST: 12 U/L (ref 10–30)
Albumin: 4.2 g/dL (ref 3.6–5.1)
Alkaline phosphatase (APISO): 43 U/L (ref 31–125)
BUN/Creatinine Ratio: 17 (calc) (ref 6–22)
BUN: 7 mg/dL (ref 7–25)
CO2: 23 mmol/L (ref 20–32)
Calcium: 9.3 mg/dL (ref 8.6–10.2)
Chloride: 104 mmol/L (ref 98–110)
Creat: 0.41 mg/dL — ABNORMAL LOW (ref 0.50–0.96)
Globulin: 2.5 g/dL (calc) (ref 1.9–3.7)
Glucose, Bld: 82 mg/dL (ref 65–99)
Potassium: 4.1 mmol/L (ref 3.5–5.3)
Sodium: 135 mmol/L (ref 135–146)
Total Bilirubin: 0.2 mg/dL (ref 0.2–1.2)
Total Protein: 6.7 g/dL (ref 6.1–8.1)
eGFR: 143 mL/min/{1.73_m2} (ref >=60)

## 2021-07-15 LAB — LIPID PANEL
Cholesterol: 174 mg/dL (ref <200)
HDL: 57 mg/dL (ref >=50)
LDL Cholesterol (Calc): 101 mg/dL (calc) — ABNORMAL HIGH
Non-HDL Cholesterol (Calc): 117 mg/dL (calc) (ref <130)
Total CHOL/HDL Ratio: 3.1 (calc) (ref <5.0)
Triglycerides: 69 mg/dL (ref <150)

## 2021-07-15 LAB — HIV-1 RNA QUANT-NO REFLEX-BLD
HIV 1 RNA Quant: 12000 copies/mL — ABNORMAL HIGH
HIV-1 RNA Quant, Log: 4.08 Log copies/mL — ABNORMAL HIGH

## 2021-07-15 LAB — RPR: RPR Ser Ql: NONREACTIVE

## 2021-07-16 ENCOUNTER — Other Ambulatory Visit: Payer: Self-pay | Admitting: Infectious Disease

## 2021-07-16 DIAGNOSIS — B20 Human immunodeficiency virus [HIV] disease: Secondary | ICD-10-CM

## 2021-07-17 ENCOUNTER — Telehealth: Payer: Self-pay

## 2021-07-17 ENCOUNTER — Other Ambulatory Visit: Payer: Self-pay | Admitting: Obstetrics & Gynecology

## 2021-07-17 DIAGNOSIS — O3680X0 Pregnancy with inconclusive fetal viability, not applicable or unspecified: Secondary | ICD-10-CM

## 2021-07-17 NOTE — Telephone Encounter (Signed)
Spoke with Alyssa Sandoval, she denies any missed doses of Biktarvy. Discussed that her viral load is elevated to over 12,000 which indicates a lapse in adherence or that her [name] is no longer working for her. She reports adherence. Per chart, [name] was last dispensed 02/2021. ? ?Discussed the need for potential medication change due to pregnancy and increased viral load. She states she does not have insurance. She says she applied for ADAP, but that she is still going to have an out of pocket cost for any medication  ? ?Beryle Flock, RN ? ?

## 2021-07-17 NOTE — Telephone Encounter (Signed)
Thank you for the heads up!

## 2021-07-17 NOTE — Telephone Encounter (Signed)
Patient accepts appointment to discuss with pharmacy this Wednesday.  ? ?Beryle Flock, RN ? ?

## 2021-07-17 NOTE — Telephone Encounter (Signed)
-----   Message from Randall Hiss, MD sent at 07/16/2021  9:00 PM EDT ----- ?This is NOT good news to see her viremic and pregnant. Fortunately this is early pregnancy and not later on. Can we figure out what is going on. I am hoping she simply missed a good deal of her meds and that is reason for her virological failure. I could havev given her samples as well when she came in last week but she told me she still had meds. She will also not be able to stay on Biktarvy which I was going to let her do during pregnancy-since I believe it shoiuld be fine IF she will take it. If not we will have to shift gears. I would hat to have to have her on BID boosted PI which will not help her nausea. Boyfriend was out of meds as well when I figured out he was my patient at her visit ?----- Message ----- ?From: Interface, [facility] Results In ?Sent: 07/13/2021   4:50 PM EDT ?To: Randall Hiss, MD ? ? ?

## 2021-07-18 ENCOUNTER — Ambulatory Visit (INDEPENDENT_AMBULATORY_CARE_PROVIDER_SITE_OTHER): Payer: Medicaid Other

## 2021-07-18 DIAGNOSIS — O3680X0 Pregnancy with inconclusive fetal viability, not applicable or unspecified: Secondary | ICD-10-CM

## 2021-07-18 DIAGNOSIS — Z3A09 9 weeks gestation of pregnancy: Secondary | ICD-10-CM

## 2021-07-18 NOTE — Progress Notes (Signed)
[place] 9 wks,single IUP with YS,FHR 167 bpm,CRL 22.89 mm,normal ovaries ?

## 2021-07-19 ENCOUNTER — Telehealth: Payer: Self-pay | Admitting: Student-PharmD

## 2021-07-19 ENCOUNTER — Ambulatory Visit: Payer: Self-pay | Admitting: Pharmacist

## 2021-07-19 NOTE — Telephone Encounter (Signed)
Contacted patient regarding missed appointment this morning with Estill Bamberg. Patient agreed to be rescheduled to Friday, April 14 at 3:15pm with Estill Bamberg. Patient verbalized understanding and had no other concerns.  ?

## 2021-07-21 ENCOUNTER — Ambulatory Visit: Payer: Self-pay | Admitting: Pharmacist

## 2021-08-08 ENCOUNTER — Encounter: Payer: Self-pay | Admitting: Obstetrics & Gynecology

## 2021-08-08 ENCOUNTER — Other Ambulatory Visit: Payer: Self-pay | Admitting: Obstetrics & Gynecology

## 2021-08-08 DIAGNOSIS — O0990 Supervision of high risk pregnancy, unspecified, unspecified trimester: Secondary | ICD-10-CM | POA: Insufficient documentation

## 2021-08-08 DIAGNOSIS — O0992 Supervision of high risk pregnancy, unspecified, second trimester: Secondary | ICD-10-CM | POA: Insufficient documentation

## 2021-08-08 DIAGNOSIS — O0991 Supervision of high risk pregnancy, unspecified, first trimester: Secondary | ICD-10-CM | POA: Insufficient documentation

## 2021-08-08 DIAGNOSIS — Z3682 Encounter for antenatal screening for nuchal translucency: Secondary | ICD-10-CM

## 2021-08-09 ENCOUNTER — Ambulatory Visit (INDEPENDENT_AMBULATORY_CARE_PROVIDER_SITE_OTHER): Payer: Medicaid Other | Admitting: Obstetrics & Gynecology

## 2021-08-09 ENCOUNTER — Ambulatory Visit (INDEPENDENT_AMBULATORY_CARE_PROVIDER_SITE_OTHER): Payer: Medicaid Other

## 2021-08-09 ENCOUNTER — Encounter: Payer: Self-pay | Admitting: Obstetrics & Gynecology

## 2021-08-09 ENCOUNTER — Ambulatory Visit: Payer: Medicaid Other | Admitting: *Deleted

## 2021-08-09 ENCOUNTER — Other Ambulatory Visit (HOSPITAL_COMMUNITY)
Admission: RE | Admit: 2021-08-09 | Discharge: 2021-08-09 | Disposition: A | Discharge status: Home / Self Care | Payer: Medicaid Other | Source: Ambulatory Visit | Attending: Obstetrics & Gynecology | Admitting: Obstetrics & Gynecology

## 2021-08-09 VITALS — BP 115/85 | HR 94 | Wt 133.0 lb

## 2021-08-09 DIAGNOSIS — Z3A12 12 weeks gestation of pregnancy: Secondary | ICD-10-CM

## 2021-08-09 DIAGNOSIS — A609 Anogenital herpesviral infection, unspecified: Secondary | ICD-10-CM

## 2021-08-09 DIAGNOSIS — Z124 Encounter for screening for malignant neoplasm of cervix: Secondary | ICD-10-CM | POA: Diagnosis present

## 2021-08-09 DIAGNOSIS — Z3682 Encounter for antenatal screening for nuchal translucency: Secondary | ICD-10-CM

## 2021-08-09 DIAGNOSIS — Z113 Encounter for screening for infections with a predominantly sexual mode of transmission: Secondary | ICD-10-CM | POA: Diagnosis present

## 2021-08-09 DIAGNOSIS — O0991 Supervision of high risk pregnancy, unspecified, first trimester: Secondary | ICD-10-CM | POA: Insufficient documentation

## 2021-08-09 DIAGNOSIS — Z1379 Encounter for other screening for genetic and chromosomal anomalies: Secondary | ICD-10-CM

## 2021-08-09 DIAGNOSIS — B20 Human immunodeficiency virus [HIV] disease: Secondary | ICD-10-CM

## 2021-08-09 LAB — POCT URINALYSIS DIPSTICK OB
Blood, UA: NEGATIVE
Glucose, UA: NEGATIVE
Ketones, UA: NEGATIVE
Leukocytes, UA: NEGATIVE
Nitrite, UA: POSITIVE
POC,PROTEIN,UA: NEGATIVE

## 2021-08-09 LAB — HEPATITIS C ANTIBODY: HCV Ab: NEGATIVE

## 2021-08-09 MED ORDER — ASPIRIN EC 81 MG PO TBEC: 81.0000 mg | DELAYED_RELEASE_TABLET | Freq: Every day | ORAL | 11 refills | Status: DC

## 2021-08-09 NOTE — Progress Notes (Addendum)
[place] 12+1 wks,measurements c/w dates,CRL 62.40 mm,FHR 157 bpm,normal ovaries,NB present,NT 1.2 mm ?

## 2021-08-09 NOTE — Progress Notes (Signed)
? ?INITIAL OBSTETRICAL VISIT ?Patient name: Alyssa Sandoval MRN [id number]  Date of birth: 2000/07/14 ?Chief Complaint:   ?Initial Prenatal Visit ? ?History of Present Illness:   ?Alyssa Sandoval is a 21 y.o. [place] female at [redacted]w[redacted]d by [place] at 9 weeks with an Estimated Date of Delivery: 02/20/22 being seen today for her initial obstetrical visit.   ?Her obstetrical history is significant for the following: ? ?1) HIV ?-followed by ID ?-last viral load 12,000 ? 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 ?Today she reports no complaints.  ? ?  08/09/2021  ?  3:55 PM 05/02/2021  ?  8:32 AM 03/15/2021  ?  9:39 AM 10/19/2020  ?  2:11 PM 05/24/2017  ? 10:38 AM  ?Depression screen PHQ 2/9  ?Decreased Interest 1 0 0 3 2  ?Down, Depressed, Hopeless 0 0 1 2 0  ?PHQ - 2 Score 1 0 1 5 2   ?Altered sleeping 1   3 3   ?Tired, decreased energy 1   3 3   ?Change in appetite 1   2 3   ?Feeling bad or failure about yourself  0   2 0  ?Trouble concentrating 0   0 0  ?Moving slowly or fidgety/restless 0   2 0  ?Suicidal thoughts 0   0 0  ?PHQ-9 Score 4   17 11   ?Difficult doing work/chores     Somewhat difficult  ? ? ?No LMP recorded (lmp unknown). Patient is pregnant. ?Last pap collected today.  ?Review of Systems:   ?Pertinent items are noted in HPI ?Denies cramping/contractions, leakage of fluid, vaginal bleeding, abnormal vaginal discharge w/ itching/odor/irritation, headaches, visual changes, shortness of breath, chest pain, abdominal pain, severe nausea/vomiting, or problems with urination or bowel movements unless otherwise stated above.  ?Pertinent History Reviewed:  ?Reviewed past medical,surgical, social, obstetrical and family history.  ?Reviewed problem list, medications and allergies. ?OB History  ?Gravida Para Term Preterm AB Living  ?1 0 0 0 0 0  ?SAB IAB Ectopic Multiple Live Births  ?0 0 0 0 0  ?  ?# Outcome Date GA Lbr Len/2nd Weight Sex Delivery Anes PTL Lv  ?1 Current           ? ?Physical  Assessment:  ? ?Vitals:  ? 08/09/21 1459  ?BP: 115/85  ?Pulse: 94  ?Weight: 133 lb (60.3 kg)  ?Body mass index is 27.8 kg/m?. ? ?     Physical Examination: ? General appearance - well appearing, and in no distress ? Mental status - alert, oriented to person, place, and time ? Psych:  She has a normal mood and affect ? Skin - warm and dry, normal color, no suspicious lesions noted ? Chest - effort normal, all lung fields clear to auscultation bilaterally ? Heart - normal rate and regular rhythm ? Abdomen - soft, nontender ? Extremities:  No swelling or varicosities noted ? Pelvic - VULVA: normal appearing vulva with no masses, tenderness or lesions  VAGINA: normal appearing vagina with normal color and discharge, no lesions  CERVIX: normal appearing cervix without discharge or lesions, no CMT ? Thin prep pap is done with HR HPV cotesting ? ?Chaperone: Alyssa Sandoval   ? ?TODAY'S NT - to be completed ? ?[place] 12+1 wks,measurements c/w dates,CRL 62.40 mm,FHR 157 bpm,normal ovaries,NB present,NT 1.2 mm ? ?Results for orders placed or performed in visit on 08/09/21 (from the past 24 hour(s))  ?POC Urinalysis Dipstick OB  ? Collection Time: 08/09/21  4:03 PM  ?  Result Value Ref Range  ? Color, UA    ? Clarity, UA    ? Glucose, UA Negative Negative  ? Bilirubin, UA    ? Ketones, UA neg   ? Spec Grav, UA    ? Blood, UA neg   ? pH, UA    ? POC,PROTEIN,UA Negative Negative, Trace, Small (1+), Moderate (2+), Large (3+), 4+  ? Urobilinogen, UA    ? Nitrite, UA positive   ? Leukocytes, UA Negative Negative  ? Appearance    ? Odor    ?  ?Assessment & Plan:  ?1) High-Risk Pregnancy G1P0000 at [redacted]w[redacted]d with an Estimated Date of Delivery: 02/20/22  ? ?2) Initial OB visit ?-plan to start ASA daily ? ?3) HIV ?-discussed important of continuing medication and follow up with ID provider ?-routine OB care ?-route of delivery dependent upon viral load and will plan to treat in labor- reviewed that we will discuss in more details later in  pregnancy ? ?4) HSV ?-briefly discussed suppression therapy at 36wks ? ? ?Meds: No orders of the defined types were placed in this encounter. ? ? ?Initial labs obtained ?Continue prenatal vitamins ?Reviewed n/v relief measures and warning s/s to report ?Reviewed recommended weight gain based on pre-gravid BMI ?Encouraged well-balanced diet ?Genetic & carrier screening discussed: requests Panorama,  ?Ultrasound discussed; fetal survey: ordered ?CCNC completed> form faxed if has or is planning to apply for medicaid ?The nature of [place] for [facility] with multiple MDs and other Advanced Practice Providers was explained to patient; also emphasized that fellows, residents, and students are part of our team. ?Pt has home bp cuff.  ? ?Indications for ASA therapy (per uptodate) ?One of the following: ?H/O preeclampsia, especially early onset/adverse outcome No ?Multifetal gestation No ?CHTN No ?T1DM or T2DM No ?Chronic kidney disease No ?Autoimmune disease (antiphospholipid syndrome, systemic lupus erythematosus) No ? ?OR Two or more of the following: ?Nulliparity Yes ?Obesity (BMI>30 kg/m2) No ?Family h/o preeclampsia in mother or sister No ?Age ?35 years No ?Sociodemographic characteristics (African American race, low socioeconomic level) Yes ?Personal risk factors (eg, previous pregnancy w/ LBW or SGA, previous adverse pregnancy outcome [eg, stillbirth], interval >10 years between pregnancies) No ? ?Indications for early A1C (per uptodate) ?BMI >=25 (>=23 in Asian women) AND one of the following ?GDM in a previous pregnancy No ?Previous A1C?5.7, impaired glucose tolerance, or impaired fasting glucose on previous testing No ?First-degree relative with diabetes No ?High-risk race/ethnicity (eg, African American, Latino, Native American, [place] American, [place]) Yes ?History of cardiovascular disease No ?HTN or on therapy for hypertension No ?HDL cholesterol level <35 mg/dL (0.90 mmol/L)  and/or a triglyceride level >250 mg/dL (2.82 mmol/L) No ?PCOS No ?Physical inactivity No ?Other clinical condition associated with insulin resistance (eg, severe obesity, acanthosis nigricans) No ?Previous birth of an infant weighing ?4000 g No ?Previous stillbirth of unknown cause No ?>= 40yo No ? ?Follow-up: Return in about 4 weeks (around 09/06/2021) for [place] visit.  ? ?Orders Placed This Encounter  ?Procedures  ? Urine Culture  ? Genetic Screening  ? Hepatitis B Surface AntiGEN  ? Hepatitis C antibody  ? Rubella screen  ? POC Urinalysis Dipstick OB  ? ABO/Rh  ? Antibody screen  ? ? ?Annalee Genta CNM, WHNP-BC ?08/09/2021 ?4:16 PM  ?

## 2021-08-10 LAB — HEPATITIS B SURFACE ANTIGEN: Hepatitis B Surface Ag: NEGATIVE

## 2021-08-10 LAB — ANTIBODY SCREEN: Antibody Screen: NEGATIVE

## 2021-08-10 LAB — ABO/RH: Rh Factor: POSITIVE

## 2021-08-10 LAB — RUBELLA SCREEN: Rubella Antibodies, IGG: 2.62 index (ref >0.99)

## 2021-08-10 LAB — HEPATITIS C ANTIBODY: Hep C Virus Ab: NONREACTIVE

## 2021-08-13 ENCOUNTER — Other Ambulatory Visit: Payer: Self-pay | Admitting: Obstetrics & Gynecology

## 2021-08-13 DIAGNOSIS — O2340 Unspecified infection of urinary tract in pregnancy, unspecified trimester: Secondary | ICD-10-CM

## 2021-08-13 LAB — URINE CULTURE

## 2021-08-13 MED ORDER — AMOXICILLIN-POT CLAVULANATE 875-125 MG PO TABS
1.0000 | ORAL_TABLET | Freq: Two times a day (BID) | ORAL | 0 refills | Status: AC
Start: 2021-08-13 — End: 2021-08-18

## 2021-08-13 MED ORDER — AMOXICILLIN-POT CLAVULANATE 875-125 MG PO TABS: 1.0000 | ORAL_TABLET | Freq: Two times a day (BID) | ORAL | 0 refills | Status: DC

## 2021-08-13 NOTE — Progress Notes (Signed)
Rx for Augmentin due to UTI ?

## 2021-08-14 LAB — INTEGRATED 1
Crown Rump Length: 62.4 mm
Gest. Age on Collection Date: 12.4 weeks
Maternal Age at EDD: 22.3 yr
Nuchal Translucency (NT): 1.2 mm
Number of Fetuses: 1
PAPP-A Value: 1743.2 ng/mL

## 2021-08-14 LAB — CYTOLOGY - PAP
Chlamydia: NEGATIVE
Comment: NEGATIVE
Comment: NORMAL
Diagnosis: NEGATIVE
Neisseria Gonorrhea: NEGATIVE

## 2021-08-19 ENCOUNTER — Inpatient Hospital Stay (HOSPITAL_COMMUNITY)
Admission: AD | Admit: 2021-08-19 | Discharge: 2021-08-21 | DRG: 832 | Disposition: A | Discharge status: Home / Self Care | Payer: Medicaid Other | Attending: Obstetrics and Gynecology | Admitting: Obstetrics and Gynecology

## 2021-08-19 ENCOUNTER — Other Ambulatory Visit: Payer: Self-pay

## 2021-08-19 ENCOUNTER — Encounter (HOSPITAL_COMMUNITY): Payer: Self-pay | Admitting: Obstetrics & Gynecology

## 2021-08-19 DIAGNOSIS — Z3A13 13 weeks gestation of pregnancy: Secondary | ICD-10-CM

## 2021-08-19 DIAGNOSIS — Z87891 Personal history of nicotine dependence: Secondary | ICD-10-CM

## 2021-08-19 DIAGNOSIS — O2302 Infections of kidney in pregnancy, second trimester: Principal | ICD-10-CM

## 2021-08-19 DIAGNOSIS — Z21 Asymptomatic human immunodeficiency virus [HIV] infection status: Secondary | ICD-10-CM | POA: Diagnosis present

## 2021-08-19 DIAGNOSIS — O2301 Infections of kidney in pregnancy, first trimester: Principal | ICD-10-CM | POA: Diagnosis present

## 2021-08-19 DIAGNOSIS — M549 Dorsalgia, unspecified: Secondary | ICD-10-CM | POA: Diagnosis present

## 2021-08-19 DIAGNOSIS — O98711 Human immunodeficiency virus [HIV] disease complicating pregnancy, first trimester: Secondary | ICD-10-CM | POA: Diagnosis present

## 2021-08-19 DIAGNOSIS — N12 Tubulo-interstitial nephritis, not specified as acute or chronic: Secondary | ICD-10-CM | POA: Diagnosis present

## 2021-08-19 DIAGNOSIS — Z7982 Long term (current) use of aspirin: Secondary | ICD-10-CM | POA: Diagnosis not present

## 2021-08-19 DIAGNOSIS — Z8616 Personal history of COVID-19: Secondary | ICD-10-CM

## 2021-08-19 HISTORY — DX: Asymptomatic human immunodeficiency virus (hiv) infection status: Z21

## 2021-08-19 HISTORY — DX: Human immunodeficiency virus (HIV) disease: B20

## 2021-08-19 HISTORY — DX: Tubulo-interstitial nephritis, not specified as acute or chronic: N12

## 2021-08-19 LAB — CBC WITH DIFFERENTIAL/PLATELET
Abs Immature Granulocytes: 0.04 10*3/uL (ref 0.00–0.07)
Basophils Absolute: 0 10*3/uL (ref 0.0–0.1)
Basophils Relative: 0 %
Eosinophils Absolute: 0 10*3/uL (ref 0.0–0.5)
Eosinophils Relative: 0 %
HCT: 33.4 % — ABNORMAL LOW (ref 36.0–46.0)
Hemoglobin: 11.6 g/dL — ABNORMAL LOW (ref 12.0–15.0)
Immature Granulocytes: 0 %
Lymphocytes Relative: 8 %
Lymphs Abs: 1.4 10*3/uL (ref 0.7–4.0)
MCH: 29.1 pg (ref 26.0–34.0)
MCHC: 34.7 g/dL (ref 30.0–36.0)
MCV: 83.7 fL (ref 80.0–100.0)
Monocytes Absolute: 1.1 10*3/uL — ABNORMAL HIGH (ref 0.1–1.0)
Monocytes Relative: 7 %
Neutro Abs: 14.5 10*3/uL — ABNORMAL HIGH (ref 1.7–7.7)
Neutrophils Relative %: 85 %
Platelets: 291 10*3/uL (ref 150–400)
RBC: 3.99 MIL/uL (ref 3.87–5.11)
RDW: 14.6 % (ref 11.5–15.5)
WBC: 17.1 10*3/uL — ABNORMAL HIGH (ref 4.0–10.5)
nRBC: 0 % (ref 0.0–0.2)

## 2021-08-19 LAB — COMPREHENSIVE METABOLIC PANEL
ALT: 18 U/L (ref 0–44)
AST: 16 U/L (ref 15–41)
Albumin: 3.2 g/dL — ABNORMAL LOW (ref 3.5–5.0)
Alkaline Phosphatase: 66 U/L (ref 38–126)
Anion gap: 10 (ref 5–15)
BUN: <5 mg/dL — ABNORMAL LOW (ref 6–20)
CO2: 21 mmol/L — ABNORMAL LOW (ref 22–32)
Calcium: 8.9 mg/dL (ref 8.9–10.3)
Chloride: 103 mmol/L (ref 98–111)
Creatinine, Ser: 0.57 mg/dL (ref 0.44–1.00)
GFR, Estimated: >60 mL/min (ref >60)
Glucose, Bld: 93 mg/dL (ref 70–99)
Potassium: 3.2 mmol/L — ABNORMAL LOW (ref 3.5–5.1)
Sodium: 134 mmol/L — ABNORMAL LOW (ref 135–145)
Total Bilirubin: 0.4 mg/dL (ref 0.3–1.2)
Total Protein: 6.6 g/dL (ref 6.5–8.1)

## 2021-08-19 LAB — URINALYSIS, ROUTINE W REFLEX MICROSCOPIC
Bilirubin Urine: NEGATIVE
Glucose, UA: NEGATIVE mg/dL
Hgb urine dipstick: NEGATIVE
Ketones, ur: 80 mg/dL — AB
Nitrite: POSITIVE — AB
Protein, ur: 30 mg/dL — AB
Specific Gravity, Urine: 1.014 (ref 1.005–1.030)
WBC, UA: >50 WBC/hpf — ABNORMAL HIGH (ref 0–5)
pH: 5 (ref 5.0–8.0)

## 2021-08-19 LAB — LACTIC ACID, PLASMA: Lactic Acid, Venous: 0.7 mmol/L (ref 0.5–1.9)

## 2021-08-19 LAB — TYPE AND SCREEN
ABO/RH(D): AB POS
Antibody Screen: NEGATIVE

## 2021-08-19 MED ORDER — ONDANSETRON HCL 4 MG PO TABS: 4.0000 mg | ORAL_TABLET | Freq: Four times a day (QID) | ORAL | Status: DC | PRN

## 2021-08-19 MED ORDER — ACETAMINOPHEN 325 MG PO TABS
650.0000 mg | ORAL_TABLET | ORAL | Status: DC | PRN
  Administered 2021-08-19: 650 mg via ORAL
  Filled 2021-08-19 (×2): qty 2

## 2021-08-19 MED ORDER — LACTATED RINGERS IV BOLUS
1000.0000 mL | Freq: Once | INTRAVENOUS | Status: AC
  Administered 2021-08-19: 1000 mL via INTRAVENOUS

## 2021-08-19 MED ORDER — BICTEGRAVIR-EMTRICITAB-TENOFOV 50-200-25 MG PO TABS
1.0000 | ORAL_TABLET | Freq: Every day | ORAL | Status: DC
  Administered 2021-08-19 – 2021-08-21 (×3): 1 via ORAL
  Filled 2021-08-19 (×3): qty 1

## 2021-08-19 MED ORDER — OXYCODONE HCL 5 MG PO TABS
5.0000 mg | ORAL_TABLET | Freq: Four times a day (QID) | ORAL | Status: DC | PRN
  Administered 2021-08-19 – 2021-08-20 (×3): 5 mg via ORAL
  Filled 2021-08-19 (×3): qty 1

## 2021-08-19 MED ORDER — DOCUSATE SODIUM 100 MG PO CAPS
100.0000 mg | ORAL_CAPSULE | Freq: Every day | ORAL | Status: DC
  Administered 2021-08-20: 100 mg via ORAL
  Filled 2021-08-19 (×2): qty 1

## 2021-08-19 MED ORDER — SODIUM CHLORIDE 0.9 % IV SOLN
2.0000 g | INTRAVENOUS | Status: DC
  Administered 2021-08-19 – 2021-08-21 (×3): 2 g via INTRAVENOUS
  Filled 2021-08-19 (×3): qty 20

## 2021-08-19 MED ORDER — SODIUM CHLORIDE 0.9 % IV SOLN: INTRAVENOUS | Status: DC

## 2021-08-19 MED ORDER — BICTEGRAVIR-EMTRICITAB-TENOFOV 50-200-25 MG PO TABS
1.0000 | ORAL_TABLET | Freq: Every day | ORAL | Status: DC
Start: 2021-08-19 — End: 2021-08-19

## 2021-08-19 MED ORDER — ASPIRIN EC 81 MG PO TBEC
81.0000 mg | DELAYED_RELEASE_TABLET | Freq: Once | ORAL | Status: DC
  Filled 2021-08-19: qty 1

## 2021-08-19 MED ORDER — LACTATED RINGERS IV BOLUS (SEPSIS): 1000.0000 mL | Freq: Once | INTRAVENOUS | Status: DC

## 2021-08-19 MED ORDER — ZOLPIDEM TARTRATE 5 MG PO TABS: 5.0000 mg | ORAL_TABLET | Freq: Every evening | ORAL | Status: DC | PRN

## 2021-08-19 MED ORDER — PRENATAL MULTIVITAMIN CH
1.0000 | ORAL_TABLET | Freq: Every day | ORAL | Status: DC
  Administered 2021-08-20: 1 via ORAL
  Filled 2021-08-19: qty 1

## 2021-08-19 MED ORDER — CALCIUM CARBONATE ANTACID 500 MG PO CHEW: 2.0000 | CHEWABLE_TABLET | ORAL | Status: DC | PRN

## 2021-08-19 MED ORDER — ONDANSETRON HCL 4 MG/2ML IJ SOLN
4.0000 mg | Freq: Four times a day (QID) | INTRAMUSCULAR | Status: DC | PRN
  Administered 2021-08-19 – 2021-08-20 (×3): 4 mg via INTRAVENOUS
  Filled 2021-08-19 (×3): qty 2

## 2021-08-19 NOTE — MAU Provider Note (Signed)
?History  ?  ? ?CSN: [id number] ? ?Arrival date and time: 08/19/21 1121 ? ? Event Date/Time  ? First Provider Initiated Contact with Patient 08/19/21 1150   ?  ? ?Chief Complaint  ?Patient presents with  ? Back Pain  ? Nausea  ? Emesis  ? ?HPI ?Alyssa Sandoval is a 21 y.o. G2P0010 at [redacted]w[redacted]d who presents with back pain, nausea and vomiting. She reports she has had intermittent back pain since 5/3 but it got worse the last 2-3 days. She reports the pain is on the left side and is continuously a 10/10. She has tried tyelnol with no relief. She also reports feeling flushed and vomiting after for the last 2 days. She denies any fever or dysuria. She reports she was unaware that she has a UTI on 5/7 and has not taken any medication for the pain. ? ?OB History   ? ? Gravida  ?2  ? Para  ?0  ? Term  ?0  ? Preterm  ?0  ? AB  ?1  ? Living  ?0  ?  ? ? SAB  ?1  ? IAB  ?0  ? Ectopic  ?0  ? Multiple  ?0  ? Live Births  ?0  ?   ?  ?  ? ? ?Past Medical History:  ?Diagnosis Date  ? Chronic constipation 06/10/2012  ? COVID-19 04/2020  ? Dysfunctional uterine bleeding   ? GERD (gastroesophageal reflux disease) 06/10/2012  ? Headache 12/09/2020  ? High-risk pregnancy 07/13/2021  ? HIV (human immunodeficiency virus infection) (HCC)   ? Low back pain 12/09/2020  ? Passive suicidal ideations 11/07/2020  ? Vaginal pruritus 03/15/2021  ? Vaginal yeast infection   ? ? ?Past Surgical History:  ?Procedure Laterality Date  ? NO PAST SURGERIES    ? ? ?Family History  ?Problem Relation Age of Onset  ? Obesity Mother   ? Fibromyalgia Mother   ? Mental illness Mother   ? Hypertension Maternal Grandmother   ? Colon cancer Maternal Grandfather   ? ? ?Social History  ? ?Tobacco Use  ? Smoking status: Former  ?  Types: Cigars, E-cigarettes  ? Smokeless tobacco: Never  ? Tobacco comments:  ?  vapes  ?Vaping Use  ? Vaping Use: Never used  ?Substance Use Topics  ? Alcohol use: No  ? Drug use: Not Currently  ?  Types: Marijuana  ? ? ?Allergies: No Known  Allergies ? ?Medications Prior to Admission  ?Medication Sig Dispense Refill Last Dose  ? bictegravir-emtricitabine-tenofovir AF (BIKTARVY) 50-200-25 MG TABS tablet Take 1 tablet by mouth daily. 30 tablet 11 08/18/2021  ? Prenatal Vit-Fe Fumarate-FA (PRENATAL MULTIVITAMIN) TABS tablet Take 1 tablet by mouth daily at 12 noon.   08/18/2021  ? aspirin EC 81 MG tablet Take 1 tablet (81 mg total) by mouth daily. Swallow whole. 30 tablet 11   ? escitalopram (LEXAPRO) 5 MG tablet Take 1 tablet (5 mg total) by mouth daily. (Patient not taking: Reported on 07/04/2021) 30 tablet 4   ? QUEtiapine (SEROQUEL) 50 MG tablet Take 1 tablet (50 mg total) by mouth at bedtime. (Patient not taking: Reported on 07/04/2021) 90 tablet 3   ? ? ?Review of Systems  ?Constitutional: Negative.  Negative for fatigue and fever.  ?HENT: Negative.    ?Respiratory: Negative.  Negative for shortness of breath.   ?Cardiovascular: Negative.  Negative for chest pain.  ?Gastrointestinal:  Positive for nausea and vomiting. Negative for abdominal pain, constipation and diarrhea.  ?Genitourinary:   Negative.  Negative for dysuria, vaginal bleeding and vaginal discharge.  ?Musculoskeletal:  Positive for back pain.  ?Neurological: Negative.  Negative for dizziness and headaches.  ?Physical Exam  ? ?Blood pressure 114/80, pulse (!) 117, temperature 99 ?F (37.2 ?C), temperature source Oral, resp. rate 18, height 4' 10" (1.473 m), weight 58.6 kg. ? ?Physical Exam ?Vitals and nursing note reviewed.  ?Constitutional:   ?   General: She is not in acute distress. ?   Appearance: She is well-developed. She is ill-appearing.  ?HENT:  ?   Head: Normocephalic.  ?Eyes:  ?   Pupils: Pupils are equal, round, and reactive to light.  ?Cardiovascular:  ?   Rate and Rhythm: Regular rhythm. Tachycardia present.  ?   Heart sounds: Normal heart sounds.  ?Pulmonary:  ?   Effort: Pulmonary effort is normal. No respiratory distress.  ?   Breath sounds: Normal breath sounds.  ?Abdominal:   ?   General: Bowel sounds are normal. There is no distension.  ?   Palpations: Abdomen is soft.  ?   Tenderness: There is no abdominal tenderness. There is left CVA tenderness.  ?Skin: ?   General: Skin is warm and dry.  ?Neurological:  ?   Mental Status: She is alert and oriented to person, place, and time.  ?Psychiatric:     ?   Mood and Affect: Mood normal.     ?   Behavior: Behavior normal.     ?   Thought Content: Thought content normal.     ?   Judgment: Judgment normal.  ? ?FHT: 164 bpm ? ?MAU Course  ?Procedures ?Results for orders placed or performed during the hospital encounter of 08/19/21 (from the past 24 hour(s))  ?Urinalysis, Routine w reflex microscopic Urine, Clean Catch     Status: Abnormal  ? Collection Time: 08/19/21 11:46 AM  ?Result Value Ref Range  ? Color, Urine AMBER (A) YELLOW  ? APPearance CLOUDY (A) CLEAR  ? Specific Gravity, Urine 1.014 1.005 - 1.030  ? pH 5.0 5.0 - 8.0  ? Glucose, UA NEGATIVE NEGATIVE mg/dL  ? Hgb urine dipstick NEGATIVE NEGATIVE  ? Bilirubin Urine NEGATIVE NEGATIVE  ? Ketones, ur 80 (A) NEGATIVE mg/dL  ? Protein, ur 30 (A) NEGATIVE mg/dL  ? Nitrite POSITIVE (A) NEGATIVE  ? Leukocytes,Ua LARGE (A) NEGATIVE  ? RBC / HPF 11-20 0 - 5 RBC/hpf  ? WBC, UA >50 (H) 0 - 5 WBC/hpf  ? Bacteria, UA MANY (A) NONE SEEN  ? Squamous Epithelial / LPF 6-10 0 - 5  ? WBC Clumps PRESENT   ? Mucus PRESENT   ?Type and screen [facility]     Status: None (Preliminary result)  ? Collection Time: 08/19/21 12:34 PM  ?Result Value Ref Range  ? ABO/RH(D) PENDING   ? Antibody Screen PENDING   ? Sample Expiration    ?  08/22/2021,2359 ?Performed at  Hospital Lab, [address] [place], [place] [address] ?  ?CBC with Differential/Platelet     Status: Abnormal  ? Collection Time: 08/19/21 12:35 PM  ?Result Value Ref Range  ? WBC 17.1 (H) 4.0 - 10.5 K/uL  ? RBC 3.99 3.87 - 5.11 MIL/uL  ? Hemoglobin 11.6 (L) 12.0 - 15.0 g/dL  ? HCT 33.4 (L) 36.0 - 46.0 %  ? MCV 83.7 80.0 - 100.0  fL  ? MCH 29.1 26.0 - 34.0 pg  ? MCHC 34.7 30.0 - 36.0 g/dL  ? RDW 14.6 11.5 -   15.5 %  ? Platelets 291 150 - 400 K/uL  ? nRBC 0.0 0.0 - 0.2 %  ? Neutrophils Relative % 85 %  ? Neutro Abs 14.5 (H) 1.7 - 7.7 K/uL  ? Lymphocytes Relative 8 %  ? Lymphs Abs 1.4 0.7 - 4.0 K/uL  ? Monocytes Relative 7 %  ? Monocytes Absolute 1.1 (H) 0.1 - 1.0 K/uL  ? Eosinophils Relative 0 %  ? Eosinophils Absolute 0.0 0.0 - 0.5 K/uL  ? Basophils Relative 0 %  ? Basophils Absolute 0.0 0.0 - 0.1 K/uL  ? Immature Granulocytes 0 %  ? Abs Immature Granulocytes 0.04 0.00 - 0.07 K/uL  ?  ?MDM ?UA ?CBC with Diff ?CMP ?Type and Screen ? ?LR bolus- difficult IV stick, IV team consulted ? ?Dr. Ozan notified of patient arrival to MAU and complaint with results- will admit to [facility] for IV antibiotics ? ?Assessment and Plan  ? ?1. Pyelonephritis affecting pregnancy in second trimester   ?2. [redacted] weeks gestation of pregnancy   ? ?-Admit to [facility] ?-Care turned over to MD ? ?Magdelyn Roebuck M Ivan Lacher CNM ?08/19/2021, 11:50 AM  ?

## 2021-08-19 NOTE — H&P (Signed)
?History  ?  ? ?CSN: [id number] ? ?Arrival date and time: 08/19/21 1121 ? ? Event Date/Time  ? First Provider Initiated Contact with Patient 08/19/21 1150   ?  ? ?Chief Complaint  ?Patient presents with  ? Back Pain  ? Nausea  ? Emesis  ? ?HPI ?Alyssa Sandoval is a 21 y.o. G2P0010 at [redacted]w[redacted]d who presents with back pain, nausea and vomiting. She reports she has had intermittent back pain since 5/3 but it got worse the last 2-3 days. She reports the pain is on the left side and is continuously a 10/10. She has tried tyelnol with no relief. She also reports feeling flushed and vomiting after for the last 2 days. She denies any fever or dysuria. She reports she was unaware that she has a UTI on 5/7 and has not taken any medication for the pain. ? ?OB History   ? ? Gravida  ?2  ? Para  ?0  ? Term  ?0  ? Preterm  ?0  ? AB  ?1  ? Living  ?0  ?  ? ? SAB  ?1  ? IAB  ?0  ? Ectopic  ?0  ? Multiple  ?0  ? Live Births  ?0  ?   ?  ?  ? ? ?Past Medical History:  ?Diagnosis Date  ? Chronic constipation 06/10/2012  ? COVID-19 04/2020  ? Dysfunctional uterine bleeding   ? GERD (gastroesophageal reflux disease) 06/10/2012  ? Headache 12/09/2020  ? High-risk pregnancy 07/13/2021  ? HIV (human immunodeficiency virus infection) ([place])   ? Low back pain 12/09/2020  ? Passive suicidal ideations 11/07/2020  ? Vaginal pruritus 03/15/2021  ? Vaginal yeast infection   ? ? ?Past Surgical History:  ?Procedure Laterality Date  ? NO PAST SURGERIES    ? ? ?Family History  ?Problem Relation Age of Onset  ? Obesity Mother   ? Fibromyalgia Mother   ? Mental illness Mother   ? Hypertension Maternal Grandmother   ? Colon cancer Maternal Grandfather   ? ? ?Social History  ? ?Tobacco Use  ? Smoking status: Former  ?  Types: Cigars, E-cigarettes  ? Smokeless tobacco: Never  ? Tobacco comments:  ?  vapes  ?Vaping Use  ? Vaping Use: Never used  ?Substance Use Topics  ? Alcohol use: No  ? Drug use: Not Currently  ?  Types: Marijuana  ? ? ?Allergies: No Known  Allergies ? ?Medications Prior to Admission  ?Medication Sig Dispense Refill Last Dose  ? bictegravir-emtricitabine-tenofovir AF (BIKTARVY) 50-200-25 MG TABS tablet Take 1 tablet by mouth daily. 30 tablet 11 08/18/2021  ? Prenatal Vit-Fe Fumarate-FA (PRENATAL MULTIVITAMIN) TABS tablet Take 1 tablet by mouth daily at 12 noon.   08/18/2021  ? aspirin EC 81 MG tablet Take 1 tablet (81 mg total) by mouth daily. Swallow whole. 30 tablet 11   ? escitalopram (LEXAPRO) 5 MG tablet Take 1 tablet (5 mg total) by mouth daily. (Patient not taking: Reported on 07/04/2021) 30 tablet 4   ? QUEtiapine (SEROQUEL) 50 MG tablet Take 1 tablet (50 mg total) by mouth at bedtime. (Patient not taking: Reported on 07/04/2021) 90 tablet 3   ? ? ?Review of Systems  ?Constitutional: Negative.  Negative for fatigue and fever.  ?HENT: Negative.    ?Respiratory: Negative.  Negative for shortness of breath.   ?Cardiovascular: Negative.  Negative for chest pain.  ?Gastrointestinal:  Positive for nausea and vomiting. Negative for abdominal pain, constipation and diarrhea.  ?Genitourinary:  Negative.  Negative for dysuria, vaginal bleeding and vaginal discharge.  ?Musculoskeletal:  Positive for back pain.  ?Neurological: Negative.  Negative for dizziness and headaches.  ?Physical Exam  ? ?Blood pressure 114/80, pulse (!) 117, temperature 99 ?F (37.2 ?C), temperature source Oral, resp. rate 18, height 4\' 10"  (1.473 m), weight 58.6 kg. ? ?Physical Exam ?Vitals and nursing note reviewed.  ?Constitutional:   ?   General: She is not in acute distress. ?   Appearance: She is well-developed. She is ill-appearing.  ?HENT:  ?   Head: Normocephalic.  ?Eyes:  ?   Pupils: Pupils are equal, round, and reactive to light.  ?Cardiovascular:  ?   Rate and Rhythm: Regular rhythm. Tachycardia present.  ?   Heart sounds: Normal heart sounds.  ?Pulmonary:  ?   Effort: Pulmonary effort is normal. No respiratory distress.  ?   Breath sounds: Normal breath sounds.  ?Abdominal:   ?   General: Bowel sounds are normal. There is no distension.  ?   Palpations: Abdomen is soft.  ?   Tenderness: There is no abdominal tenderness. There is left CVA tenderness.  ?Skin: ?   General: Skin is warm and dry.  ?Neurological:  ?   Mental Status: She is alert and oriented to person, place, and time.  ?Psychiatric:     ?   Mood and Affect: Mood normal.     ?   Behavior: Behavior normal.     ?   Thought Content: Thought content normal.     ?   Judgment: Judgment normal.  ? ?FHT: 164 bpm ? ?MAU Course  ?Procedures ?Results for orders placed or performed during the hospital encounter of 08/19/21 (from the past 24 hour(s))  ?Urinalysis, Routine w reflex microscopic Urine, Clean Catch     Status: Abnormal  ? Collection Time: 08/19/21 11:46 AM  ?Result Value Ref Range  ? Color, Urine AMBER (A) YELLOW  ? APPearance CLOUDY (A) CLEAR  ? Specific Gravity, Urine 1.014 1.005 - 1.030  ? pH 5.0 5.0 - 8.0  ? Glucose, UA NEGATIVE NEGATIVE mg/dL  ? Hgb urine dipstick NEGATIVE NEGATIVE  ? Bilirubin Urine NEGATIVE NEGATIVE  ? Ketones, ur 80 (A) NEGATIVE mg/dL  ? Protein, ur 30 (A) NEGATIVE mg/dL  ? Nitrite POSITIVE (A) NEGATIVE  ? Leukocytes,Ua LARGE (A) NEGATIVE  ? RBC / HPF 11-20 0 - 5 RBC/hpf  ? WBC, UA >50 (H) 0 - 5 WBC/hpf  ? Bacteria, UA MANY (A) NONE SEEN  ? Squamous Epithelial / LPF 6-10 0 - 5  ? WBC Clumps PRESENT   ? Mucus PRESENT   ?Type and screen [place]     Status: None (Preliminary result)  ? Collection Time: 08/19/21 12:34 PM  ?Result Value Ref Range  ? ABO/RH(D) PENDING   ? Antibody Screen PENDING   ? Sample Expiration    ?  08/22/2021,2359 ?Performed at [facility], [place] [address] [place], [place] [address] ?  ?CBC with Differential/Platelet     Status: Abnormal  ? Collection Time: 08/19/21 12:35 PM  ?Result Value Ref Range  ? WBC 17.1 (H) 4.0 - 10.5 K/uL  ? RBC 3.99 3.87 - 5.11 MIL/uL  ? Hemoglobin 11.6 (L) 12.0 - 15.0 g/dL  ? HCT 33.4 (L) 36.0 - 46.0 %  ? MCV 83.7 80.0 - 100.0  fL  ? MCH 29.1 26.0 - 34.0 pg  ? MCHC 34.7 30.0 - 36.0 g/dL  ? RDW 14.6 11.5 -  15.5 %  ? Platelets 291 150 - 400 K/uL  ? nRBC 0.0 0.0 - 0.2 %  ? Neutrophils Relative % 85 %  ? Neutro Abs 14.5 (H) 1.7 - 7.7 K/uL  ? Lymphocytes Relative 8 %  ? Lymphs Abs 1.4 0.7 - 4.0 K/uL  ? Monocytes Relative 7 %  ? Monocytes Absolute 1.1 (H) 0.1 - 1.0 K/uL  ? Eosinophils Relative 0 %  ? Eosinophils Absolute 0.0 0.0 - 0.5 K/uL  ? Basophils Relative 0 %  ? Basophils Absolute 0.0 0.0 - 0.1 K/uL  ? Immature Granulocytes 0 %  ? Abs Immature Granulocytes 0.04 0.00 - 0.07 K/uL  ?  ?MDM ?UA ?CBC with Diff ?CMP ?Type and Screen ? ?LR bolus- difficult IV stick, IV team consulted ? ?Dr. Nelda Marseille notified of patient arrival to MAU and complaint with results- will admit to [facility] for [facility] antibiotics ? ?Assessment and Plan  ? ?1. Pyelonephritis affecting pregnancy in second trimester   ?2. [redacted] weeks gestation of pregnancy   ? ?-Admit to [facility] ?-Care turned over to MD ? ?Wende Mott CNM ?08/19/2021, 11:50 AM  ?

## 2021-08-19 NOTE — MAU Note (Signed)
.  Alyssa Sandoval is a 21 y.o. at [redacted]w[redacted]d here in MAU reporting: sharp lower left sided back pain that is constant. It started on 5/3 and has increasingly got worse. Denies VB or abnormal discharge. Also having nausea and vomiting. Has not taken anything for the pain.  ? ?Pain score: 10 ?Vitals:  ? 08/19/21 1133  ?BP: 114/80  ?Pulse: (!) 117  ?Resp: 18  ?Temp: 99 ?F (37.2 ?C)  ?   ?FHT:164 ?Lab orders placed from triage:  UA ?

## 2021-08-20 LAB — CBC
HCT: 29.9 % — ABNORMAL LOW (ref 36.0–46.0)
Hemoglobin: 10.3 g/dL — ABNORMAL LOW (ref 12.0–15.0)
MCH: 29.6 pg (ref 26.0–34.0)
MCHC: 34.4 g/dL (ref 30.0–36.0)
MCV: 85.9 fL (ref 80.0–100.0)
Platelets: 241 10*3/uL (ref 150–400)
RBC: 3.48 MIL/uL — ABNORMAL LOW (ref 3.87–5.11)
RDW: 14.6 % (ref 11.5–15.5)
WBC: 13.2 10*3/uL — ABNORMAL HIGH (ref 4.0–10.5)
nRBC: 0 % (ref 0.0–0.2)

## 2021-08-20 NOTE — Progress Notes (Signed)
Patient ID: Alyssa Sandoval, female   DOB: 19-Apr-2000, 21 y.o.   MRN: [id number] ?ACULTY PRACTICE ANTEPARTUM COMPREHENSIVE PROGRESS NOTE ? ?Alyssa Sandoval is a 21 y.o. G2P0010 at [redacted]w[redacted]d  who is admitted for pyelonephritis.   ?Fetal presentation is unsure. ?Length of Stay:  1  Days ? ?Subjective: ?Pt reports feeling some better.  ? ?Vitals:  Blood pressure 99/62, pulse (!) 108, temperature 99.7 ?F (37.6 ?C), temperature source Oral, resp. rate 16, height 4\' 10"  (1.473 m), weight 58.6 kg, SpO2 100 %. ?Physical Examination: ?Lungs clear Heart RRR ?Abd soft + BS ?Back R CVA tenderness ?Ext non tender ? ? ? ?Labs:  ?Results for orders placed or performed during the hospital encounter of 08/19/21 (from the past 24 hour(s))  ?Urinalysis, Routine w reflex microscopic Urine, Clean Catch  ? Collection Time: 08/19/21 11:46 AM  ?Result Value Ref Range  ? Color, Urine AMBER (A) YELLOW  ? APPearance CLOUDY (A) CLEAR  ? Specific Gravity, Urine 1.014 1.005 - 1.030  ? pH 5.0 5.0 - 8.0  ? Glucose, UA NEGATIVE NEGATIVE mg/dL  ? Hgb urine dipstick NEGATIVE NEGATIVE  ? Bilirubin Urine NEGATIVE NEGATIVE  ? Ketones, ur 80 (A) NEGATIVE mg/dL  ? Protein, ur 30 (A) NEGATIVE mg/dL  ? Nitrite POSITIVE (A) NEGATIVE  ? Leukocytes,Ua LARGE (A) NEGATIVE  ? RBC / HPF 11-20 0 - 5 RBC/hpf  ? WBC, UA >50 (H) 0 - 5 WBC/hpf  ? Bacteria, UA MANY (A) NONE SEEN  ? Squamous Epithelial / LPF 6-10 0 - 5  ? WBC Clumps PRESENT   ? Mucus PRESENT   ?Type and screen [facility]  ? Collection Time: 08/19/21 12:34 PM  ?Result Value Ref Range  ? ABO/RH(D) AB POS   ? Antibody Screen NEG   ? Sample Expiration    ?  08/22/2021,2359 ?Performed at [facility], [address], [place], [place] ?  ?CBC with Differential/Platelet  ? Collection Time: 08/19/21 12:35 PM  ?Result Value Ref Range  ? WBC 17.1 (H) 4.0 - 10.5 K/uL  ? RBC 3.99 3.87 - 5.11 MIL/uL  ? Hemoglobin 11.6 (L) 12.0 - 15.0 g/dL  ? HCT 33.4 (L) 36.0 - 46.0 %  ? MCV 83.7 80.0 - 100.0 fL   ? MCH 29.1 26.0 - 34.0 pg  ? MCHC 34.7 30.0 - 36.0 g/dL  ? RDW 14.6 11.5 - 15.5 %  ? Platelets 291 150 - 400 K/uL  ? nRBC 0.0 0.0 - 0.2 %  ? Neutrophils Relative % 85 %  ? Neutro Abs 14.5 (H) 1.7 - 7.7 K/uL  ? Lymphocytes Relative 8 %  ? Lymphs Abs 1.4 0.7 - 4.0 K/uL  ? Monocytes Relative 7 %  ? Monocytes Absolute 1.1 (H) 0.1 - 1.0 K/uL  ? Eosinophils Relative 0 %  ? Eosinophils Absolute 0.0 0.0 - 0.5 K/uL  ? Basophils Relative 0 %  ? Basophils Absolute 0.0 0.0 - 0.1 K/uL  ? Immature Granulocytes 0 %  ? Abs Immature Granulocytes 0.04 0.00 - 0.07 K/uL  ?Comprehensive metabolic panel  ? Collection Time: 08/19/21 12:35 PM  ?Result Value Ref Range  ? Sodium 134 (L) 135 - 145 mmol/L  ? Potassium 3.2 (L) 3.5 - 5.1 mmol/L  ? Chloride 103 98 - 111 mmol/L  ? CO2 21 (L) 22 - 32 mmol/L  ? Glucose, Bld 93 70 - 99 mg/dL  ? BUN <5 (L) 6 - 20 mg/dL  ? Creatinine, Ser 0.57 0.44 -  1.00 mg/dL  ? Calcium 8.9 8.9 - 10.3 mg/dL  ? Total Protein 6.6 6.5 - 8.1 g/dL  ? Albumin 3.2 (L) 3.5 - 5.0 g/dL  ? AST 16 15 - 41 U/L  ? ALT 18 0 - 44 U/L  ? Alkaline Phosphatase 66 38 - 126 U/L  ? Total Bilirubin 0.4 0.3 - 1.2 mg/dL  ? GFR, Estimated >60 >60 mL/min  ? Anion gap 10 5 - 15  ?Lactic acid, plasma  ? Collection Time: 08/19/21  1:19 PM  ?Result Value Ref Range  ? Lactic Acid, Venous 0.7 0.5 - 1.9 mmol/L  ?CBC  ? Collection Time: 08/20/21  4:41 AM  ?Result Value Ref Range  ? WBC 13.2 (H) 4.0 - 10.5 K/uL  ? RBC 3.48 (L) 3.87 - 5.11 MIL/uL  ? Hemoglobin 10.3 (L) 12.0 - 15.0 g/dL  ? HCT 29.9 (L) 36.0 - 46.0 %  ? MCV 85.9 80.0 - 100.0 fL  ? MCH 29.6 26.0 - 34.0 pg  ? MCHC 34.4 30.0 - 36.0 g/dL  ? RDW 14.6 11.5 - 15.5 %  ? Platelets 241 150 - 400 K/uL  ? nRBC 0.0 0.0 - 0.2 %  ? ? ?Imaging Studies:    ?NA  ? ?Medications:  Scheduled ? aspirin EC  81 mg Oral Once  ? bictegravir-emtricitabine-tenofovir AF  1 tablet Oral Daily  ? docusate sodium  100 mg Oral Daily  ? prenatal multivitamin  1 tablet Oral Q1200  ? ?I have reviewed the patient's current  medications. ? ?ASSESSMENT: ?IUP 13 5/7 days ?Pyelo ?HIV ? ?PLAN: ?Stable. WBC improved. Remains afebrile.  ?Continue routine antenatal care. ? ? ?Hermina Staggers ?08/20/2021,7:43 AM ? ?  ?

## 2021-08-21 DIAGNOSIS — N12 Tubulo-interstitial nephritis, not specified as acute or chronic: Secondary | ICD-10-CM | POA: Diagnosis not present

## 2021-08-21 LAB — CBC WITH DIFFERENTIAL/PLATELET
Abs Immature Granulocytes: 0.04 10*3/uL (ref 0.00–0.07)
Basophils Absolute: 0 10*3/uL (ref 0.0–0.1)
Basophils Relative: 0 %
Eosinophils Absolute: 0.1 10*3/uL (ref 0.0–0.5)
Eosinophils Relative: 1 %
HCT: 27.4 % — ABNORMAL LOW (ref 36.0–46.0)
Hemoglobin: 9.5 g/dL — ABNORMAL LOW (ref 12.0–15.0)
Immature Granulocytes: 1 %
Lymphocytes Relative: 18 %
Lymphs Abs: 1.4 10*3/uL (ref 0.7–4.0)
MCH: 29.4 pg (ref 26.0–34.0)
MCHC: 34.7 g/dL (ref 30.0–36.0)
MCV: 84.8 fL (ref 80.0–100.0)
Monocytes Absolute: 0.5 10*3/uL (ref 0.1–1.0)
Monocytes Relative: 7 %
Neutro Abs: 5.5 10*3/uL (ref 1.7–7.7)
Neutrophils Relative %: 73 %
Platelets: 222 10*3/uL (ref 150–400)
RBC: 3.23 MIL/uL — ABNORMAL LOW (ref 3.87–5.11)
RDW: 14.7 % (ref 11.5–15.5)
WBC: 7.5 10*3/uL (ref 4.0–10.5)
nRBC: 0 % (ref 0.0–0.2)

## 2021-08-21 MED ORDER — CEPHALEXIN 500 MG PO CAPS: 500.0000 mg | ORAL_CAPSULE | Freq: Four times a day (QID) | ORAL | 0 refills | Status: DC

## 2021-08-21 NOTE — Discharge Summary (Signed)
Physician Discharge Summary  ?Patient ID: ?Alyssa Sandoval ?MRN: [id number] ?DOB/AGE: [birth year] 21 y.o. ? ?Admit date: 08/19/2021 ?Discharge date: 08/21/2021 ? ?Admission Diagnoses: ?Pyelonephritis ?Untreated UTI ? ?Discharge Diagnoses:  ?Principal Problem: ?  Pyelonephritis ? ? ?Discharged Condition: good ? ?Hospital Course: received 3 doses IV rocephin with resolution of her symptoms ?Discharged on oral Keflex x 10 days ?Sensitivities from positive culture was reviewed ?Consults: None ? ?Significant Diagnostic Studies: labs:  ? ?Treatments:IV rocephin ? ?Discharge Exam: ?Blood pressure (!) 88/59, pulse (!) 106, temperature 97.8 ?F (36.6 ?C), temperature source Oral, resp. rate 16, height 4\' 10"  (1.473 m), weight 58.6 kg, SpO2 100 %. ?General appearance: alert, cooperative, and no distress ?Back is bening no CVAT ? ?Disposition: Discharge disposition: 01-Home or Self Care ? ? ? ? ? ? ?Discharge Instructions   ? ? Call MD for:  persistant nausea and vomiting   Complete by: As directed ?  ? Call MD for:  severe uncontrolled pain   Complete by: As directed ?  ? Call MD for:  temperature >100.4   Complete by: As directed ?  ? Diet general   Complete by: As directed ?  ? Increase activity slowly   Complete by: As directed ?  ? ?  ? ?Allergies as of 08/21/2021   ?No Known Allergies ?  ? ?  ?Medication List  ?  ? ?TAKE these medications   ? ?aspirin EC 81 MG tablet ?Take 1 tablet (81 mg total) by mouth daily. Swallow whole. ?  ?Biktarvy 50-200-25 MG Tabs tablet ?Generic drug: bictegravir-emtricitabine-tenofovir AF ?Take 1 tablet by mouth daily. ?  ?cephALEXin 500 MG capsule ?Commonly known as: Keflex ?Take 1 capsule (500 mg total) by mouth 4 (four) times daily. ?  ?escitalopram 5 MG tablet ?Commonly known as: LEXAPRO ?Take 1 tablet (5 mg total) by mouth daily. ?  ?prenatal multivitamin Tabs tablet ?Take 1 tablet by mouth daily at 12 noon. ?  ?QUEtiapine 50 MG tablet ?Commonly known as: SEROquel ?Take 1 tablet (50 mg total) by  mouth at bedtime. ?  ? ?  ? ? Follow-up Information   ? ? [facility] up on 09/06/2021.   ?Why: already scheduled ?Contact information: ?[address] [place] SSN[place][id number] ?[phone] ? ?  ?  ? ?  ?  ? ?  ? ? ?Signed: ?[place] ?08/21/2021, 11:58 AM ? ? ?

## 2021-08-21 NOTE — Progress Notes (Signed)
Discharge papers and prescriptions given to pt. Discussed signs and symptoms to report to the MD, upcoming appointments, and meds. Pt verbalizes understanding and has no questions at this time. Pt discharged home from hospital in stable condition. ?

## 2021-08-23 ENCOUNTER — Other Ambulatory Visit: Payer: Self-pay | Admitting: Obstetrics & Gynecology

## 2021-08-23 ENCOUNTER — Telehealth: Payer: Self-pay | Admitting: *Deleted

## 2021-08-23 DIAGNOSIS — R110 Nausea: Secondary | ICD-10-CM

## 2021-08-23 MED ORDER — ONDANSETRON 4 MG PO TBDP: 4.0000 mg | ORAL_TABLET | Freq: Three times a day (TID) | ORAL | 1 refills | Status: DC | PRN

## 2021-08-23 NOTE — Telephone Encounter (Signed)
Pt has a headache and vomiting. Symptoms started when she left hospital; she was discharged Sunday.  Has a UTI. Pt vomits after she takes med for UTI. Tylenol is not helping headache. Pt don't have any nausea med. I spoke with Dr. Charlotta Newton. Pt was advised to continue taking Keflex. Nausea med will be sent to pharmacy. Take nausea med before taking Keflex. If this don't help, let us know. Pt voiced understanding. JSY ?

## 2021-08-23 NOTE — Progress Notes (Signed)
Rx sent in for zofran due to nausea from UTI treatment ?

## 2021-08-24 LAB — CULTURE, BLOOD (ROUTINE X 2)
Culture: NO GROWTH
Culture: NO GROWTH

## 2021-09-06 ENCOUNTER — Encounter: Payer: Medicaid Other | Admitting: Obstetrics & Gynecology

## 2021-09-11 ENCOUNTER — Ambulatory Visit (INDEPENDENT_AMBULATORY_CARE_PROVIDER_SITE_OTHER): Payer: Medicaid Other | Admitting: Women's Health

## 2021-09-11 ENCOUNTER — Encounter: Payer: Self-pay | Admitting: Women's Health

## 2021-09-11 VITALS — BP 106/69 | HR 90 | Wt 134.0 lb

## 2021-09-11 DIAGNOSIS — Z1379 Encounter for other screening for genetic and chromosomal anomalies: Secondary | ICD-10-CM

## 2021-09-11 DIAGNOSIS — Z363 Encounter for antenatal screening for malformations: Secondary | ICD-10-CM

## 2021-09-11 DIAGNOSIS — N12 Tubulo-interstitial nephritis, not specified as acute or chronic: Secondary | ICD-10-CM

## 2021-09-11 DIAGNOSIS — B20 Human immunodeficiency virus [HIV] disease: Secondary | ICD-10-CM

## 2021-09-11 DIAGNOSIS — Z87440 Personal history of urinary (tract) infections: Secondary | ICD-10-CM

## 2021-09-11 DIAGNOSIS — O0992 Supervision of high risk pregnancy, unspecified, second trimester: Secondary | ICD-10-CM

## 2021-09-11 NOTE — Progress Notes (Signed)
HIGH-RISK PREGNANCY VISIT Patient name: Alyssa Sandoval MRN [id number]  Date of birth: [birth year] Chief Complaint:   Routine Prenatal Visit  History of Present Illness:   Alyssa Sandoval is a 21 y.o. G35P0010 female at [redacted]w[redacted]d with an Estimated Date of Delivery: 02/20/22 being seen today for ongoing management of a high-risk pregnancy complicated by HIV on Biktarvy.    Today she reports no complaints. Hosptilized 5/13-5/15 for pyelo, no sx now. Hasn't started ASA. Contractions: Not present. Vag. Bleeding: None.  Movement: Present. denies leaking of fluid.      08/09/2021    3:55 PM 05/02/2021    8:32 AM 03/15/2021    9:39 AM 10/19/2020    2:11 PM 05/24/2017   10:38 AM  Depression screen PHQ 2/9  Decreased Interest 1 0 0 3 2  Down, Depressed, Hopeless 0 0 1 2 0  PHQ - 2 Score 1 0 1 5 2   Altered sleeping 1   3 3   Tired, decreased energy 1   3 3   Change in appetite 1   2 3   Feeling bad or failure about yourself  0   2 0  Trouble concentrating 0   0 0  Moving slowly or fidgety/restless 0   2 0  Suicidal thoughts 0   0 0  PHQ-9 Score 4   17 11   Difficult doing work/chores     Somewhat difficult        08/09/2021    3:56 PM 10/19/2020    2:11 PM  GAD 7 : Generalized Anxiety Score  Nervous, Anxious, on Edge 1 3  Control/stop worrying 0 3  Worry too much - different things 1 3  Trouble relaxing 0 3  Restless 0 0  Easily annoyed or irritable 0 3  Afraid - awful might happen 0 3  Total GAD 7 Score 2 18     Review of Systems:   Pertinent items are noted in HPI Denies abnormal vaginal discharge w/ itching/odor/irritation, headaches, visual changes, shortness of breath, chest pain, abdominal pain, severe nausea/vomiting, or problems with urination or bowel movements unless otherwise stated above. Pertinent History Reviewed:  Reviewed past medical,surgical, social, obstetrical and family history.  Reviewed problem list, medications and allergies. Physical Assessment:   Vitals:   09/11/21  1143  BP: 106/69  Pulse: 90  Weight: 134 lb (60.8 kg)  Body mass index is 28.01 kg/m.           Physical Examination:   General appearance: alert, well appearing, and in no distress  Mental status: alert, oriented to person, place, and time  Skin: warm & dry   Extremities: Edema: None    Cardiovascular: normal heart rate noted  Respiratory: normal respiratory effort, no distress  Abdomen: gravid, soft, non-tender  Pelvic: Cervical exam deferred         Fetal Status: Fetal Heart Rate (bpm): 147   Movement: Present    Fetal Surveillance Testing today: doppler   Chaperone: N/A    No results found for this or any previous visit (from the past 24 hour(s)).  Assessment & Plan:  High-risk pregnancy: G2P0010 at [redacted]w[redacted]d with an Estimated Date of Delivery: 02/20/22   1) HIV, on Biktarvy, last viral load 12,000 (07/13/21), has appt w/ ID on 6/9  2) Recent pyelo w/ hospitalization, urine poc today  Meds: No orders of the defined types were placed in this encounter.  Labs/procedures today:  2nd IT- does not want to go to our [facility], they stuck her  3 x's at new ob visit, will take papers to ID visit on 6/9  Treatment Plan:  EFW @ 28, 36wks     No testing      C/S @ 38wks (VL >1K) or IOL @ 39wks (VL <1K)  Reviewed: Preterm labor symptoms and general obstetric precautions including but not limited to vaginal bleeding, contractions, leaking of fluid and fetal movement were reviewed in detail with the patient.  All questions were answered. Does have home bp cuff. Office bp cuff given: not applicable. Check bp weekly, let us know if consistently >140 and/or >90.  Follow-up: Return in about 3 weeks (around 10/02/2021) for HROB, GY:JEHUDJS, MD or CNM, in person.   Future Appointments  Date Time Provider Department Center  09/15/2021  2:30 PM Daiva Eves, Lisette Grinder, MD [facility]  10/16/2021  1:30 PM [facility] [place] [facility] None  10/16/2021  2:30 PM Cheral Marker, CNM [facility]     Orders Placed This Encounter  Procedures   Urine Culture   US OB Comp + 14 Wk   INTEGRATED 2   Cheral Marker [place], [facility] 09/11/2021 12:33 PM

## 2021-09-11 NOTE — Patient Instructions (Signed)
Darral Dash, thank you for choosing our office today! We appreciate the opportunity to meet your healthcare needs. You may receive a short survey by mail, e-mail, or through [employer]. If you are happy with your care we would appreciate if you could take just a few minutes to complete the survey questions. We read all of your comments and take your feedback very seriously. Thank you again for choosing our office.  Center for [employer] Team at [facility] at [facility] ([address] [place], [place] [address]) Entrance C, located off of [address] Free 24/7 valet parking  Go to [email or web address] to register for FREE online childbirth classes  Call the office [phone]) or go to [facility] if: You begin to severe cramping Your water breaks.  Sometimes it is a big gush of fluid, sometimes it is just a trickle that keeps getting your panties wet or running down your legs You have vaginal bleeding.  It is normal to have a small amount of spotting if your cervix was checked.   [facility] Pediatricians/Family Doctors [facility]): [address] Colette Ribas, [phone]           [facility]: [address], [phone]                [facility]): [address], [phone] (call to ask if accepting patients) [facility]: [address], [employer], [phone]    [facility] Pediatricians/Family Doctors [facility]): [phone] [address], [phone] [facility]: [address] [place], [phone] [facility]: [address], [phone]  [facility] Doctors  [facility]): [phone] [facility]: [address], [phone]   [facility] Doctors [facility]: [address] [phone]  [facility] Doctors  [facility]: [phone], [phone]  Home Blood Pressure Monitoring for Patients   Your provider has recommended that you check your blood pressure (BP) at least once a week at home. If you do not have a blood pressure cuff at home, one will be provided for you. Contact your provider if you have not received your monitor within 1 week.   Helpful Tips for Accurate Home Blood Pressure Checks  Don't smoke, exercise, or drink caffeine 30 minutes before checking your BP Use the restroom before checking your BP (a full bladder can raise your pressure) Relax in a comfortable upright chair Feet on the ground Left arm resting comfortably on a flat surface at the level of your heart Legs uncrossed Back supported Sit quietly and don't talk Place the cuff on your bare arm Adjust snuggly, so that only two fingertips can fit between your skin and the top of the cuff Check 2 readings separated by at least one minute Keep a log of your BP readings For a visual, please reference this diagram: http://ccnc.care/bpdiagram  Provider Name: [facility]     Phone: [phone]  Zone 1: ALL CLEAR  Continue to monitor your symptoms:  BP reading is less than 140 (top number) or less than 90 (bottom number)  No right upper stomach pain No headaches or seeing spots No feeling nauseated or throwing up No swelling in face and hands  Zone 2: CAUTION Call your doctor's office for any of the following:  BP reading is greater than 140 (top number) or greater than  90 (bottom number)  Stomach pain under your ribs in the middle or right side Headaches or seeing spots Feeling nauseated or throwing up Swelling in face and hands  Zone 3: EMERGENCY  Seek immediate medical care if you have any of the following:  BP reading is greater than160 (top number) or greater than 110 (bottom number) Severe headaches not improving with Tylenol Serious difficulty catching your breath Any worsening symptoms from  Zone 2     Second Trimester of Pregnancy The second trimester is from week 14 through week 27 (months 4 through 6). The second trimester is often a time when you feel your best. Your body has adjusted to being pregnant, and you begin to feel better physically. Usually, morning sickness has lessened or quit completely, you may have more energy, and you may have an increase in appetite. The second trimester is also a time when the fetus is growing rapidly. At the end of the sixth month, the fetus is about 9 inches long and weighs about 1 pounds. You will likely begin to feel the baby move (quickening) between 16 and 20 weeks of pregnancy. Body changes during your second trimester Your body continues to go through many changes during your second trimester. The changes vary from woman to woman. Your weight will continue to increase. You will notice your lower abdomen bulging out. You may begin to get stretch marks on your hips, abdomen, and breasts. You may develop headaches that can be relieved by medicines. The medicines should be approved by your health care provider. You may urinate more often because the fetus is pressing on your bladder. You may develop or continue to have heartburn as a result of your pregnancy. You may develop constipation because certain hormones are causing the muscles that push waste through your intestines to slow down. You may develop hemorrhoids or swollen, bulging veins (varicose veins). You may have back pain. This is caused by: Weight gain. Pregnancy hormones that are relaxing the joints in your pelvis. A shift in weight and the muscles that support your balance. Your breasts will continue to grow and they will continue to become tender. Your gums may bleed and may be sensitive to brushing and flossing. Dark spots or blotches (chloasma, mask of pregnancy) may develop on your face. This will likely fade after the baby is born. A dark line from your belly button to  the pubic area (linea nigra) may appear. This will likely fade after the baby is born. You may have changes in your hair. These can include thickening of your hair, rapid growth, and changes in texture. Some women also have hair loss during or after pregnancy, or hair that feels dry or thin. Your hair will most likely return to normal after your baby is born.  What to expect at prenatal visits During a routine prenatal visit: You will be weighed to make sure you and the fetus are growing normally. Your blood pressure will be taken. Your abdomen will be measured to track your baby's growth. The fetal heartbeat will be listened to. Any test results from the previous visit will be discussed.  Your health care provider may ask you: How you are feeling. If you are feeling the baby move. If you have had any abnormal symptoms, such as leaking fluid, bleeding, severe headaches, or abdominal cramping. If you are using any tobacco products, including cigarettes, chewing tobacco, and electronic cigarettes. If you have any questions.  Other tests that may be performed during   your second trimester include: Blood tests that check for: Low iron levels (anemia). High blood sugar that affects pregnant women (gestational diabetes) between 24 and 28 weeks. Rh antibodies. This is to check for a protein on red blood cells (Rh factor). Urine tests to check for infections, diabetes, or protein in the urine. An ultrasound to confirm the proper growth and development of the baby. An amniocentesis to check for possible genetic problems. Fetal screens for spina bifida and Down syndrome. HIV (human immunodeficiency virus) testing. Routine prenatal testing includes screening for HIV, unless you choose not to have this test.  Follow these instructions at home: Medicines Follow your health care provider's instructions regarding medicine use. Specific medicines may be either safe or unsafe to take during  pregnancy. Take a prenatal vitamin that contains at least 600 micrograms (mcg) of folic acid. If you develop constipation, try taking a stool softener if your health care provider approves. Eating and drinking Eat a balanced diet that includes fresh fruits and vegetables, whole grains, good sources of protein such as meat, eggs, or tofu, and low-fat dairy. Your health care provider will help you determine the amount of weight gain that is right for you. Avoid raw meat and uncooked cheese. These carry germs that can cause birth defects in the baby. If you have low calcium intake from food, talk to your health care provider about whether you should take a daily calcium supplement. Limit foods that are high in fat and processed sugars, such as fried and sweet foods. To prevent constipation: Drink enough fluid to keep your urine clear or pale yellow. Eat foods that are high in fiber, such as fresh fruits and vegetables, whole grains, and beans. Activity Exercise only as directed by your health care provider. Most women can continue their usual exercise routine during pregnancy. Try to exercise for 30 minutes at least 5 days a week. Stop exercising if you experience uterine contractions. Avoid heavy lifting, wear low heel shoes, and practice good posture. A sexual relationship may be continued unless your health care provider directs you otherwise. Relieving pain and discomfort Wear a good support bra to prevent discomfort from breast tenderness. Take warm sitz baths to soothe any pain or discomfort caused by hemorrhoids. Use hemorrhoid cream if your health care provider approves. Rest with your legs elevated if you have leg cramps or low back pain. If you develop varicose veins, wear support hose. Elevate your feet for 15 minutes, 3-4 times a day. Limit salt in your diet. Prenatal Care Write down your questions. Take them to your prenatal visits. Keep all your prenatal visits as told by your health  care provider. This is important. Safety Wear your seat belt at all times when driving. Make a list of emergency phone numbers, including numbers for family, friends, the hospital, and police and fire departments. General instructions Ask your health care provider for a referral to a local prenatal education class. Begin classes no later than the beginning of month 6 of your pregnancy. Ask for help if you have counseling or nutritional needs during pregnancy. Your health care provider can offer advice or refer you to specialists for help with various needs. Do not use hot tubs, steam rooms, or saunas. Do not douche or use tampons or scented sanitary pads. Do not cross your legs for long periods of time. Avoid cat litter boxes and soil used by cats. These carry germs that can cause birth defects in the baby and possibly loss of the   fetus by miscarriage or stillbirth. Avoid all smoking, herbs, alcohol, and unprescribed drugs. Chemicals in these products can affect the formation and growth of the baby. Do not use any products that contain nicotine or tobacco, such as cigarettes and e-cigarettes. If you need help quitting, ask your health care provider. Visit your dentist if you have not gone yet during your pregnancy. Use a soft toothbrush to brush your teeth and be gentle when you floss. Contact a health care provider if: You have dizziness. You have mild pelvic cramps, pelvic pressure, or nagging pain in the abdominal area. You have persistent nausea, vomiting, or diarrhea. You have a bad smelling vaginal discharge. You have pain when you urinate. Get help right away if: You have a fever. You are leaking fluid from your vagina. You have spotting or bleeding from your vagina. You have severe abdominal cramping or pain. You have rapid weight gain or weight loss. You have shortness of breath with chest pain. You notice sudden or extreme swelling of your face, hands, ankles, feet, or legs. You  have not felt your baby move in over an hour. You have severe headaches that do not go away when you take medicine. You have vision changes. Summary The second trimester is from week 14 through week 27 (months 4 through 6). It is also a time when the fetus is growing rapidly. Your body goes through many changes during pregnancy. The changes vary from woman to woman. Avoid all smoking, herbs, alcohol, and unprescribed drugs. These chemicals affect the formation and growth your baby. Do not use any tobacco products, such as cigarettes, chewing tobacco, and e-cigarettes. If you need help quitting, ask your health care provider. Contact your health care provider if you have any questions. Keep all prenatal visits as told by your health care provider. This is important. This information is not intended to replace advice given to you by your health care provider. Make sure you discuss any questions you have with your health care provider. Document Released: 03/20/2001 Document Revised: 09/01/2015 Document Reviewed: 05/27/2012 Elsevier Interactive Patient Education  2017 Elsevier Inc.  

## 2021-09-13 ENCOUNTER — Ambulatory Visit: Payer: Self-pay | Admitting: Infectious Disease

## 2021-09-13 LAB — URINE CULTURE

## 2021-09-15 ENCOUNTER — Ambulatory Visit: Payer: Self-pay | Admitting: Infectious Disease

## 2021-09-18 ENCOUNTER — Encounter: Payer: Self-pay | Admitting: Family

## 2021-09-18 ENCOUNTER — Ambulatory Visit (INDEPENDENT_AMBULATORY_CARE_PROVIDER_SITE_OTHER): Payer: Medicaid Other | Admitting: Family

## 2021-09-18 ENCOUNTER — Other Ambulatory Visit: Payer: Self-pay

## 2021-09-18 ENCOUNTER — Other Ambulatory Visit (HOSPITAL_COMMUNITY): Payer: Self-pay

## 2021-09-18 VITALS — BP 106/70 | HR 94 | Temp 98.7°F | Wt 136.0 lb

## 2021-09-18 DIAGNOSIS — O0991 Supervision of high risk pregnancy, unspecified, first trimester: Secondary | ICD-10-CM

## 2021-09-18 DIAGNOSIS — B20 Human immunodeficiency virus [HIV] disease: Secondary | ICD-10-CM

## 2021-09-18 NOTE — Progress Notes (Signed)
Brief Narrative   Patient ID: Alyssa Sandoval, female    DOB: 04/18/2000, 21 y.o.   MRN: [id number]  [name] is a 21 y/o AA female diagnosed with HIV disease with risk factor of heterosexual contact. Initial CD4 count count of 566 with viral load of 3,460. Genotype with no significant medication resistance mutations. No history of opportunistic infection. [id number] negative. Sole ART medication of Biktarvy. [facility]   Subjective:    Chief Complaint  Patient presents with   Follow-up    HPI:  Alyssa Sandoval is a 21 y.o. female with HIV disease last seen on 07/13/2021 by Dr. Daiva EvesVan Dam and was newly pregnant. Change of medication was recommended to Tivicay and Descovy and after discussion she continued on Biktarvy. Viral load was 12,000 with CD4 count of 784. Has been seen by OB in the interim. Here today for 2 month follow up.  [name] has less than optimal adherence to both her prenatal vitamin and Biktravy missing about 2-3 doses per week. Forgets to take them and finds spacing them out to be a challenge. Has never been good about taking medications. Does not currently like the grapefruit flavor of her prenatal vitamin but is trying to finish the bottle before purchasing more having missed at least a week of supplementation thus far. Has concern she will not be able to afford her Biktarvy so has not picked up a refill. Denies fevers, chills, night sweats, headaches, changes in vision, neck pain/stiffness, nausea, diarrhea, vomiting, lesions or rashes.  [name] remains covered by ICAP and possibly Medicaid. Denies feelings of being down, depressed or hopeless recently. No current recreational or illicit drug use, tobacco use or alcohol consumption.   No Known Allergies    Outpatient Medications Prior to Visit  Medication Sig Dispense Refill   bictegravir-emtricitabine-tenofovir AF (BIKTARVY) 50-200-25 MG TABS tablet Take 1 tablet by mouth daily. 30 tablet 11    ondansetron (ZOFRAN-ODT) 4 MG disintegrating tablet Take 1 tablet (4 mg total) by mouth every 8 (eight) hours as needed for nausea or vomiting. 20 tablet 1   Prenatal Vit-Fe Fumarate-FA (PRENATAL MULTIVITAMIN) TABS tablet Take 1 tablet by mouth daily at 12 noon.     aspirin EC 81 MG tablet Take 1 tablet (81 mg total) by mouth daily. Swallow whole. (Patient not taking: Reported on 09/11/2021) 30 tablet 11   cephALEXin (KEFLEX) 500 MG capsule Take 1 capsule (500 mg total) by mouth 4 (four) times daily. (Patient not taking: Reported on 09/11/2021) 40 capsule 0   escitalopram (LEXAPRO) 5 MG tablet Take 1 tablet (5 mg total) by mouth daily. (Patient not taking: Reported on 07/04/2021) 30 tablet 4   QUEtiapine (SEROQUEL) 50 MG tablet Take 1 tablet (50 mg total) by mouth at bedtime. (Patient not taking: Reported on 07/04/2021) 90 tablet 3   No facility-administered medications prior to visit.     Past Medical History:  Diagnosis Date   Chronic constipation 06/10/2012   COVID-19 04/2020   Dysfunctional uterine bleeding    GERD (gastroesophageal reflux disease) 06/10/2012   Headache 12/09/2020   High-risk pregnancy 07/13/2021   HIV (human immunodeficiency virus infection) (HCC)    Low back pain 12/09/2020   Passive suicidal ideations 11/07/2020   Vaginal pruritus 03/15/2021   Vaginal yeast infection      Past Surgical History:  Procedure Laterality Date   NO PAST SURGERIES        Review of Systems  Constitutional:  Negative  for appetite change, chills, diaphoresis, fatigue, fever and unexpected weight change.  Eyes:        Negative for acute change in vision  Respiratory:  Negative for chest tightness, shortness of breath and wheezing.   Cardiovascular:  Negative for chest pain.  Gastrointestinal:  Negative for diarrhea, nausea and vomiting.  Genitourinary:  Negative for dysuria, pelvic pain and vaginal discharge.  Musculoskeletal:  Negative for neck pain and neck stiffness.  Skin:   Negative for rash.  Neurological:  Negative for seizures, syncope, weakness and headaches.  Hematological:  Negative for adenopathy. Does not bruise/bleed easily.  Psychiatric/Behavioral:  Negative for hallucinations.       Objective:    BP 106/70   Pulse 94   Temp 98.7 F (37.1 C) (Oral)   Wt 136 lb (61.7 kg)   LMP  (LMP Unknown) Comment: a month ago.  + home pregnancy test on 3/24  BMI 28.42 kg/m  Nursing note and vital signs reviewed.  Physical Exam Constitutional:      General: She is not in acute distress.    Appearance: She is well-developed.  Eyes:     Conjunctiva/sclera: Conjunctivae normal.  Cardiovascular:     Rate and Rhythm: Normal rate and regular rhythm.     Heart sounds: Normal heart sounds. No murmur heard.    No friction rub. No gallop.  Pulmonary:     Effort: Pulmonary effort is normal. No respiratory distress.     Breath sounds: Normal breath sounds. No wheezing or rales.  Chest:     Chest wall: No tenderness.  Abdominal:     General: Bowel sounds are normal.     Palpations: Abdomen is soft.     Tenderness: There is no abdominal tenderness.  Musculoskeletal:     Cervical back: Neck supple.  Lymphadenopathy:     Cervical: No cervical adenopathy.  Skin:    General: Skin is warm and dry.     Findings: No rash.  Neurological:     Mental Status: She is alert and oriented to person, place, and time.  Psychiatric:        Behavior: Behavior normal.        Thought Content: Thought content normal.        Judgment: Judgment normal.         09/18/2021    1:53 PM 08/09/2021    3:55 PM 05/02/2021    8:32 AM 03/15/2021    9:39 AM 10/19/2020    2:11 PM  Depression screen PHQ 2/9  Decreased Interest 0 1 0 0 3  Down, Depressed, Hopeless 0 0 0 1 2  PHQ - 2 Score 0 1 0 1 5  Altered sleeping  1   3  Tired, decreased energy  1   3  Change in appetite  1   2  Feeling bad or failure about yourself   0   2  Trouble concentrating  0   0  Moving slowly or  fidgety/restless  0   2  Suicidal thoughts  0   0  PHQ-9 Score  4   17       Assessment & Plan:    Patient Active Problem List   Diagnosis Date Noted   Pyelonephritis 08/19/2021   HSV (herpes simplex virus) anogenital infection 08/09/2021   High-risk pregnancy in first trimester 08/08/2021   Headache 12/09/2020   Low back pain 12/09/2020   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   Passive suicidal ideations 11/07/2020   HIV  disease (HCC) 11/01/2020   RLQ abdominal pain 10/21/2020   Irregular menses 10/21/2020     Problem List Items Addressed This Visit       Other   HIV disease (HCC) - Primary    Ms. Wells has less than optimal adherence with good tolerance to her ART regimen of Biktarvy.  We reviewed previous lab work and discussed importance of taking medications daily as prescribed.  Offered alternative methods of taking medication including taking her Biktarvy in the morning and vitamins at night or vice versa.  Her [name] is free and should not cost her anything so she should have no problems obtaining medication from the pharmacy.  Discussed continued health risks in the future and to prevent risk of progression and/or complications in the future it is important to take medication daily.  Check blood work today.  Continue current dose of Biktarvy.  Plan for follow-up in 1 month or sooner if needed.      Relevant Orders   HIV RNA, RTPCR W/R GT (RTI, PI,INT)   T-helper cell (CD4)- ([facility] clinic only)   High-risk pregnancy in first trimester    Ms. Wells continues to have high risk pregnancy with poorly controlled HIV disease and less than optimal adherence to her prenatal vitamins.  Discussed importance of taking medications daily as prescribed.  Has follow-up with OB and ultrasound scheduled.        I am having Alyssa Laine maintain her QUEtiapine, escitalopram, prenatal multivitamin, Biktarvy, aspirin EC, cephALEXin, and ondansetron.   Follow-up: Return in about 1 month (around  10/18/2021), or if symptoms worsen or fail to improve.   Marcos Eke, MSN, FNP-C Nurse Practitioner [facility] for Infectious Disease [facility] Main number: [phone]

## 2021-09-18 NOTE — Patient Instructions (Signed)
Nice to see you.  We will check your lab work today.  Continue to take your multivitamin and medication daily.  Take 1 in the morning and 1 in the evening.  Goal is <1 missed dose per week.  Plan for follow up in 1 month or sooner if needed.

## 2021-09-18 NOTE — Assessment & Plan Note (Signed)
[name] continues to have high risk pregnancy with poorly controlled HIV disease and less than optimal adherence to her prenatal vitamins.  Discussed importance of taking medications daily as prescribed.  Has follow-up with OB and ultrasound scheduled.

## 2021-09-18 NOTE — Assessment & Plan Note (Signed)
[name] has less than optimal adherence with good tolerance to her ART regimen of Biktarvy.  We reviewed previous lab work and discussed importance of taking medications daily as prescribed.  Offered alternative methods of taking medication including taking her Biktarvy in the morning and vitamins at night or vice versa.  Her Alyssa Sandoval is free and should not cost her anything so she should have no problems obtaining medication from the pharmacy.  Discussed continued health risks in the future and to prevent risk of progression and/or complications in the future it is important to take medication daily.  Check blood work today.  Continue current dose of Biktarvy.  Plan for follow-up in 1 month or sooner if needed.

## 2021-09-20 LAB — T-HELPER CELL (CD4) - (RCID CLINIC ONLY)
CD4 % Helper T Cell: 42 % (ref 33–65)
CD4 T Cell Abs: 819 /uL (ref 400–1790)

## 2021-10-05 LAB — HIV-1 INTEGRASE GENOTYPE

## 2021-10-05 LAB — HIV RNA, RTPCR W/R GT (RTI, PI,INT)
HIV 1 RNA Quant: 911 copies/mL — ABNORMAL HIGH
HIV-1 RNA Quant, Log: 2.96 Log copies/mL — ABNORMAL HIGH

## 2021-10-05 LAB — HIV-1 GENOTYPE: HIV-1 Genotype: DETECTED — AB

## 2021-10-12 ENCOUNTER — Telehealth: Payer: Self-pay

## 2021-10-16 ENCOUNTER — Ambulatory Visit (INDEPENDENT_AMBULATORY_CARE_PROVIDER_SITE_OTHER): Payer: Medicaid Other | Admitting: Women's Health

## 2021-10-16 ENCOUNTER — Ambulatory Visit (INDEPENDENT_AMBULATORY_CARE_PROVIDER_SITE_OTHER): Payer: Medicaid Other

## 2021-10-16 ENCOUNTER — Encounter: Payer: Self-pay | Admitting: Women's Health

## 2021-10-16 VITALS — BP 111/74 | HR 89 | Wt 144.0 lb

## 2021-10-16 DIAGNOSIS — Z363 Encounter for antenatal screening for malformations: Secondary | ICD-10-CM

## 2021-10-16 DIAGNOSIS — O0991 Supervision of high risk pregnancy, unspecified, first trimester: Secondary | ICD-10-CM

## 2021-10-16 DIAGNOSIS — O0992 Supervision of high risk pregnancy, unspecified, second trimester: Secondary | ICD-10-CM

## 2021-10-16 DIAGNOSIS — Z3A21 21 weeks gestation of pregnancy: Secondary | ICD-10-CM

## 2021-10-16 DIAGNOSIS — B20 Human immunodeficiency virus [HIV] disease: Secondary | ICD-10-CM | POA: Diagnosis not present

## 2021-10-16 NOTE — Progress Notes (Signed)
HIGH-RISK PREGNANCY VISIT Patient name: Alyssa Sandoval MRN [id number]  Date of birth: 2000/12/26 Chief Complaint:   Routine Prenatal Visit (Anatomy scan/)  History of Present Illness:   Alyssa Sandoval is a 21 y.o. G2P0010 female at [redacted]w[redacted]d with an Estimated Date of Delivery: 02/20/22 being seen today for ongoing management of a high-risk pregnancy complicated by HIV on Biktarvy.    Today she reports  doing better w/ taking Biktarvy now that she takes it at night . Did not do 2nd IT at ID appt as discussed last visit b/c was wrong lab per pt. Declines today.  Contractions: Not present. Vag. Bleeding: None.  Movement: Present. denies leaking of fluid.      09/18/2021    1:53 PM 08/09/2021    3:55 PM 05/02/2021    8:32 AM 03/15/2021    9:39 AM 10/19/2020    2:11 PM  Depression screen PHQ 2/9  Decreased Interest 0 1 0 0 3  Down, Depressed, Hopeless 0 0 0 1 2  PHQ - 2 Score 0 1 0 1 5  Altered sleeping  1   3  Tired, decreased energy  1   3  Change in appetite  1   2  Feeling bad or failure about yourself   0   2  Trouble concentrating  0   0  Moving slowly or fidgety/restless  0   2  Suicidal thoughts  0   0  PHQ-9 Score  4   17        08/09/2021    3:56 PM 10/19/2020    2:11 PM  GAD 7 : Generalized Anxiety Score  Nervous, Anxious, on Edge 1 3  Control/stop worrying 0 3  Worry too much - different things 1 3  Trouble relaxing 0 3  Restless 0 0  Easily annoyed or irritable 0 3  Afraid - awful might happen 0 3  Total GAD 7 Score 2 18     Review of Systems:   Pertinent items are noted in HPI Denies abnormal vaginal discharge w/ itching/odor/irritation, headaches, visual changes, shortness of breath, chest pain, abdominal pain, severe nausea/vomiting, or problems with urination or bowel movements unless otherwise stated above. Pertinent History Reviewed:  Reviewed past medical,surgical, social, obstetrical and family history.  Reviewed problem list, medications and allergies. Physical  Assessment:   Vitals:   10/16/21 1409  BP: 111/74  Pulse: 89  Weight: 144 lb (65.3 kg)  Body mass index is 30.1 kg/m.           Physical Examination:   General appearance: alert, well appearing, and in no distress  Mental status: alert, oriented to person, place, and time  Skin: warm & dry   Extremities: Edema: None    Cardiovascular: normal heart rate noted  Respiratory: normal respiratory effort, no distress  Abdomen: gravid, soft, non-tender  Pelvic: Cervical exam deferred         Fetal Status: Fetal Heart Rate (bpm): 144 u/s   Movement: Present    Fetal Surveillance Testing today: [place] 21+6 wks,cephalic,anterior placenta gr 0,normal ovaries,CX 3.1 cm,SVP of fluid 6.1 cm,FHR 144 bpm,2 LVEICF,1 RVEICF,EFW 500 g 71%,anatomy complete  Chaperone: N/A    No results found for this or any previous visit (from the past 24 hour(s)).  Assessment & Plan:  High-risk pregnancy: G2P0010 at [redacted]w[redacted]d with an Estimated Date of Delivery: 02/20/22   1) HIV+, on Biktarvy, doing better w/ taking at night, last VL 911 (6/12), has appt w/ ID  7/12  2) Fetal LVEICF x 2, RVEICF, normal NIPS, discussed and gave printed info, will have repeat u/s 28wks d/t +HIV  Meds: No orders of the defined types were placed in this encounter.   Labs/procedures today: U/S  Treatment Plan:  U/S 28, 36wks     No testing      C/S @ 38wks (VL >1K) or IOL @ >=39wks (VL <1K)   Reviewed: Preterm labor symptoms and general obstetric precautions including but not limited to vaginal bleeding, contractions, leaking of fluid and fetal movement were reviewed in detail with the patient.  All questions were answered. Does have home bp cuff. Office bp cuff given: not applicable. Check bp weekly, let us know if consistently >140 and/or >90.  Follow-up: Return in about 3 weeks (around 11/06/2021) for HROB, MD, in person; then 6wks from now for efw u/s, pn2, hrob w/ md.   Future Appointments  Date Time Provider Department Center   10/18/2021  2:15 PM Veryl Speak, FNP [facility]  11/06/2021  3:10 PM Lazaro Arms, MD [facility]  11/28/2021  8:30 AM [facility] LAB [facility]  11/28/2021  9:00 AM [facility] [place] [facility] None  11/28/2021 10:30 AM Myna Hidalgo, DO [facility]    No orders of the defined types were placed in this encounter.  Cheral Marker CNM, [facility] 10/16/2021 2:37 PM

## 2021-10-16 NOTE — Patient Instructions (Addendum)
Alyssa Sandoval, thank you for choosing our office today! We appreciate the opportunity to meet your healthcare needs. You may receive a short survey by mail, e-mail, or through [employer]. If you are happy with your care we would appreciate if you could take just a few minutes to complete the survey questions. We read all of your comments and take your feedback very seriously. Thank you again for choosing our office.  Center for [employer] Team at [facility] at [facility] ([address] [place], [place] [address]) Entrance C, located off of [address] Free 24/7 valet parking  Go to [email or web address] to register for FREE online childbirth classes  Call the office [phone]) or go to [facility] if: You begin to severe cramping Your water breaks.  Sometimes it is a big gush of fluid, sometimes it is just a trickle that keeps getting your panties wet or running down your legs You have vaginal bleeding.  It is normal to have a small amount of spotting if your cervix was checked.   [facility] Pediatricians/Family Doctors [facility]): [address] Colette Ribas, [phone]           [facility]: [address], [phone]                [facility]): [address], [phone] (call to ask if accepting patients) [facility] Department: [address], [employer], [phone]    [facility] Pediatricians/Family Doctors [facility]): [phone] [address], [phone] [facility]: [address] [place], [phone] [facility]: [address], [phone]  [facility] Doctors  [facility]): [phone] [facility]: [address], [phone]   [facility] Doctors [facility]: [address] [phone]  [facility] Doctors  [facility]: [phone], [phone]  Home Blood Pressure Monitoring for Patients   Your provider has recommended that you check your blood pressure (BP) at least once a week at home. If you do not have a blood pressure cuff at home, one will be provided for you. Contact your provider if you have not received your monitor within 1 week.   Helpful Tips for Accurate Home Blood Pressure Checks  Don't smoke, exercise, or drink caffeine 30 minutes before checking your BP Use the restroom before checking your BP (a full bladder can raise your pressure) Relax in a comfortable upright chair Feet on the ground Left arm resting comfortably on a flat surface at the level of your heart Legs uncrossed Back supported Sit quietly and don't talk Place the cuff on your bare arm Adjust snuggly, so that only two fingertips can fit between your skin and the top of the cuff Check 2 readings separated by at least one minute Keep a log of your BP readings For a visual, please reference this diagram: http://ccnc.care/bpdiagram  Provider Name: [facility]     Phone: [phone]  Zone 1: ALL CLEAR  Continue to monitor your symptoms:  BP reading is less than 140 (top number) or less than 90 (bottom number)  No right upper stomach pain No headaches or seeing spots No feeling nauseated or throwing up No swelling in face and hands  Zone 2: CAUTION Call your doctor's office for any of the following:  BP reading is greater than 140 (top number) or greater than  90 (bottom number)  Stomach pain under your ribs in the middle or right side Headaches or seeing spots Feeling nauseated or throwing up Swelling in face and hands  Zone 3: EMERGENCY  Seek immediate medical care if you have any of the following:  BP reading is greater than160 (top number) or greater than 110 (bottom number) Severe headaches not improving with Tylenol Serious difficulty catching your breath Any worsening symptoms from  Zone 2     Second Trimester of Pregnancy The second trimester is from week 14 through week 27 (months 4 through 6). The second trimester is often a time when you feel your best. Your body has adjusted to being pregnant, and you begin to feel better physically. Usually, morning sickness has lessened or quit completely, you may have more energy, and you may have an increase in appetite. The second trimester is also a time when the fetus is growing rapidly. At the end of the sixth month, the fetus is about 9 inches long and weighs about 1 pounds. You will likely begin to feel the baby move (quickening) between 16 and 20 weeks of pregnancy. Body changes during your second trimester Your body continues to go through many changes during your second trimester. The changes vary from woman to woman. Your weight will continue to increase. You will notice your lower abdomen bulging out. You may begin to get stretch marks on your hips, abdomen, and breasts. You may develop headaches that can be relieved by medicines. The medicines should be approved by your health care provider. You may urinate more often because the fetus is pressing on your bladder. You may develop or continue to have heartburn as a result of your pregnancy. You may develop constipation because certain hormones are causing the muscles that push waste through your intestines to slow down. You may develop hemorrhoids or swollen, bulging veins (varicose veins). You may have back pain. This is caused by: Weight gain. Pregnancy hormones that are relaxing the joints in your pelvis. A shift in weight and the muscles that support your balance. Your breasts will continue to grow and they will continue to become tender. Your gums may bleed and may be sensitive to brushing and flossing. Dark spots or blotches (chloasma, mask of pregnancy) may develop on your face. This will likely fade after the baby is born. A dark line from your belly button to  the pubic area (linea nigra) may appear. This will likely fade after the baby is born. You may have changes in your hair. These can include thickening of your hair, rapid growth, and changes in texture. Some women also have hair loss during or after pregnancy, or hair that feels dry or thin. Your hair will most likely return to normal after your baby is born.  What to expect at prenatal visits During a routine prenatal visit: You will be weighed to make sure you and the fetus are growing normally. Your blood pressure will be taken. Your abdomen will be measured to track your baby's growth. The fetal heartbeat will be listened to. Any test results from the previous visit will be discussed.  Your health care provider may ask you: How you are feeling. If you are feeling the baby move. If you have had any abnormal symptoms, such as leaking fluid, bleeding, severe headaches, or abdominal cramping. If you are using any tobacco products, including cigarettes, chewing tobacco, and electronic cigarettes. If you have any questions.  Other tests that may be performed during   your second trimester include: Blood tests that check for: Low iron levels (anemia). High blood sugar that affects pregnant women (gestational diabetes) between 24 and 28 weeks. Rh antibodies. This is to check for a protein on red blood cells (Rh factor). Urine tests to check for infections, diabetes, or protein in the urine. An ultrasound to confirm the proper growth and development of the baby. An amniocentesis to check for possible genetic problems. Fetal screens for spina bifida and Down syndrome. HIV (human immunodeficiency virus) testing. Routine prenatal testing includes screening for HIV, unless you choose not to have this test.  Follow these instructions at home: Medicines Follow your health care provider's instructions regarding medicine use. Specific medicines may be either safe or unsafe to take during  pregnancy. Take a prenatal vitamin that contains at least 600 micrograms (mcg) of folic acid. If you develop constipation, try taking a stool softener if your health care provider approves. Eating and drinking Eat a balanced diet that includes fresh fruits and vegetables, whole grains, good sources of protein such as meat, eggs, or tofu, and low-fat dairy. Your health care provider will help you determine the amount of weight gain that is right for you. Avoid raw meat and uncooked cheese. These carry germs that can cause birth defects in the baby. If you have low calcium intake from food, talk to your health care provider about whether you should take a daily calcium supplement. Limit foods that are high in fat and processed sugars, such as fried and sweet foods. To prevent constipation: Drink enough fluid to keep your urine clear or pale yellow. Eat foods that are high in fiber, such as fresh fruits and vegetables, whole grains, and beans. Activity Exercise only as directed by your health care provider. Most women can continue their usual exercise routine during pregnancy. Try to exercise for 30 minutes at least 5 days a week. Stop exercising if you experience uterine contractions. Avoid heavy lifting, wear low heel shoes, and practice good posture. A sexual relationship may be continued unless your health care provider directs you otherwise. Relieving pain and discomfort Wear a good support bra to prevent discomfort from breast tenderness. Take warm sitz baths to soothe any pain or discomfort caused by hemorrhoids. Use hemorrhoid cream if your health care provider approves. Rest with your legs elevated if you have leg cramps or low back pain. If you develop varicose veins, wear support hose. Elevate your feet for 15 minutes, 3-4 times a day. Limit salt in your diet. Prenatal Care Write down your questions. Take them to your prenatal visits. Keep all your prenatal visits as told by your health  care provider. This is important. Safety Wear your seat belt at all times when driving. Make a list of emergency phone numbers, including numbers for family, friends, the hospital, and police and fire departments. General instructions Ask your health care provider for a referral to a local prenatal education class. Begin classes no later than the beginning of month 6 of your pregnancy. Ask for help if you have counseling or nutritional needs during pregnancy. Your health care provider can offer advice or refer you to specialists for help with various needs. Do not use hot tubs, steam rooms, or saunas. Do not douche or use tampons or scented sanitary pads. Do not cross your legs for long periods of time. Avoid cat litter boxes and soil used by cats. These carry germs that can cause birth defects in the baby and possibly loss of the   fetus by miscarriage or stillbirth. Avoid all smoking, herbs, alcohol, and unprescribed drugs. Chemicals in these products can affect the formation and growth of the baby. Do not use any products that contain nicotine or tobacco, such as cigarettes and e-cigarettes. If you need help quitting, ask your health care provider. Visit your dentist if you have not gone yet during your pregnancy. Use a soft toothbrush to brush your teeth and be gentle when you floss. Contact a health care provider if: You have dizziness. You have mild pelvic cramps, pelvic pressure, or nagging pain in the abdominal area. You have persistent nausea, vomiting, or diarrhea. You have a bad smelling vaginal discharge. You have pain when you urinate. Get help right away if: You have a fever. You are leaking fluid from your vagina. You have spotting or bleeding from your vagina. You have severe abdominal cramping or pain. You have rapid weight gain or weight loss. You have shortness of breath with chest pain. You notice sudden or extreme swelling of your face, hands, ankles, feet, or legs. You  have not felt your baby move in over an hour. You have severe headaches that do not go away when you take medicine. You have vision changes. Summary The second trimester is from week 14 through week 27 (months 4 through 6). It is also a time when the fetus is growing rapidly. Your body goes through many changes during pregnancy. The changes vary from woman to woman. Avoid all smoking, herbs, alcohol, and unprescribed drugs. These chemicals affect the formation and growth your baby. Do not use any tobacco products, such as cigarettes, chewing tobacco, and e-cigarettes. If you need help quitting, ask your health care provider. Contact your health care provider if you have any questions. Keep all prenatal visits as told by your health care provider. This is important. This information is not intended to replace advice given to you by your health care provider. Make sure you discuss any questions you have with your health care provider. Document Released: 03/20/2001 Document Revised: 09/01/2015 Document Reviewed: 05/27/2012 Elsevier Interactive Patient Education  2017 [place] [facility]AMILY [employer]ACTICE PH[employer]ICIANS  [facility] [address] [place], [place] [address] Phone - [phone]   Fax - [place] [address] [place], [place]  [address] Phone - [phone]   Fax - [phone]  [place] [address], [place] [address] [place], [place]  [address] Phone - [phone]   Fax - [phone]  [facility] [address] [place], [place]  [address] Phone - [phone]   Fax - [phone]  [place] [address] [place], [place]  [address] Phone - [phone]   Fax - [place] [address] [place], [place]  [address] Phone - [phone]   Fax - [place] [facility] [place] [address], [place], [place]  [address] Phone - [phone]   Fax - [phone]  [place] [address] [place], [place], [place]  [address] Phone - [phone]   Fax - [place] [address] [place], [place]  [address] Phone - [phone]   Fax - [phone] [facility] [place]. [address] [place],   [address] Phone - [phone]   Fax - [phone]  [place] [address] [place], [place]  [place] Phone - [phone]   Fax - [phone]  [facility] [place], [place], [place]  [address] Phone - [phone]   Fax - [place] [address], [place], [place]  [address] Phone - [phone]   Fax - [phone]  [facility] [address] [place], [place]  [address] Phone - [place], [place]  [address] Phone - [phone]   Fax - [place] [address], [place], [place]  [address] Phone - [phone]   Fax - [phone]  [place] [address] [place], [place]  [address] Phone - [phone]   Fax - [place]. [place], [place]  [address] Phone - [phone]   Fax - [place], [place], [place]  [address] Phone - [phone]   Fax - [place] [address], [place], [place]  [address] Phone - [phone]   Fax - [phone]  [place] [address] [place], [place]  [address] Phone - [phone]   Fax - [place] [address], [place], [place]  [address] Phone - [phone]   Fax - [phone]  [place] [address] [place], [place]  [address] Phone - [phone]   Fax - [phone] [place] [address] [place], [place]  [address] Phone - [phone]   Fax - [place] [address] [place], [place]  [address] Phone - [phone]   Fax - [place] [address], [place], [place]  [address] Phone - [phone]   Fax - [phone]

## 2021-10-16 NOTE — Progress Notes (Signed)
[place] 21+6 wks,cephalic,anterior placenta gr 0,normal ovaries,CX 3.1 cm,SVP of fluid 6.1 cm,FHR 144 bpm,2 LVEICF,1 RVEICF,EFW 500 g 71%,anatomy complete,

## 2021-10-18 ENCOUNTER — Other Ambulatory Visit: Payer: Self-pay

## 2021-10-18 ENCOUNTER — Ambulatory Visit (INDEPENDENT_AMBULATORY_CARE_PROVIDER_SITE_OTHER): Payer: Medicaid Other | Admitting: Family

## 2021-10-18 ENCOUNTER — Encounter: Payer: Self-pay | Admitting: Family

## 2021-10-18 VITALS — BP 104/72 | HR 96 | Temp 98.2°F | Wt 146.0 lb

## 2021-10-18 DIAGNOSIS — O0992 Supervision of high risk pregnancy, unspecified, second trimester: Secondary | ICD-10-CM | POA: Diagnosis not present

## 2021-10-18 DIAGNOSIS — Z3A22 22 weeks gestation of pregnancy: Secondary | ICD-10-CM

## 2021-10-18 DIAGNOSIS — O98712 Human immunodeficiency virus [HIV] disease complicating pregnancy, second trimester: Secondary | ICD-10-CM | POA: Diagnosis not present

## 2021-10-18 DIAGNOSIS — B20 Human immunodeficiency virus [HIV] disease: Secondary | ICD-10-CM

## 2021-10-18 NOTE — Progress Notes (Signed)
Brief Narrative   Patient ID: Alyssa Sandoval, female    DOB: 2000/06/05, 21 y.o.   MRN: [id number]  Alyssa Sandoval is a 21 y/o AA female diagnosed with HIV disease with risk factor of heterosexual contact. Initial CD4 count count of 566 with viral load of 3,460. Genotype with no significant medication resistance mutations. No history of opportunistic infection. [id number] negative. Sole ART medication of Biktarvy. Entered care at [facility] Stage 1.   Subjective:    Chief Complaint  Patient presents with   Follow-up   HIV Positive/AIDS    HPI:  Alyssa Sandoval is a 21 y.o. female with HIV disease and recently pregnant last seen on 09/18/2021 with poorly controlled virus and less than optimal adherence to her ART regimen.  Viral load was 911 with CD4 count of 819. Has been seen by OB for routine screening and ultrasound. Here today for follow up.  Alyssa Sandoval has been taking her Biktarvy daily as prescribed with no adverse side effects. Feeling well with no new concerns/complaints. Denies fevers, chills, night sweats, headaches, changes in vision, neck pain/stiffness, nausea, diarrhea, vomiting, lesions or rashes. No problems obtaining medications    No Known Allergies    Outpatient Medications Prior to Visit  Medication Sig Dispense Refill   bictegravir-emtricitabine-tenofovir AF (BIKTARVY) 50-200-25 MG TABS tablet Take 1 tablet by mouth daily. 30 tablet 11   Prenatal Vit-Fe Fumarate-FA (PRENATAL MULTIVITAMIN) TABS tablet Take 1 tablet by mouth daily at 12 noon.     aspirin EC 81 MG tablet Take 1 tablet (81 mg total) by mouth daily. Swallow whole. (Patient not taking: Reported on 09/11/2021) 30 tablet 11   escitalopram (LEXAPRO) 5 MG tablet Take 1 tablet (5 mg total) by mouth daily. (Patient not taking: Reported on 07/04/2021) 30 tablet 4   ondansetron (ZOFRAN-ODT) 4 MG disintegrating tablet Take 1 tablet (4 mg total) by mouth every 8 (eight) hours as needed for nausea or vomiting. (Patient not taking:  Reported on 10/16/2021) 20 tablet 1   QUEtiapine (SEROQUEL) 50 MG tablet Take 1 tablet (50 mg total) by mouth at bedtime. (Patient not taking: Reported on 07/04/2021) 90 tablet 3   No facility-administered medications prior to visit.     Past Medical History:  Diagnosis Date   Chronic constipation 06/10/2012   COVID-19 04/2020   Dysfunctional uterine bleeding    GERD (gastroesophageal reflux disease) 06/10/2012   Headache 12/09/2020   High-risk pregnancy 07/13/2021   HIV (human immunodeficiency virus infection) ([place])    Low back pain 12/09/2020   Passive suicidal ideations 11/07/2020   Vaginal pruritus 03/15/2021   Vaginal yeast infection      Past Surgical History:  Procedure Laterality Date   NO PAST SURGERIES        Review of Systems  Constitutional:  Negative for appetite change, chills, diaphoresis, fatigue, fever and unexpected weight change.  Eyes:        Negative for acute change in vision  Respiratory:  Negative for chest tightness, shortness of breath and wheezing.   Cardiovascular:  Negative for chest pain.  Gastrointestinal:  Negative for diarrhea, nausea and vomiting.  Genitourinary:  Negative for dysuria, pelvic pain and vaginal discharge.  Musculoskeletal:  Negative for neck pain and neck stiffness.  Skin:  Negative for rash.  Neurological:  Negative for seizures, syncope, weakness and headaches.  Hematological:  Negative for adenopathy. Does not bruise/bleed easily.  Psychiatric/Behavioral:  Negative for hallucinations.       Objective:    BP  104/72   Pulse 96   Temp 98.2 F (36.8 C) (Oral)   Wt 146 lb (66.2 kg)   LMP  (LMP Unknown) Comment: a month ago.  + home pregnancy test on 3/24  SpO2 98%   BMI 30.51 kg/m  Nursing note and vital signs reviewed.  Physical Exam Constitutional:      General: She is not in acute distress.    Appearance: She is well-developed.  Eyes:     Conjunctiva/sclera: Conjunctivae normal.  Cardiovascular:     Rate  and Rhythm: Normal rate and regular rhythm.     Heart sounds: Normal heart sounds. No murmur heard.    No friction rub. No gallop.  Pulmonary:     Effort: Pulmonary effort is normal. No respiratory distress.     Breath sounds: Normal breath sounds. No wheezing or rales.  Chest:     Chest wall: No tenderness.  Abdominal:     General: Bowel sounds are normal.     Palpations: Abdomen is soft.     Tenderness: There is no abdominal tenderness.  Musculoskeletal:     Cervical back: Neck supple.  Lymphadenopathy:     Cervical: No cervical adenopathy.  Skin:    General: Skin is warm and dry.     Findings: No rash.  Neurological:     Mental Status: She is alert and oriented to person, place, and time.  Psychiatric:        Behavior: Behavior normal.        Thought Content: Thought content normal.        Judgment: Judgment normal.         09/18/2021    1:53 PM 08/09/2021    3:55 PM 05/02/2021    8:32 AM 03/15/2021    9:39 AM 10/19/2020    2:11 PM  Depression screen PHQ 2/9  Decreased Interest 0 1 0 0 3  Down, Depressed, Hopeless 0 0 0 1 2  PHQ - 2 Score 0 1 0 1 5  Altered sleeping  1   3  Tired, decreased energy  1   3  Change in appetite  1   2  Feeling bad or failure about yourself   0   2  Trouble concentrating  0   0  Moving slowly or fidgety/restless  0   2  Suicidal thoughts  0   0  PHQ-9 Score  4   17       Assessment & Plan:    Patient Active Problem List   Diagnosis Date Noted   Pyelonephritis 08/19/2021   HSV (herpes simplex virus) anogenital infection 08/09/2021   High-risk pregnancy, second trimester 08/08/2021   Headache 12/09/2020   Low back pain 12/09/2020   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   Passive suicidal ideations 11/07/2020   HIV disease ([place]) 11/01/2020   RLQ abdominal pain 10/21/2020   Irregular menses 10/21/2020     Problem List Items Addressed This Visit       Other   HIV disease ([place]) - Primary    Alyssa Sandoval has improved adherence and good  tolerance to her ART regimen of Biktarvy.  We reviewed previous lab work and discussed plan of care including upcoming HIV care in pregnancy.  Ultimate goal is to maintain viral suppression and reduce risk of transmission to her fetus.  Continue current dose of Biktarvy.  Check blood work today.  Plan for follow-up in 2 months or sooner if needed with lab work on the same  day.      Relevant Orders   HIV-1 RNA quant-no reflex-bld   T-helper cell (CD4)- ([facility] clinic only)   Comp Met ([facility])   High-risk pregnancy, second trimester    Alyssa Sandoval is currently in her second trimester pregnancy and appears to have better controlled HIV disease.  Check lab work today.  Continue obstetrics care in addition to HIV care.        I am having Alyssa Sandoval maintain her QUEtiapine, escitalopram, prenatal multivitamin, Biktarvy, aspirin EC, and ondansetron.   Follow-up: Return in about 2 months (around 12/19/2021), or if symptoms worsen or fail to improve.   Terri Piedra, MSN, FNP-C Nurse Practitioner [facility] [place] number: [phone]

## 2021-10-18 NOTE — Telephone Encounter (Signed)
NO

## 2021-10-18 NOTE — Assessment & Plan Note (Signed)
[name] has improved adherence and good tolerance to her ART regimen of Biktarvy.  We reviewed previous lab work and discussed plan of care including upcoming HIV care in pregnancy.  Ultimate goal is to maintain viral suppression and reduce risk of transmission to her fetus.  Continue current dose of Biktarvy.  Check blood work today.  Plan for follow-up in 2 months or sooner if needed with lab work on the same day.

## 2021-10-18 NOTE — Patient Instructions (Addendum)
Nice to see you.  We will check your lab work today.  Continue to take your medication daily as prescribed.  Refills have been sent to the pharmacy.  Plan for follow up in 2 months or sooner if needed with lab work on the same day.  Have a great day and stay safe!  

## 2021-10-18 NOTE — Assessment & Plan Note (Signed)
[name] is currently in her second trimester pregnancy and appears to have better controlled HIV disease.  Check lab work today.  Continue obstetrics care in addition to HIV care.

## 2021-10-19 LAB — T-HELPER CELL (CD4) - (RCID CLINIC ONLY)
CD4 % Helper T Cell: 44 % (ref 33–65)
CD4 T Cell Abs: 1071 /uL (ref 400–1790)

## 2021-10-20 ENCOUNTER — Inpatient Hospital Stay (HOSPITAL_COMMUNITY)
Admission: AD | Admit: 2021-10-20 | Discharge: 2021-10-21 | Disposition: A | Discharge status: Home / Self Care | Payer: Medicaid Other | Attending: Obstetrics & Gynecology | Admitting: Obstetrics & Gynecology

## 2021-10-20 ENCOUNTER — Encounter (HOSPITAL_COMMUNITY): Payer: Self-pay | Admitting: Obstetrics & Gynecology

## 2021-10-20 ENCOUNTER — Inpatient Hospital Stay (HOSPITAL_BASED_OUTPATIENT_CLINIC_OR_DEPARTMENT_OTHER): Payer: Medicaid Other

## 2021-10-20 DIAGNOSIS — Z3A22 22 weeks gestation of pregnancy: Secondary | ICD-10-CM | POA: Diagnosis not present

## 2021-10-20 DIAGNOSIS — O0992 Supervision of high risk pregnancy, unspecified, second trimester: Secondary | ICD-10-CM

## 2021-10-20 DIAGNOSIS — O26899 Other specified pregnancy related conditions, unspecified trimester: Secondary | ICD-10-CM

## 2021-10-20 DIAGNOSIS — R109 Unspecified abdominal pain: Secondary | ICD-10-CM | POA: Diagnosis not present

## 2021-10-20 DIAGNOSIS — R102 Pelvic and perineal pain: Secondary | ICD-10-CM | POA: Diagnosis not present

## 2021-10-20 DIAGNOSIS — M549 Dorsalgia, unspecified: Secondary | ICD-10-CM | POA: Insufficient documentation

## 2021-10-20 DIAGNOSIS — O26892 Other specified pregnancy related conditions, second trimester: Secondary | ICD-10-CM | POA: Insufficient documentation

## 2021-10-20 LAB — URINALYSIS, ROUTINE W REFLEX MICROSCOPIC
Bilirubin Urine: NEGATIVE
Glucose, UA: NEGATIVE mg/dL
Hgb urine dipstick: NEGATIVE
Ketones, ur: NEGATIVE mg/dL
Nitrite: NEGATIVE
Protein, ur: NEGATIVE mg/dL
Specific Gravity, Urine: 1.014 (ref 1.005–1.030)
pH: 7 (ref 5.0–8.0)

## 2021-10-20 MED ORDER — TRAMADOL HCL 50 MG PO TABS
50.0000 mg | ORAL_TABLET | Freq: Once | ORAL | Status: AC
  Administered 2021-10-20: 50 mg via ORAL
  Filled 2021-10-20: qty 1

## 2021-10-20 MED ORDER — LACTATED RINGERS IV BOLUS
1000.0000 mL | Freq: Once | INTRAVENOUS | Status: AC
  Administered 2021-10-20: 1000 mL via INTRAVENOUS

## 2021-10-20 NOTE — MAU Note (Addendum)
Pt says has lower abd pain- started 11am today Then back pain started at 1pm [facility]- told to come here Pt just now came bc she thought it would go away Last sex- yesterday  Took 2 XS Tyl at 8pm- no relief- took away H/A

## 2021-10-20 NOTE — MAU Provider Note (Signed)
History     [id number]  Arrival date and time: 10/20/21 2141    Chief Complaint  Patient presents with   Abdominal Pain   Back Pain     HPI Alyssa Sandoval is a 21 y.o. at [redacted]w[redacted]d who presents for abdominal & back pain. Symptoms started this morning. Reports intermittent pain that occurs every 5 minutes & feels like abdominal tightening. Had headache earlier. Took tylenol this evening - helped with headache but not with abdominal pain. Denies fever, n/v/d, constipation, dysuria, hematuria, vaginal bleeding, or LOF. +Fetal movement. No recent intercourse.    OB History     Gravida  2   Para  0   Term  0   Preterm  0   AB  1   Living  0      SAB  1   IAB  0   Ectopic  0   Multiple  0   Live Births  0           Past Medical History:  Diagnosis Date   Chronic constipation 06/10/2012   COVID-19 04/2020   Dysfunctional uterine bleeding    GERD (gastroesophageal reflux disease) 06/10/2012   Headache 12/09/2020   High-risk pregnancy 07/13/2021   HIV (human immunodeficiency virus infection) (HCC)    Low back pain 12/09/2020   Passive suicidal ideations 11/07/2020   Vaginal pruritus 03/15/2021   Vaginal yeast infection     Past Surgical History:  Procedure Laterality Date   NO PAST SURGERIES      Family History  Problem Relation Age of Onset   Obesity Mother    Fibromyalgia Mother    Mental illness Mother    Hypertension Maternal Grandmother    Colon cancer Maternal Grandfather     No Known Allergies  No current facility-administered medications on file prior to encounter.   Current Outpatient Medications on File Prior to Encounter  Medication Sig Dispense Refill   bictegravir-emtricitabine-tenofovir AF (BIKTARVY) 50-200-25 MG TABS tablet Take 1 tablet by mouth daily. 30 tablet 11   Prenatal Vit-Fe Fumarate-FA (PRENATAL MULTIVITAMIN) TABS tablet Take 1 tablet by mouth daily at 12 noon.     aspirin EC 81 MG tablet Take 1 tablet (81 mg total) by  mouth daily. Swallow whole. (Patient not taking: Reported on 09/11/2021) 30 tablet 11   escitalopram (LEXAPRO) 5 MG tablet Take 1 tablet (5 mg total) by mouth daily. (Patient not taking: Reported on 07/04/2021) 30 tablet 4   ondansetron (ZOFRAN-ODT) 4 MG disintegrating tablet Take 1 tablet (4 mg total) by mouth every 8 (eight) hours as needed for nausea or vomiting. (Patient not taking: Reported on 10/16/2021) 20 tablet 1   QUEtiapine (SEROQUEL) 50 MG tablet Take 1 tablet (50 mg total) by mouth at bedtime. (Patient not taking: Reported on 07/04/2021) 90 tablet 3   [DISCONTINUED] norelgestromin-ethinyl estradiol (ORTHO EVRA) 150-35 MCG/24HR transdermal patch Place 1 patch onto the skin once a week. 3 patch 12     ROS Pertinent positives and negative per HPI, all others reviewed and negative  Physical Exam   BP 114/74   Pulse 87   Temp 98 F (36.7 C) (Oral)   Resp 18   Ht 4\' 10"  (1.473 m)   Wt 66.7 kg   LMP  (LMP Unknown) Comment: a month ago.  + home pregnancy test on 3/24  BMI 30.72 kg/m   Patient Vitals for the past 24 hrs:  BP Temp Temp src Pulse Resp Height Weight  10/20/21 2213 114/74 -- --  87 -- -- --  10/20/21 2212 114/74 -- -- -- -- -- --  10/20/21 2151 117/67 98 F (36.7 C) Oral 89 18 4\' 10"  (1.473 m) 66.7 kg    Physical Exam Vitals and nursing note reviewed. Exam conducted with a chaperone present.  Constitutional:      General: She is not in acute distress.    Appearance: She is well-developed. She is not ill-appearing.  HENT:     Head: Normocephalic and atraumatic.  Pulmonary:     Effort: Pulmonary effort is normal. No respiratory distress.  Abdominal:     Palpations: Abdomen is soft.     Tenderness: There is no abdominal tenderness.  Skin:    General: Skin is warm and dry.  Neurological:     Mental Status: She is alert.      Cervical Exam Dilation: Closed Effacement (%): Thick Station: Ballotable Exam by:: [email or web address] NP    Labs Results for orders  placed or performed during the hospital encounter of 10/20/21 (from the past 24 hour(s))  Urinalysis, Routine w reflex microscopic Urine, Clean Catch     Status: Abnormal   Collection Time: 10/20/21 10:04 PM  Result Value Ref Range   Color, Urine YELLOW YELLOW   APPearance CLOUDY (A) CLEAR   Specific Gravity, Urine 1.014 1.005 - 1.030   pH 7.0 5.0 - 8.0   Glucose, UA NEGATIVE NEGATIVE mg/dL   Hgb urine dipstick NEGATIVE NEGATIVE   Bilirubin Urine NEGATIVE NEGATIVE   Ketones, ur NEGATIVE NEGATIVE mg/dL   Protein, ur NEGATIVE NEGATIVE mg/dL   Nitrite NEGATIVE NEGATIVE   Leukocytes,Ua TRACE (A) NEGATIVE   RBC / HPF 0-5 0 - 5 RBC/hpf   WBC, UA 6-10 0 - 5 WBC/hpf   Bacteria, UA RARE (A) NONE SEEN   Squamous Epithelial / LPF 11-20 0 - 5   Mucus PRESENT     Imaging No results found.  MAU Course  Procedures Lab Orders         Culture, OB Urine         Urinalysis, Routine w reflex microscopic Urine, Clean Catch     Meds ordered this encounter  Medications   lactated ringers bolus 1,000 mL   traMADol (ULTRAM) tablet 50 mg   Imaging Orders         10/22/21 MFM OB LIMITED      MDM FHT present via doppler Some uterine irritability on TOCO Cervix closed Cervical length 3.1 cm per ultrasound  Treatment included IV fluid bolus & tramadol.  Patient reports improvement in symptoms & ready for discharge home Assessment and Plan   1. Abdominal cramping affecting pregnancy   2. [redacted] weeks gestation of pregnancy    -Reviewed reasons to return to MAU   [place], NP 10/21/21 12:19 AM

## 2021-10-21 DIAGNOSIS — O26899 Other specified pregnancy related conditions, unspecified trimester: Secondary | ICD-10-CM

## 2021-10-21 DIAGNOSIS — R109 Unspecified abdominal pain: Secondary | ICD-10-CM | POA: Diagnosis not present

## 2021-10-21 DIAGNOSIS — Z3A22 22 weeks gestation of pregnancy: Secondary | ICD-10-CM | POA: Diagnosis not present

## 2021-10-22 LAB — COMPREHENSIVE METABOLIC PANEL
AG Ratio: 1.5 (calc) (ref 1.0–2.5)
ALT: 10 U/L (ref 6–29)
AST: 11 U/L (ref 10–30)
Albumin: 3.7 g/dL (ref 3.6–5.1)
Alkaline phosphatase (APISO): 67 U/L (ref 31–125)
BUN/Creatinine Ratio: 15 (calc) (ref 6–22)
BUN: 7 mg/dL (ref 7–25)
CO2: 22 mmol/L (ref 20–32)
Calcium: 8.7 mg/dL (ref 8.6–10.2)
Chloride: 105 mmol/L (ref 98–110)
Creat: 0.46 mg/dL — ABNORMAL LOW (ref 0.50–0.96)
Globulin: 2.4 g/dL (calc) (ref 1.9–3.7)
Glucose, Bld: 87 mg/dL (ref 65–99)
Potassium: 3.7 mmol/L (ref 3.5–5.3)
Sodium: 135 mmol/L (ref 135–146)
Total Bilirubin: 0.3 mg/dL (ref 0.2–1.2)
Total Protein: 6.1 g/dL (ref 6.1–8.1)

## 2021-10-22 LAB — HIV-1 RNA QUANT-NO REFLEX-BLD
HIV 1 RNA Quant: 34 Copies/mL — ABNORMAL HIGH
HIV-1 RNA Quant, Log: 1.53 Log cps/mL — ABNORMAL HIGH

## 2021-10-22 LAB — CULTURE, OB URINE: Special Requests: NORMAL

## 2021-11-06 ENCOUNTER — Encounter: Payer: Self-pay | Admitting: Obstetrics & Gynecology

## 2021-11-06 ENCOUNTER — Ambulatory Visit (INDEPENDENT_AMBULATORY_CARE_PROVIDER_SITE_OTHER): Payer: Medicaid Other | Admitting: Obstetrics & Gynecology

## 2021-11-06 VITALS — BP 110/74 | HR 115 | Wt 150.0 lb

## 2021-11-06 DIAGNOSIS — B20 Human immunodeficiency virus [HIV] disease: Secondary | ICD-10-CM

## 2021-11-06 DIAGNOSIS — O0992 Supervision of high risk pregnancy, unspecified, second trimester: Secondary | ICD-10-CM

## 2021-11-06 NOTE — Progress Notes (Signed)
HIGH-RISK PREGNANCY VISIT Patient name: Alyssa Sandoval MRN [id number]  Date of birth: 06-10-2000 Chief Complaint:   Routine Prenatal Visit  History of Present Illness:   Alyssa Sandoval is a 21 y.o. G2P0010 female at [redacted]w[redacted]d with an Estimated Date of Delivery: 02/20/22 being seen today for ongoing management of a high-risk pregnancy complicated by +HIV.    Today she reports no complaints. Contractions: Not present. Vag. Bleeding: None.  Movement: Present. denies leaking of fluid.      09/18/2021    1:53 PM 08/09/2021    3:55 PM 05/02/2021    8:32 AM 03/15/2021    9:39 AM 10/19/2020    2:11 PM  Depression screen PHQ 2/9  Decreased Interest 0 1 0 0 3  Down, Depressed, Hopeless 0 0 0 1 2  PHQ - 2 Score 0 1 0 1 5  Altered sleeping  1   3  Tired, decreased energy  1   3  Change in appetite  1   2  Feeling bad or failure about yourself   0   2  Trouble concentrating  0   0  Moving slowly or fidgety/restless  0   2  Suicidal thoughts  0   0  PHQ-9 Score  4   17        08/09/2021    3:56 PM 10/19/2020    2:11 PM  GAD 7 : Generalized Anxiety Score  Nervous, Anxious, on Edge 1 3  Control/stop worrying 0 3  Worry too much - different things 1 3  Trouble relaxing 0 3  Restless 0 0  Easily annoyed or irritable 0 3  Afraid - awful might happen 0 3  Total GAD 7 Score 2 18     Review of Systems:   Pertinent items are noted in HPI Denies abnormal vaginal discharge w/ itching/odor/irritation, headaches, visual changes, shortness of breath, chest pain, abdominal pain, severe nausea/vomiting, or problems with urination or bowel movements unless otherwise stated above. Pertinent History Reviewed:  Reviewed past medical,surgical, social, obstetrical and family history.  Reviewed problem list, medications and allergies. Physical Assessment:   Vitals:   11/06/21 1509  BP: 110/74  Pulse: (!) 115  Weight: 150 lb (68 kg)  Body mass index is 31.35 kg/m.           Physical Examination:    General appearance: alert, well appearing, and in no distress  Mental status: alert, oriented to person, place, and time  Skin: warm & dry   Extremities: Edema: None    Cardiovascular: normal heart rate noted  Respiratory: normal respiratory effort, no distress  Abdomen: gravid, soft, non-tender  Pelvic: Cervical exam deferred         Fetal Status:     Movement: Present    Fetal Surveillance Testing today: [id number]   Chaperone: N/A    No results found for this or any previous visit (from the past 24 hour(s)).  Assessment & Plan:  High-risk pregnancy: G2P0010 at [redacted]w[redacted]d with an Estimated Date of Delivery: 02/20/22      ICD-10-CM   1. Encounter for supervision of high risk pregnancy in second trimester, antepartum  O09.92     2. HIV disease (HCC) on Biktarvy  B20    titers are dropping, fairly newly diagnosed, followed by Dr Daiva Eves        Meds: No orders of the defined types were placed in this encounter.   Orders: No orders of the defined types were placed  in this encounter.    Labs/procedures today:   Treatment Plan:  PN2 next visit, add viral load to lab work    Follow-up: Return in about 3 weeks (around 11/27/2021) for PN2, HROB.   Future Appointments  Date Time Provider Department Center  11/28/2021  8:30 AM [facility]  11/28/2021  9:00 AM [facility] [place] [facility] None  11/28/2021 10:30 AM Myna Hidalgo, DO [facility]  12/19/2021  9:45 AM Carver Fila Tama Headings, FNP [facility]    No orders of the defined types were placed in this encounter.  Lazaro Arms  Attending Physician for the Center for [facility] 11/06/2021 3:33 PM

## 2021-11-21 ENCOUNTER — Encounter (HOSPITAL_COMMUNITY): Payer: Self-pay | Admitting: Obstetrics and Gynecology

## 2021-11-21 ENCOUNTER — Inpatient Hospital Stay (HOSPITAL_COMMUNITY)
Admission: AD | Admit: 2021-11-21 | Discharge: 2021-11-22 | Disposition: A | Discharge status: Home / Self Care | Payer: Medicaid Other | Attending: Obstetrics and Gynecology | Admitting: Obstetrics and Gynecology

## 2021-11-21 DIAGNOSIS — O26892 Other specified pregnancy related conditions, second trimester: Secondary | ICD-10-CM | POA: Insufficient documentation

## 2021-11-21 DIAGNOSIS — M545 Low back pain, unspecified: Secondary | ICD-10-CM | POA: Insufficient documentation

## 2021-11-21 DIAGNOSIS — R82998 Other abnormal findings in urine: Secondary | ICD-10-CM

## 2021-11-21 DIAGNOSIS — Z3A27 27 weeks gestation of pregnancy: Secondary | ICD-10-CM | POA: Insufficient documentation

## 2021-11-21 DIAGNOSIS — Z79899 Other long term (current) drug therapy: Secondary | ICD-10-CM | POA: Insufficient documentation

## 2021-11-21 DIAGNOSIS — O0992 Supervision of high risk pregnancy, unspecified, second trimester: Secondary | ICD-10-CM

## 2021-11-21 DIAGNOSIS — M549 Dorsalgia, unspecified: Secondary | ICD-10-CM

## 2021-11-21 DIAGNOSIS — N858 Other specified noninflammatory disorders of uterus: Secondary | ICD-10-CM | POA: Diagnosis not present

## 2021-11-21 DIAGNOSIS — R1030 Lower abdominal pain, unspecified: Secondary | ICD-10-CM | POA: Insufficient documentation

## 2021-11-21 DIAGNOSIS — O99891 Other specified diseases and conditions complicating pregnancy: Secondary | ICD-10-CM | POA: Diagnosis not present

## 2021-11-21 DIAGNOSIS — R109 Unspecified abdominal pain: Secondary | ICD-10-CM | POA: Diagnosis present

## 2021-11-21 LAB — URINALYSIS, ROUTINE W REFLEX MICROSCOPIC
Bilirubin Urine: NEGATIVE
Glucose, UA: NEGATIVE mg/dL
Hgb urine dipstick: NEGATIVE
Ketones, ur: NEGATIVE mg/dL
Nitrite: NEGATIVE
Protein, ur: 30 mg/dL — AB
Specific Gravity, Urine: 1.025 (ref 1.005–1.030)
pH: 5 (ref 5.0–8.0)

## 2021-11-21 MED ORDER — NIFEDIPINE 10 MG PO CAPS
10.0000 mg | ORAL_CAPSULE | ORAL | Status: DC | PRN
  Administered 2021-11-21: 10 mg via ORAL

## 2021-11-21 MED ORDER — NIFEDIPINE 10 MG PO CAPS
ORAL_CAPSULE | ORAL | Status: AC
  Filled 2021-11-21: qty 1

## 2021-11-21 NOTE — MAU Note (Signed)
.  Alyssa Sandoval is a 21 y.o. at [redacted]w[redacted]d here in MAU reporting some upper and lower abdominal pain for a week, some lower back pain, and increase in vaginal d/c. Good FM. No VB  Onset of complaint: 1 week Pain score: 4 for abdomen and 7 for back  Vitals:   11/21/21 2226  Pulse: (!) 107  SpO2: 99%     FHT:154 Lab orders placed from triage:  u/a

## 2021-11-21 NOTE — MAU Provider Note (Signed)
Chief Complaint:  Abdominal Pain   Event Date/Time   First Provider Initiated Contact with Patient 11/21/21 2305     HPI: Alyssa Sandoval is a 21 y.o. G2P0010 at 70w0dwho presents to maternity admissions reporting pain in lower abdomen and low back for a week.  Does not report upper abdominal pain to me. Increased vaginal discharge, not watery, no odor. . She reports good fetal movement, denies LOF, vaginal bleeding, vaginal itching/burning, urinary symptoms, h/a, dizziness, n/v, diarrhea, constipation or fever/chills.   Abdominal Pain This is a new problem. The current episode started in the past 7 days. The onset quality is gradual. The problem occurs intermittently. The problem is unchanged. The pain is located in the LLQ, RLQ and suprapubic region. The quality of the pain is described as cramping. The pain does not radiate. Pertinent negatives include no constipation, diarrhea, dysuria, fever, frequency, nausea or vomiting. Nothing relieves the symptoms. Past treatments include nothing.   RN Note: Alyssa Sandoval is a 21 y.o. at [redacted]w[redacted]d here in MAU reporting some upper and lower abdominal pain for a week, some lower back pain, and increase in vaginal d/c. Good FM. No VB  Onset of complaint: 1 week Pain score: 4 for abdomen and 7 for back    Past Medical History: Past Medical History:  Diagnosis Date   Chronic constipation 06/10/2012   COVID-19 04/2020   Dysfunctional uterine bleeding    GERD (gastroesophageal reflux disease) 06/10/2012   Headache 12/09/2020   High-risk pregnancy 07/13/2021   HIV (human immunodeficiency virus infection) (HCC)    Low back pain 12/09/2020   Passive suicidal ideations 11/07/2020   Vaginal pruritus 03/15/2021   Vaginal yeast infection     Past obstetric history: OB History  Gravida Para Term Preterm AB Living  2 0 0 0 1 0  SAB IAB Ectopic Multiple Live Births  1 0 0 0 0    # Outcome Date GA Lbr Len/2nd Weight Sex Delivery Anes PTL Lv  2 Current            1 SAB 2020            Past Surgical History: Past Surgical History:  Procedure Laterality Date   NO PAST SURGERIES      Family History: Family History  Problem Relation Age of Onset   Obesity Mother    Fibromyalgia Mother    Mental illness Mother    Hypertension Maternal Grandmother    Colon cancer Maternal Grandfather     Social History: Social History   Tobacco Use   Smoking status: Former    Types: Software engineer, E-cigarettes   Smokeless tobacco: Never   Tobacco comments:    vapes  Vaping Use   Vaping Use: Never used  Substance Use Topics   Alcohol use: No   Drug use: Not Currently    Types: Marijuana    Allergies: No Known Allergies  Meds:  Medications Prior to Admission  Medication Sig Dispense Refill Last Dose   aspirin EC 81 MG tablet Take 1 tablet (81 mg total) by mouth daily. Swallow whole. 30 tablet 11 11/21/2021   bictegravir-emtricitabine-tenofovir AF (BIKTARVY) 50-200-25 MG TABS tablet Take 1 tablet by mouth daily. 30 tablet 11 11/20/2021   Prenatal Vit-Fe Fumarate-FA (PRENATAL MULTIVITAMIN) TABS tablet Take 1 tablet by mouth daily at 12 noon.   11/21/2021   escitalopram (LEXAPRO) 5 MG tablet Take 1 tablet (5 mg total) by mouth daily. (Patient not taking: Reported on 07/04/2021) 30 tablet 4  ondansetron (ZOFRAN-ODT) 4 MG disintegrating tablet Take 1 tablet (4 mg total) by mouth every 8 (eight) hours as needed for nausea or vomiting. (Patient not taking: Reported on 10/16/2021) 20 tablet 1    QUEtiapine (SEROQUEL) 50 MG tablet Take 1 tablet (50 mg total) by mouth at bedtime. (Patient not taking: Reported on 07/04/2021) 90 tablet 3     I have reviewed patient's Past Medical Hx, Surgical Hx, Family Hx, Social Hx, medications and allergies.   ROS:  Review of Systems  Constitutional:  Negative for fever.  Gastrointestinal:  Positive for abdominal pain. Negative for constipation, diarrhea, nausea and vomiting.  Genitourinary:  Negative for dysuria and frequency.    Other systems negative  Physical Exam  Patient Vitals for the past 24 hrs:  BP Temp Pulse Resp SpO2 Height Weight  11/21/21 2230 114/71 98.2 F (36.8 C) (!) 101 17 99 % -- --  11/21/21 2226 -- -- (!) 107 -- 99 % 4\' 10"  (1.473 m) 70.3 kg   Constitutional: Well-developed, well-nourished female in no acute distress.  Cardiovascular: normal rate and rhythm Respiratory: normal effort GI: Abd soft, non-tender, gravid appropriate for gestational age.   No rebound or guarding. MS: Extremities nontender, no edema, normal ROM Neurologic: Alert and oriented x 4.  GU: Neg CVAT.  PELVIC EXAM:  Fetal fibronectin sent Dilation: Closed Effacement (%): Thick Station: Ballotable Exam by:: [email or web address] CNM   FHT:  Baseline 135 , moderate variability, accelerations present, no decelerations Contractions: Uterine irritability   Labs: Results for orders placed or performed during the hospital encounter of 11/21/21 (from the past 24 hour(s))  Urinalysis, Routine w reflex microscopic Urine, Clean Catch     Status: Abnormal   Collection Time: 11/21/21 10:53 PM  Result Value Ref Range   Color, Urine YELLOW YELLOW   APPearance HAZY (A) CLEAR   Specific Gravity, Urine 1.025 1.005 - 1.030   pH 5.0 5.0 - 8.0   Glucose, UA NEGATIVE NEGATIVE mg/dL   Hgb urine dipstick NEGATIVE NEGATIVE   Bilirubin Urine NEGATIVE NEGATIVE   Ketones, ur NEGATIVE NEGATIVE mg/dL   Protein, ur 30 (A) NEGATIVE mg/dL   Nitrite NEGATIVE NEGATIVE   Leukocytes,Ua LARGE (A) NEGATIVE   RBC / HPF 0-5 0 - 5 RBC/hpf   WBC, UA 21-50 0 - 5 WBC/hpf   Bacteria, UA FEW (A) NONE SEEN   Squamous Epithelial / LPF 6-10 0 - 5   Mucus PRESENT   Fetal fibronectin     Status: None   Collection Time: 11/21/21 11:20 PM  Result Value Ref Range   Fetal Fibronectin NEGATIVE NEGATIVE    --/--/AB POS (05/13 1234)  Imaging:  No results found.  MAU Course/MDM: I have reviewed the triage vital signs and the nursing notes.   Pertinent  labs & imaging results that were available during my care of the patient were reviewed by me and considered in my medical decision making (see chart for details).      I have reviewed her medical records including past results, notes and treatments.   I have ordered labs and reviewed results. 03-28-2000 showed leukocytes, likely related to UTI, Will treat presumptively and send for culture Procardia x 1 dose given which improved irritability.  FFn resulted as negative which I explained to her was reassuring NST reviewed, reassuring  Treatments in MAU included EFM, Procardia, Rlexeril   Assessment: Single IUP at [redacted]w[redacted]d Low back pain Leukocytes in urine, r/o UTI Uterine irritability with negative Fetal fibronectin  Plan:  Discharge home Rx Flexeril for back pain Rx Duricef for presumed UTI Preterm Labor precautions and fetal kick counts Follow up in Office for prenatal visits and recheck Encouraged to return if she develops worsening of symptoms, increase in pain, fever, or other concerning symptoms.  Pt stable at time of discharge.  Wynelle Bourgeois CNM, MSN Certified Nurse-Midwife 11/21/2021 11:05 PM

## 2021-11-22 DIAGNOSIS — Z3A27 27 weeks gestation of pregnancy: Secondary | ICD-10-CM

## 2021-11-22 DIAGNOSIS — M545 Low back pain, unspecified: Secondary | ICD-10-CM

## 2021-11-22 DIAGNOSIS — N858 Other specified noninflammatory disorders of uterus: Secondary | ICD-10-CM

## 2021-11-22 DIAGNOSIS — O99891 Other specified diseases and conditions complicating pregnancy: Secondary | ICD-10-CM

## 2021-11-22 DIAGNOSIS — M549 Dorsalgia, unspecified: Secondary | ICD-10-CM

## 2021-11-22 LAB — FETAL FIBRONECTIN: Fetal Fibronectin: NEGATIVE

## 2021-11-22 MED ORDER — NIFEDIPINE 10 MG PO CAPS
ORAL_CAPSULE | ORAL | Status: AC
  Filled 2021-11-22: qty 1

## 2021-11-22 MED ORDER — CEFADROXIL 500 MG PO CAPS: 500.0000 mg | ORAL_CAPSULE | Freq: Two times a day (BID) | ORAL | 0 refills | Status: AC

## 2021-11-22 MED ORDER — CYCLOBENZAPRINE HCL 5 MG PO TABS
5.0000 mg | ORAL_TABLET | Freq: Once | ORAL | Status: AC
  Administered 2021-11-22: 5 mg via ORAL
  Filled 2021-11-22: qty 1

## 2021-11-22 MED ORDER — CYCLOBENZAPRINE HCL 5 MG PO TABS
5.0000 mg | ORAL_TABLET | Freq: Three times a day (TID) | ORAL | 0 refills | Status: DC | PRN
Start: 2021-11-22 — End: 2021-11-28

## 2021-11-22 NOTE — Progress Notes (Signed)
Ok to d/c transducer and cont toco per Wynelle Bourgeois CNM

## 2021-11-22 NOTE — Progress Notes (Signed)
PT states her back is hurting more but she cannot tell if it is from ctxs or discomfort from the bed

## 2021-11-22 NOTE — MAU Note (Signed)
Before 2nd Procardia given b/p was 102/65. Wynelle Bourgeois CNM aware and will not give anymore Procardia

## 2021-11-23 LAB — CULTURE, OB URINE: Culture: >=100000 — AB

## 2021-11-27 ENCOUNTER — Other Ambulatory Visit: Payer: Self-pay | Admitting: Women's Health

## 2021-11-27 DIAGNOSIS — O98712 Human immunodeficiency virus [HIV] disease complicating pregnancy, second trimester: Secondary | ICD-10-CM

## 2021-11-28 ENCOUNTER — Ambulatory Visit (INDEPENDENT_AMBULATORY_CARE_PROVIDER_SITE_OTHER): Payer: Medicaid Other | Admitting: Obstetrics & Gynecology

## 2021-11-28 ENCOUNTER — Ambulatory Visit (INDEPENDENT_AMBULATORY_CARE_PROVIDER_SITE_OTHER): Payer: Medicaid Other

## 2021-11-28 ENCOUNTER — Encounter: Payer: Self-pay | Admitting: Obstetrics & Gynecology

## 2021-11-28 ENCOUNTER — Other Ambulatory Visit: Payer: Medicaid Other

## 2021-11-28 VITALS — BP 113/77 | HR 91 | Wt 154.0 lb

## 2021-11-28 DIAGNOSIS — O0992 Supervision of high risk pregnancy, unspecified, second trimester: Secondary | ICD-10-CM

## 2021-11-28 DIAGNOSIS — Z3A28 28 weeks gestation of pregnancy: Secondary | ICD-10-CM

## 2021-11-28 DIAGNOSIS — M549 Dorsalgia, unspecified: Secondary | ICD-10-CM

## 2021-11-28 DIAGNOSIS — B20 Human immunodeficiency virus [HIV] disease: Secondary | ICD-10-CM

## 2021-11-28 DIAGNOSIS — Z3A27 27 weeks gestation of pregnancy: Secondary | ICD-10-CM

## 2021-11-28 DIAGNOSIS — O98712 Human immunodeficiency virus [HIV] disease complicating pregnancy, second trimester: Secondary | ICD-10-CM | POA: Diagnosis not present

## 2021-11-28 DIAGNOSIS — Z131 Encounter for screening for diabetes mellitus: Secondary | ICD-10-CM

## 2021-11-28 DIAGNOSIS — O99891 Other specified diseases and conditions complicating pregnancy: Secondary | ICD-10-CM

## 2021-11-28 MED ORDER — CYCLOBENZAPRINE HCL 5 MG PO TABS: 5.0000 mg | ORAL_TABLET | Freq: Every evening | ORAL | 0 refills | Status: DC | PRN

## 2021-11-28 NOTE — Progress Notes (Signed)
[place] 28 wks,cephalic,anterior placenta gr 0,cx 3.1 cm,AFI 22.7 cm,bilat EICF,FHR 150 bpm,EFW 1206 g 49%

## 2021-11-28 NOTE — Progress Notes (Signed)
HIGH-RISK PREGNANCY VISIT Patient name: Alyssa Sandoval MRN [id number]  Date of birth: 22-Oct-2000 Chief Complaint:   High Risk Gestation, Routine Prenatal Visit (PN2), and Pregnancy Ultrasound  History of Present Illness:   Alyssa Sandoval is a 21 y.o. G2P0010 female at [redacted]w[redacted]d with an Estimated Date of Delivery: 02/20/22 being seen today for ongoing management of a high-risk pregnancy complicated by:  -HIV- followed by ID on Biktarvy -h/o Pyelonephritis-on Duricef daily -Depression/Anxiety- not currently taking Lexapro or Seroquel -HSV  Today she reports  difficulty sleeping.  Will sleep for a few hours then wake up, Flexeril seems to help .   Contractions: Irritability.  Denies vaginal bleeding.   Movement: Present. denies leaking of fluid.      09/18/2021    1:53 PM 08/09/2021    3:55 PM 05/02/2021    8:32 AM 03/15/2021    9:39 AM 10/19/2020    2:11 PM  Depression screen PHQ 2/9  Decreased Interest 0 1 0 0 3  Down, Depressed, Hopeless 0 0 0 1 2  PHQ - 2 Score 0 1 0 1 5  Altered sleeping  1   3  Tired, decreased energy  1   3  Change in appetite  1   2  Feeling bad or failure about yourself   0   2  Trouble concentrating  0   0  Moving slowly or fidgety/restless  0   2  Suicidal thoughts  0   0  PHQ-9 Score  4   17     Current Outpatient Medications  Medication Instructions   aspirin EC 81 mg, Oral, Daily, Swallow whole.   bictegravir-emtricitabine-tenofovir AF (BIKTARVY) 50-200-25 MG TABS tablet 1 tablet, Oral, Daily   cefadroxil (DURICEF) 500 mg, Oral, Every 12 hours   cyclobenzaprine (FLEXERIL) 5 mg, Oral, At bedtime PRN   escitalopram (LEXAPRO) 5 mg, Oral, Daily   ondansetron (ZOFRAN-ODT) 4 mg, Oral, Every 8 hours PRN   Prenatal Vit-Fe Fumarate-FA (PRENATAL MULTIVITAMIN) TABS tablet 1 tablet, Oral, Daily   QUEtiapine (SEROQUEL) 50 mg, Oral, Daily at bedtime     Review of Systems:   Pertinent items are noted in HPI Denies abnormal vaginal discharge w/  itching/odor/irritation, headaches, visual changes, shortness of breath, chest pain, abdominal pain, severe nausea/vomiting, or problems with urination or bowel movements unless otherwise stated above. Pertinent History Reviewed:  Reviewed past medical,surgical, social, obstetrical and family history.  Reviewed problem list, medications and allergies. Physical Assessment:   Vitals:   11/28/21 0944  BP: 113/77  Pulse: 91  Weight: 154 lb (69.9 kg)  Body mass index is 32.19 kg/m.           Physical Examination:   General appearance: alert, well appearing, and in no distress  Mental status: normal mood, behavior, speech, dress, motor activity, and thought processes  Skin: warm & dry   Extremities: Edema: None    Cardiovascular: normal heart rate noted  Respiratory: normal respiratory effort, no distress  Abdomen: gravid, soft, non-tender  Pelvic: Cervical exam deferred         Fetal Status:     Movement: Present    Fetal Surveillance Testing today: - cephalic,anterior placenta gr 0,cx 3.1 cm,AFI 22.7 cm,bilat EICF,FHR 150 bpm,EFW 1206 g 49%   Chaperone: N/A    No results found for this or any previous visit (from the past 24 hour(s)).   Assessment & Plan:  High-risk pregnancy: G2P0010 at [redacted]w[redacted]d with an Estimated Date of Delivery: 02/20/22   1)  HIV -viral load improving, followed by ID -will review labor plan around 32-34wks  2) h/o pyleo- on suppression therapy  3) insomnia due to back pain -Flexeril sparingly  Meds:  Meds ordered this encounter  Medications   cyclobenzaprine (FLEXERIL) 5 MG tablet    Sig: Take 1 tablet (5 mg total) by mouth at bedtime as needed for muscle spasms.    Dispense:  30 tablet    Refill:  0    Labs/procedures today: PN2  Treatment Plan:  as outlined above  Reviewed: Preterm labor symptoms and general obstetric precautions including but not limited to vaginal bleeding, contractions, leaking of fluid and fetal movement were reviewed in  detail with the patient.  All questions were answered. Pt has home bp cuff. Check bp weekly, let us know if >140/90.   Follow-up: Return in about 2 weeks (around 12/12/2021) for HROB visit (Ok for CNM).   Future Appointments  Date Time Provider Department Center  12/14/2021  1:30 PM Myna Hidalgo, DO [facility]  12/19/2021  9:45 AM Calone, Tama Headings, FNP [facility]    Myna Hidalgo, DO Attending Obstetrician & Gynecologist, [facility] for [facility], [facility]

## 2021-11-29 LAB — GLUCOSE TOLERANCE, 2 HOURS W/ 1HR
Glucose, 1 hour: 99 mg/dL (ref 70–179)
Glucose, 2 hour: 116 mg/dL (ref 70–152)
Glucose, Fasting: 84 mg/dL (ref 70–91)

## 2021-11-29 LAB — CBC
Hematocrit: 32.6 % — ABNORMAL LOW (ref 34.0–46.6)
Hemoglobin: 10.6 g/dL — ABNORMAL LOW (ref 11.1–15.9)
MCH: 27.4 pg (ref 26.6–33.0)
MCHC: 32.5 g/dL (ref 31.5–35.7)
MCV: 84 fL (ref 79–97)
Platelets: 323 10*3/uL (ref 150–450)
RBC: 3.87 x10E6/uL (ref 3.77–5.28)
RDW: 12.7 % (ref 11.7–15.4)
WBC: 12.8 10*3/uL — ABNORMAL HIGH (ref 3.4–10.8)

## 2021-11-29 LAB — ANTIBODY SCREEN: Antibody Screen: NEGATIVE

## 2021-11-29 LAB — RPR: RPR Ser Ql: NONREACTIVE

## 2021-12-04 ENCOUNTER — Encounter: Payer: Medicaid Other | Admitting: Nurse Practitioner

## 2021-12-04 NOTE — Progress Notes (Signed)
Because of your wide spread pain and shortness of breath, I feel your condition warrants further evaluation and I recommend that you be seen in a face to face visit.   NOTE: There will be NO CHARGE for this eVisit   If you are having a true medical emergency please call 911.      For an urgent face to face visit, [place] has seven urgent care centers for your convenience:     [facility] Health Ur[facility] Care Center at [facility] Directions [phone] [address] [place], [place] [address]    [facility] Health Urgent Care Center [facility]) Get Driving Directions [phone] [address] [place], [place] [address]  [facility] Health Urgent Care Center [facility]) Get Driving Directions [phone] [address] [place],  [place]  [address]  [facility] Health Urgent Care Center [facility] - at [employer] Directions  [phone] [address] [place],  [place] [address]   [facility] Health Urgent Care at [facility] Get Driving Directions [phone] [address] [place], [place] [address]   [facility] Health Urgent Care at [facility] Get Driving Directions  [phone] [address] [place], [place] [address]   [facility] Health Urgent Care at [facility] Directions [phone] [address] [place], [place] [address]  Your MyChart E-visit questionnaire answers were reviewed by a board certified advanced clinical practitioner to complete your personal care plan based on your specific symptoms.  Thank you for using e-Visits.

## 2021-12-12 ENCOUNTER — Encounter (HOSPITAL_COMMUNITY): Payer: Self-pay | Admitting: Obstetrics and Gynecology

## 2021-12-12 ENCOUNTER — Inpatient Hospital Stay (HOSPITAL_COMMUNITY)
Admission: AD | Admit: 2021-12-12 | Discharge: 2021-12-12 | Disposition: A | Discharge status: Home / Self Care | Payer: Medicaid Other | Attending: Obstetrics and Gynecology | Admitting: Obstetrics and Gynecology

## 2021-12-12 ENCOUNTER — Other Ambulatory Visit: Payer: Self-pay

## 2021-12-12 DIAGNOSIS — O0993 Supervision of high risk pregnancy, unspecified, third trimester: Secondary | ICD-10-CM | POA: Insufficient documentation

## 2021-12-12 DIAGNOSIS — B379 Candidiasis, unspecified: Secondary | ICD-10-CM | POA: Diagnosis not present

## 2021-12-12 DIAGNOSIS — O0992 Supervision of high risk pregnancy, unspecified, second trimester: Secondary | ICD-10-CM

## 2021-12-12 DIAGNOSIS — O98813 Other maternal infectious and parasitic diseases complicating pregnancy, third trimester: Secondary | ICD-10-CM | POA: Diagnosis not present

## 2021-12-12 DIAGNOSIS — Z3A30 30 weeks gestation of pregnancy: Secondary | ICD-10-CM | POA: Insufficient documentation

## 2021-12-12 DIAGNOSIS — L259 Unspecified contact dermatitis, unspecified cause: Secondary | ICD-10-CM

## 2021-12-12 DIAGNOSIS — N898 Other specified noninflammatory disorders of vagina: Secondary | ICD-10-CM | POA: Insufficient documentation

## 2021-12-12 LAB — WET PREP, GENITAL
Clue Cells Wet Prep HPF POC: NONE SEEN
Sperm: NONE SEEN
Trich, Wet Prep: NONE SEEN
WBC, Wet Prep HPF POC: >=10 — AB (ref <10)

## 2021-12-12 LAB — URINALYSIS, ROUTINE W REFLEX MICROSCOPIC
Bacteria, UA: NONE SEEN
Bilirubin Urine: NEGATIVE
Glucose, UA: NEGATIVE mg/dL
Hgb urine dipstick: NEGATIVE
Ketones, ur: NEGATIVE mg/dL
Nitrite: NEGATIVE
Protein, ur: NEGATIVE mg/dL
Specific Gravity, Urine: 1.018 (ref 1.005–1.030)
pH: 8 (ref 5.0–8.0)

## 2021-12-12 MED ORDER — NYSTATIN-TRIAMCINOLONE 100000-0.1 UNIT/GM-% EX CREA: TOPICAL_CREAM | CUTANEOUS | 0 refills | Status: DC

## 2021-12-12 MED ORDER — TERCONAZOLE 0.4 % VA CREA: 1.0000 | TOPICAL_CREAM | Freq: Every day | VAGINAL | 0 refills | Status: DC

## 2021-12-12 NOTE — MAU Provider Note (Signed)
History     CSN: [id number]  Arrival date and time: 12/12/21 0940   Event Date/Time   First Provider Initiated Contact with Patient 12/12/21 1016      Chief Complaint  Patient presents with   Vaginal Itching   Vaginal Irritation   Alyssa Sandoval, a  21 y.o. G2P0010 at [redacted]w[redacted]d presents to MAU with complaints of vaginal irritation, that started about 1 week ago. Patient states that she clipped with the clippers 2 weeks ago then followed up with a razor immediately after 2 weeks ago. Patient states it burns when she urinates and  when she wipes. She rates pain at 6/10 and has attempted neosporin and Cortizone cream for irration without relief. Denies abnormal vaginal discharge. 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 Denies other symptoms.         Vaginal Itching The patient's pertinent negatives include no pelvic pain or vaginal discharge. Associated symptoms include dysuria. Pertinent negatives include no abdominal pain, back pain, chills, constipation, diarrhea, fever, headaches, nausea or vomiting.    OB History     Gravida  2   Para  0   Term  0   Preterm  0   AB  1   Living  0      SAB  1   IAB  0   Ectopic  0   Multiple  0   Live Births  0           Past Medical History:  Diagnosis Date   Chronic constipation 06/10/2012   COVID-19 04/2020   Dysfunctional uterine bleeding    GERD (gastroesophageal reflux disease) 06/10/2012   Headache 12/09/2020   High-risk pregnancy 07/13/2021   HIV (human immunodeficiency virus infection) (HCC)    Low back pain 12/09/2020   Passive suicidal ideations 11/07/2020   Vaginal pruritus 03/15/2021   Vaginal yeast infection     Past Surgical History:  Procedure Laterality Date   NO PAST SURGERIES      Family History  Problem Relation Age of Onset   Obesity Mother    Fibromyalgia Mother    Mental illness Mother    Hypertension Maternal Grandmother    Colon cancer Maternal Grandfather     Social  History   Tobacco Use   Smoking status: Former    Types: Cigars, E-cigarettes   Smokeless tobacco: Never   Tobacco comments:    vapes  Vaping Use   Vaping Use: Never used  Substance Use Topics   Alcohol use: No   Drug use: Not Currently    Types: Marijuana    Allergies: No Known Allergies  Medications Prior to Admission  Medication Sig Dispense Refill Last Dose   aspirin EC 81 MG tablet Take 1 tablet (81 mg total) by mouth daily. Swallow whole. 30 tablet 11 12/11/2021   bictegravir-emtricitabine-tenofovir AF (BIKTARVY) 50-200-25 MG TABS tablet Take 1 tablet by mouth daily. 30 tablet 11 12/11/2021   cyclobenzaprine (FLEXERIL) 5 MG tablet Take 1 tablet (5 mg total) by mouth at bedtime as needed for muscle spasms. 30 tablet 0 Past Week   Prenatal Vit-Fe Fumarate-FA (PRENATAL MULTIVITAMIN) TABS tablet Take 1 tablet by mouth daily at 12 noon.   12/12/2021   escitalopram (LEXAPRO) 5 MG tablet Take 1 tablet (5 mg total) by mouth daily. (Patient not taking: Reported on 07/04/2021) 30 tablet 4 More than a month   ondansetron (ZOFRAN-ODT) 4 MG disintegrating tablet Take 1 tablet (4 mg total) by mouth every 8 (  eight) hours as needed for nausea or vomiting. (Patient not taking: Reported on 10/16/2021) 20 tablet 1 More than a month   QUEtiapine (SEROQUEL) 50 MG tablet Take 1 tablet (50 mg total) by mouth at bedtime. (Patient not taking: Reported on 07/04/2021) 90 tablet 3 More than a month    Review of Systems  Constitutional:  Negative for chills, fatigue and fever.  Eyes:  Negative for pain and visual disturbance.  Respiratory:  Negative for apnea, shortness of breath and wheezing.   Cardiovascular:  Negative for chest pain and palpitations.  Gastrointestinal:  Negative for abdominal pain, constipation, diarrhea, nausea and vomiting.  Genitourinary:  Positive for dysuria, genital sores and vaginal pain. Negative for difficulty urinating, pelvic pain, vaginal bleeding and vaginal discharge.   Musculoskeletal:  Negative for back pain.  Neurological:  Negative for seizures, weakness and headaches.  Psychiatric/Behavioral:  Negative for suicidal ideas.    Physical Exam   Blood pressure 119/83, pulse (!) 113, temperature 98.3 F (36.8 C), temperature source Oral, resp. rate 18, height 4\' 10"  (1.473 m), weight 72.1 kg, SpO2 99 %.  Physical Exam Vitals and nursing note reviewed. Exam conducted with a chaperone present.  Constitutional:      General: She is not in acute distress.    Appearance: Normal appearance.  HENT:     Head: Normocephalic.  Pulmonary:     Effort: Pulmonary effort is normal.  Genitourinary:    Comments: Hair bumps irritation noted on lower labia. 2 non-identical well healing wounds on clitoral hood. Please see images above. Does not appear like HSV outbreak.     White chunky discharge noted on swabs and in the folds of the labia.  Musculoskeletal:     Cervical back: Normal range of motion.  Skin:    General: Skin is warm and dry.  Neurological:     Mental Status: She is alert and oriented to person, place, and time.  Psychiatric:        Mood and Affect: Mood normal.     FHT: 130bpm with moderate variability 15x15 accels with no decels present.  Toco: irritability without UI   MAU Course  Procedures Orders Placed This Encounter  Procedures   Wet prep, genital   Urinalysis, Routine w reflex microscopic Urine, Clean Catch   Results for orders placed or performed during the hospital encounter of 12/12/21 (from the past 24 hour(s))  Wet prep, genital     Status: Abnormal   Collection Time: 12/12/21 10:28 AM   Specimen: PATH Cytology Cervicovaginal Ancillary Only  Result Value Ref Range   Yeast Wet Prep HPF POC PRESENT (A) NONE SEEN   Trich, Wet Prep NONE SEEN NONE SEEN   Clue Cells Wet Prep HPF POC NONE SEEN NONE SEEN   WBC, Wet Prep HPF POC >=10 (A) <10   Sperm NONE SEEN   Urinalysis, Routine w reflex microscopic PATH Cytology  Cervicovaginal Ancillary Only     Status: Abnormal   Collection Time: 12/12/21 10:48 AM  Result Value Ref Range   Color, Urine YELLOW YELLOW   APPearance HAZY (A) CLEAR   Specific Gravity, Urine 1.018 1.005 - 1.030   pH 8.0 5.0 - 8.0   Glucose, UA NEGATIVE NEGATIVE mg/dL   Hgb urine dipstick NEGATIVE NEGATIVE   Bilirubin Urine NEGATIVE NEGATIVE   Ketones, ur NEGATIVE NEGATIVE mg/dL   Protein, ur NEGATIVE NEGATIVE mg/dL   Nitrite NEGATIVE NEGATIVE   Leukocytes,Ua SMALL (A) NEGATIVE   RBC / HPF 0-5 0 -  5 RBC/hpf   WBC, UA 0-5 0 - 5 WBC/hpf   Bacteria, UA NONE SEEN NONE SEEN   Squamous Epithelial / LPF 6-10 0 - 5   Mucus PRESENT      MDM Lab results reviewed and interpreted by me.   UA reflexed to Culture.  Wet Prep Positive for Yeast   - Terazole 7 for tx   - Nystatin cream for clitoral hood healing sores.  GC/CH- PENDING  Assessment and Plan   1. Skin irritation from shaving   2. High-risk pregnancy, second trimester   3. Vaginal irritation   4. [redacted] weeks gestation of pregnancy   5. Yeast infection    - Discussed that this is most likely related to vaginal irritation from shaving/clipping. Also noted that patient has a yeast infection that is contributing to irritation.   - Discussed that if patient has an HSV outbreak she may resume medication and plan to start HSV Prophylaxis at 36 weeks.  - Terazole 7 and Nystatin sent to outpatient pharmacy for treatment of  yeast and irritation.  - FHT appropriate for gestational age at time of discharge.  - Patient stable upon discharge and may return to MAU as needed.   Jacquiline Doe, MSN CNM  12/12/2021, 10:16 AM

## 2021-12-12 NOTE — MAU Note (Signed)
.  Alyssa Sandoval is a 21 y.o. at [redacted]w[redacted]d here in MAU reporting: vaginal irritation and itching.  Reports also has vaginal swelling and soreness with wiping.  Denies abnormal discharge.  States shaved approximately 1 week ago, unsure if irritation is from shaving. Denies VB or LOF.  Endorses +FM. LMP: N/A Onset of complaint: 1 week ago Pain score: 0 Vitals:   12/12/21 0956  BP: 109/73  Pulse: 100  Resp: 19  Temp: 98.3 F (36.8 C)  SpO2: 99%     FHT:148 bpm Lab orders placed from triage:   None

## 2021-12-13 LAB — GC/CHLAMYDIA PROBE AMP (~~LOC~~) NOT AT ARMC
Chlamydia: NEGATIVE
Comment: NEGATIVE
Comment: NORMAL
Neisseria Gonorrhea: NEGATIVE

## 2021-12-14 ENCOUNTER — Encounter: Payer: Self-pay | Admitting: Obstetrics & Gynecology

## 2021-12-14 ENCOUNTER — Ambulatory Visit (INDEPENDENT_AMBULATORY_CARE_PROVIDER_SITE_OTHER): Payer: Medicaid Other | Admitting: Obstetrics & Gynecology

## 2021-12-14 VITALS — BP 117/72 | HR 94 | Wt 158.6 lb

## 2021-12-14 DIAGNOSIS — B20 Human immunodeficiency virus [HIV] disease: Secondary | ICD-10-CM

## 2021-12-14 DIAGNOSIS — O09523 Supervision of elderly multigravida, third trimester: Secondary | ICD-10-CM

## 2021-12-14 NOTE — Progress Notes (Signed)
HIGH-RISK PREGNANCY VISIT Patient name: Alyssa Sandoval MRN [id number]  Date of birth: June 14, 2000 Chief Complaint:   Routine Prenatal Visit  History of Present Illness:   Alyssa Sandoval is a 21 y.o. G2P0010 female at [redacted]w[redacted]d with an Estimated Date of Delivery: 02/20/22 being seen today for ongoing management of a high-risk pregnancy complicated by:  -HIV+  Today she reports  recent MAU visit due to vaginal irritation- being treated for yeast infection .   Contractions: Irritability. Vag. Bleeding: None.  Movement: Present. denies leaking of fluid.      09/18/2021    1:53 PM 08/09/2021    3:55 PM 05/02/2021    8:32 AM 03/15/2021    9:39 AM 10/19/2020    2:11 PM  Depression screen PHQ 2/9  Decreased Interest 0 1 0 0 3  Down, Depressed, Hopeless 0 0 0 1 2  PHQ - 2 Score 0 1 0 1 5  Altered sleeping  1   3  Tired, decreased energy  1   3  Change in appetite  1   2  Feeling bad or failure about yourself   0   2  Trouble concentrating  0   0  Moving slowly or fidgety/restless  0   2  Suicidal thoughts  0   0  PHQ-9 Score  4   17     Current Outpatient Medications  Medication Instructions   aspirin EC 81 mg, Oral, Daily, Swallow whole.   bictegravir-emtricitabine-tenofovir AF (BIKTARVY) 50-200-25 MG TABS tablet 1 tablet, Oral, Daily   cyclobenzaprine (FLEXERIL) 5 mg, Oral, At bedtime PRN   escitalopram (LEXAPRO) 5 mg, Oral, Daily   nystatin-triamcinolone (MYCOLOG II) cream Apply to affected area daily   ondansetron (ZOFRAN-ODT) 4 mg, Oral, Every 8 hours PRN   Prenatal Vit-Fe Fumarate-FA (PRENATAL MULTIVITAMIN) TABS tablet 1 tablet, Oral, Daily   QUEtiapine (SEROQUEL) 50 mg, Oral, Daily at bedtime   terconazole (TERAZOL 7) 0.4 % vaginal cream 1 applicator, Vaginal, Daily at bedtime, Use for seven days     Review of Systems:   Pertinent items are noted in HPI Denies headaches, visual changes, shortness of breath, chest pain, abdominal pain, severe nausea/vomiting, or problems with  urination or bowel movements unless otherwise stated above. Pertinent History Reviewed:  Reviewed past medical,surgical, social, obstetrical and family history.  Reviewed problem list, medications and allergies. Physical Assessment:   Vitals:   12/14/21 0830  BP: 117/72  Pulse: 94  Weight: 158 lb 9.6 oz (71.9 kg)  Body mass index is 33.15 kg/m.           Physical Examination:   General appearance: alert, well appearing, and in no distress  Mental status: normal mood, behavior, speech, dress, motor activity, and thought processes  Skin: warm & dry   Extremities: Edema: None    Cardiovascular: normal heart rate noted  Respiratory: normal respiratory effort, no distress  Abdomen: gravid, soft, non-tender  Pelvic: Cervical exam deferred         Fetal Status: Fetal Heart Rate (bpm): 145 Fundal Height: 30 cm Movement: Present    Fetal Surveillance Testing today: none   Chaperone: N/A    No results found for this or any previous visit (from the past 24 hour(s)).   Assessment & Plan:  High-risk pregnancy: G2P0010 at [redacted]w[redacted]d with an Estimated Date of Delivery: 02/20/22   1) HIV on Biktarvy, followed by ID, next appt 9/12 -growth scan @ 36wks -route of delivery pending viral load  2) Yeast  infection -on treatment, if no improvement, let us know  Reviewed Tdap, interested at next visit  Meds: No orders of the defined types were placed in this encounter.   Labs/procedures today: none  Treatment Plan:  as outlined above  Reviewed: Preterm labor symptoms and general obstetric precautions including but not limited to vaginal bleeding, contractions, leaking of fluid and fetal movement were reviewed in detail with the patient.  All questions were answered. Pt has home bp cuff. Check bp weekly, let us know if >140/90.   Follow-up: Return in about 2 weeks (around 12/28/2021) for HROB visit, growth scan in 6wks.   Future Appointments  Date Time Provider Department Center   12/19/2021  9:45 AM Veryl Speak, FNP [facility]  12/27/2021 10:30 AM Myna Hidalgo, DO [facility]    No orders of the defined types were placed in this encounter.   Myna Hidalgo, DO Attending Obstetrician & Gynecologist, [facility] for [employer], [facility]

## 2021-12-19 ENCOUNTER — Ambulatory Visit (INDEPENDENT_AMBULATORY_CARE_PROVIDER_SITE_OTHER): Payer: Medicaid Other | Admitting: Family

## 2021-12-19 ENCOUNTER — Encounter: Payer: Self-pay | Admitting: Family

## 2021-12-19 ENCOUNTER — Other Ambulatory Visit: Payer: Self-pay

## 2021-12-19 VITALS — BP 108/77 | HR 102 | Temp 98.0°F | Wt 160.0 lb

## 2021-12-19 DIAGNOSIS — B20 Human immunodeficiency virus [HIV] disease: Secondary | ICD-10-CM

## 2021-12-19 DIAGNOSIS — O0992 Supervision of high risk pregnancy, unspecified, second trimester: Secondary | ICD-10-CM | POA: Diagnosis not present

## 2021-12-19 LAB — OB RESULTS CONSOLE HIV ANTIBODY (ROUTINE TESTING): HIV: NONREACTIVE

## 2021-12-19 NOTE — Assessment & Plan Note (Signed)
Alyssa Sandoval continues to have well controlled virus with good adherence and tolerance to Biktarvy. Reviewed previous lab work and discussed plan of care. Discussed current guidelines for HIV and pregnancy and breastfeeding and how it will depend on her viral load. She has follow up with OB next week. Plan for follow up in 3 months or sooner if needed.

## 2021-12-19 NOTE — Progress Notes (Signed)
Brief Narrative   Patient ID: Alyssa Sandoval, female    DOB: [birth year], 21 y.o.   MRN: [id number]  Alyssa Sandoval is a 21 y/o AA female diagnosed with HIV disease with risk factor of heterosexual contact. Initial CD4 count count of 566 with viral load of 3,460. Genotype with no significant medication resistance mutations. No history of opportunistic infection. Alyssa Sandoval negative. Sole ART medication of Biktarvy. Entered care at [facility] Stage 1.   Subjective:    Chief Complaint  Patient presents with   Follow-up    HPI:  Alyssa Sandoval is a 21 y.o. female with HIV disease and currently pregnant last seen on 10/18/2021 with improved adherence and good tolerance to [employer].  Viral load was undetectable with CD4 count of 1071.  Last seen by obstetrics on 12/14/2021 with route of delivery pending viral load. Here today for follow up.  Alyssa Sandoval has been doing well since her last office visit and has been taking her medication as prescribed with no adverse side effects or problems obtaining medications from the pharmacy. Currently [redacted] weeks pregnant and waiting to pick up furniture for the nursery. Did have some vaginal irritation that improved with the use of vasoline and has concerns if it could have been an outbreak of HSV. Denies fevers, chills, night sweats, headaches, changes in vision, neck pain/stiffness, nausea, diarrhea, vomiting, lesions or rashes.    No Known Allergies    Outpatient Medications Prior to Visit  Medication Sig Dispense Refill   aspirin EC 81 MG tablet Take 1 tablet (81 mg total) by mouth daily. Swallow whole. 30 tablet 11   bictegravir-emtricitabine-tenofovir AF (BIKTARVY) 50-200-25 MG TABS tablet Take 1 tablet by mouth daily. 30 tablet 11   Prenatal Vit-Fe Fumarate-FA (PRENATAL MULTIVITAMIN) TABS tablet Take 1 tablet by mouth daily at 12 noon.     terconazole (TERAZOL 7) 0.4 % vaginal cream Place 1 applicator vaginally at bedtime. Use for seven days 45 g 0   cyclobenzaprine  (FLEXERIL) 5 MG tablet Take 1 tablet (5 mg total) by mouth at bedtime as needed for muscle spasms. (Patient not taking: Reported on 12/14/2021) 30 tablet 0   escitalopram (LEXAPRO) 5 MG tablet Take 1 tablet (5 mg total) by mouth daily. (Patient not taking: Reported on 07/04/2021) 30 tablet 4   nystatin-triamcinolone (MYCOLOG II) cream Apply to affected area daily (Patient not taking: Reported on 12/14/2021) 15 g 0   ondansetron (ZOFRAN-ODT) 4 MG disintegrating tablet Take 1 tablet (4 mg total) by mouth every 8 (eight) hours as needed for nausea or vomiting. (Patient not taking: Reported on 10/16/2021) 20 tablet 1   QUEtiapine (SEROQUEL) 50 MG tablet Take 1 tablet (50 mg total) by mouth at bedtime. (Patient not taking: Reported on 07/04/2021) 90 tablet 3   No facility-administered medications prior to visit.     Past Medical History:  Diagnosis Date   Chronic constipation 06/10/2012   COVID-19 04/2020   Dysfunctional uterine bleeding    GERD (gastroesophageal reflux disease) 06/10/2012   Headache 12/09/2020   High-risk pregnancy 07/13/2021   HIV (human immunodeficiency virus infection) (HCC)    Low back pain 12/09/2020   Passive suicidal ideations 11/07/2020   Vaginal pruritus 03/15/2021   Vaginal yeast infection      Past Surgical History:  Procedure Laterality Date   NO PAST SURGERIES        Review of Systems  Constitutional:  Negative for appetite change, chills, diaphoresis, fatigue, fever and unexpected weight change.  Eyes:  Negative for acute change in vision  Respiratory:  Negative for chest tightness, shortness of breath and wheezing.   Cardiovascular:  Negative for chest pain.  Gastrointestinal:  Negative for diarrhea, nausea and vomiting.  Genitourinary:  Negative for dysuria, pelvic pain and vaginal discharge.  Musculoskeletal:  Negative for neck pain and neck stiffness.  Skin:  Negative for rash.  Neurological:  Negative for seizures, syncope, weakness and  headaches.  Hematological:  Negative for adenopathy. Does not bruise/bleed easily.  Psychiatric/Behavioral:  Negative for hallucinations.       Objective:    BP 108/77   Pulse (!) 102   Temp 98 F (36.7 C) (Oral)   Wt 160 lb (72.6 kg)   LMP  (LMP Unknown) Comment: a month ago.  + home pregnancy test on 3/24  BMI 33.44 kg/m  Nursing note and vital signs reviewed.  Physical Exam Constitutional:      General: She is not in acute distress.    Appearance: She is well-developed.  Eyes:     Conjunctiva/sclera: Conjunctivae normal.  Cardiovascular:     Rate and Rhythm: Normal rate and regular rhythm.     Heart sounds: Normal heart sounds. No murmur heard.    No friction rub. No gallop.  Pulmonary:     Effort: Pulmonary effort is normal. No respiratory distress.     Breath sounds: Normal breath sounds. No wheezing or rales.  Chest:     Chest wall: No tenderness.  Abdominal:     General: Bowel sounds are normal.     Palpations: Abdomen is soft.     Tenderness: There is no abdominal tenderness.  Musculoskeletal:     Cervical back: Neck supple.  Lymphadenopathy:     Cervical: No cervical adenopathy.  Skin:    General: Skin is warm and dry.     Findings: No rash.  Neurological:     Mental Status: She is alert and oriented to person, place, and time.  Psychiatric:        Behavior: Behavior normal.        Thought Content: Thought content normal.        Judgment: Judgment normal.         12/19/2021    9:46 AM 09/18/2021    1:53 PM 08/09/2021    3:55 PM 05/02/2021    8:32 AM 03/15/2021    9:39 AM  Depression screen PHQ 2/9  Decreased Interest 0 0 1 0 0  Down, Depressed, Hopeless 0 0 0 0 1  PHQ - 2 Score 0 0 1 0 1  Altered sleeping   1    Tired, decreased energy   1    Change in appetite   1    Feeling bad or failure about yourself    0    Trouble concentrating   0    Moving slowly or fidgety/restless   0    Suicidal thoughts   0    PHQ-9 Score   4         Assessment  & Plan:    Patient Active Problem List   Diagnosis Date Noted   Pyelonephritis 08/19/2021   HSV (herpes simplex virus) anogenital infection 08/09/2021   High-risk pregnancy, second trimester 08/08/2021   Headache 12/09/2020   Low back pain 12/09/2020   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   Passive suicidal ideations 11/07/2020   HIV disease (HCC) 11/01/2020   RLQ abdominal pain 10/21/2020   Irregular menses 10/21/2020     Problem List  Items Addressed This Visit       Other   HIV disease ([place]) - Primary    Ms. Bottenfield continues to have well controlled virus with good adherence and tolerance to [employer]. Reviewed previous lab work and discussed plan of care. Discussed current guidelines for HIV and pregnancy and breastfeeding and how it will depend on her viral load. She has follow up with OB next week. Plan for follow up in 3 months or sooner if needed.       Relevant Orders   HIV-1 RNA quant-no reflex-bld   T-helper cell (CD4)- ([facility] clinic only)   Comprehensive metabolic panel   High-risk pregnancy, second trimester    Continue follow up and management per OB.       Relevant Orders   HIV-1 RNA quant-no reflex-bld   T-helper cell (CD4)- ([facility] clinic only)   Comprehensive metabolic panel     I am having Royetta Car maintain her QUEtiapine, escitalopram, prenatal multivitamin, Biktarvy, aspirin EC, ondansetron, cyclobenzaprine, nystatin-triamcinolone, and terconazole.   Follow-up: Return in about 3 months (around 03/20/2022).   Terri Piedra, MSN, FNP-C Nurse Practitioner [facility] for [facility] [place] number: [phone]

## 2021-12-19 NOTE — Assessment & Plan Note (Signed)
Continue follow up and management per OB.

## 2021-12-19 NOTE — Patient Instructions (Signed)
Nice to see you.  We will check your lab work today.  Continue to take your medication daily as prescribed.  Refills have been sent to the pharmacy.  Plan for follow up in 3 months or sooner if needed with lab work on the same day.  Have a great day and stay safe!  

## 2021-12-20 LAB — T-HELPER CELL (CD4) - (RCID CLINIC ONLY)
CD4 % Helper T Cell: 49 % (ref 33–65)
CD4 T Cell Abs: 877 /uL (ref 400–1790)

## 2021-12-21 LAB — COMPREHENSIVE METABOLIC PANEL
AG Ratio: 1.3 (calc) (ref 1.0–2.5)
ALT: 16 U/L (ref 6–29)
AST: 16 U/L (ref 10–30)
Albumin: 3.6 g/dL (ref 3.6–5.1)
Alkaline phosphatase (APISO): 123 U/L (ref 31–125)
BUN/Creatinine Ratio: 19 (calc) (ref 6–22)
BUN: 8 mg/dL (ref 7–25)
CO2: 23 mmol/L (ref 20–32)
Calcium: 8.7 mg/dL (ref 8.6–10.2)
Chloride: 105 mmol/L (ref 98–110)
Creat: 0.42 mg/dL — ABNORMAL LOW (ref 0.50–0.96)
Globulin: 2.7 g/dL (calc) (ref 1.9–3.7)
Glucose, Bld: 85 mg/dL (ref 65–99)
Potassium: 3.4 mmol/L — ABNORMAL LOW (ref 3.5–5.3)
Sodium: 136 mmol/L (ref 135–146)
Total Bilirubin: 0.2 mg/dL (ref 0.2–1.2)
Total Protein: 6.3 g/dL (ref 6.1–8.1)

## 2021-12-21 LAB — HIV-1 RNA QUANT-NO REFLEX-BLD
HIV 1 RNA Quant: NOT DETECTED Copies/mL
HIV-1 RNA Quant, Log: NOT DETECTED Log cps/mL

## 2021-12-26 ENCOUNTER — Ambulatory Visit (INDEPENDENT_AMBULATORY_CARE_PROVIDER_SITE_OTHER): Payer: Medicaid Other | Admitting: Obstetrics & Gynecology

## 2021-12-26 ENCOUNTER — Telehealth: Payer: Medicaid Other | Admitting: Physician Assistant

## 2021-12-26 ENCOUNTER — Encounter: Payer: Self-pay | Admitting: Obstetrics & Gynecology

## 2021-12-26 ENCOUNTER — Telehealth: Payer: Self-pay | Admitting: *Deleted

## 2021-12-26 VITALS — BP 108/79 | HR 110 | Wt 161.0 lb

## 2021-12-26 DIAGNOSIS — B20 Human immunodeficiency virus [HIV] disease: Secondary | ICD-10-CM

## 2021-12-26 DIAGNOSIS — R109 Unspecified abdominal pain: Secondary | ICD-10-CM

## 2021-12-26 DIAGNOSIS — M549 Dorsalgia, unspecified: Secondary | ICD-10-CM

## 2021-12-26 DIAGNOSIS — O09523 Supervision of elderly multigravida, third trimester: Secondary | ICD-10-CM

## 2021-12-26 MED ORDER — NIFEDIPINE 10 MG PO CAPS: 10.0000 mg | ORAL_CAPSULE | Freq: Three times a day (TID) | ORAL | 0 refills | Status: DC | PRN

## 2021-12-26 NOTE — Telephone Encounter (Signed)
Patient states she is having lower abdominal pain that is radiating to her back and thighs for the past 2 days.  She is uncomfortable with walking and has to "open her legs" when sitting. States she pain is constant when standing but comes and goes when up. Denies leaking or bleeding. Advised patient to come in for evaluation. Pt agreeable to come to office this afternoon.

## 2021-12-26 NOTE — Progress Notes (Signed)
For the safety of you and your child, I recommend a face to face office visit with a health care provider.  Many mothers need to take medicines during their pregnancy and while nursing.  Most are generally considered safe for a mother to take but some medicines must be avoided.  After reviewing your E-Visit request, I recommend that you consult your OB/GYN or pediatrician for medical advice in relation to your condition and prescription medications while pregnant or breastfeeding.  NOTE:  There will be NO CHARGE for this eVisit  If you are having a true medical emergency please call 911.    For an urgent face to face visit, [place] has six urgent care centers for your convenience:     [facility] [place] [facility] [place] Get Driving Directions [phone] [address], [place] [address]    [facility]) Get Driving Directions [phone] [place], [place] [address]  [facility] [place] [facility]) Get Driving Directions [phone] [address] [place],  [place]  [address]  [facility]  Get Driving Directions [phone] [address] [place] [address] [place], [place], [place] [address]   [facility] [place] Get Driving Directions  [phone] [address], [place] [address]   [facility] [place] Get Driving Directions [phone] [address], [place],  [address]  Your MyChart E-visit questionnaire answers were reviewed by a board certified advanced clinical practitioner to complete your personal care plan based on your specific symptoms.  Thank you for using e-Visits.

## 2021-12-26 NOTE — Progress Notes (Signed)
LOW-RISK PREGNANCY VISIT Patient name: Alyssa Sandoval MRN [id number]  Date of birth: Apr 26, 2000 Chief Complaint:   work-in ob (Possible contractions- lower abdomen to back and thighs)  History of Present Illness:   Alyssa Sandoval is a 21 y.o. G2P0010 female at [redacted]w[redacted]d with an Estimated Date of Delivery: 02/20/22 being seen today for ongoing management of a low-risk pregnancy.     12/19/2021    9:46 AM 09/18/2021    1:53 PM 08/09/2021    3:55 PM 05/02/2021    8:32 AM 03/15/2021    9:39 AM  Depression screen PHQ 2/9  Decreased Interest 0 0 1 0 0  Down, Depressed, Hopeless 0 0 0 0 1  PHQ - 2 Score 0 0 1 0 1  Altered sleeping   1    Tired, decreased energy   1    Change in appetite   1    Feeling bad or failure about yourself    0    Trouble concentrating   0    Moving slowly or fidgety/restless   0    Suicidal thoughts   0    PHQ-9 Score   4      Today she reports no complaints. Contractions: Not present.  .  Movement: Present. denies leaking of fluid. Review of Systems:   Pertinent items are noted in HPI Denies abnormal vaginal discharge w/ itching/odor/irritation, headaches, visual changes, shortness of breath, chest pain, abdominal pain, severe nausea/vomiting, or problems with urination or bowel movements unless otherwise stated above. Pertinent History Reviewed:  Reviewed past medical,surgical, social, obstetrical and family history.  Reviewed problem list, medications and allergies. Physical Assessment:   Vitals:   12/26/21 1534  BP: 108/79  Pulse: (!) 110  Weight: 161 lb (73 kg)  Body mass index is 33.65 kg/m.        Physical Examination:   General appearance: Well appearing, and in no distress  Mental status: Alert, oriented to person, place, and time  Skin: Warm & dry  Cardiovascular: Normal heart rate noted  Respiratory: Normal respiratory effort, no distress  Abdomen: Soft, gravid, nontender  Pelvic: Cervical exam performed         Extremities: Edema: Trace  Fetal  Status:     Movement: Present    Chaperone: n/a    No results found for this or any previous visit (from the past 24 hour(s)).  Assessment & Plan:  1) Low-risk pregnancy G2P0010 at [redacted]w[redacted]d with an Estimated Date of Delivery: 02/20/22   2) Preterm contractions, no cervical change LTC, procardia prn contractions plus decrease activity, fluids and warm bath,    Meds:  Meds ordered this encounter  Medications   NIFEdipine (PROCARDIA) 10 MG capsule    Sig: Take 1 capsule (10 mg total) by mouth 3 (three) times daily as needed (for regular painful contractions).    Dispense:  30 capsule    Refill:  0   Labs/procedures today: NST  Alyssa Sandoval is at [redacted]w[redacted]d Estimated Date of Delivery: 02/20/22  NST being performed due to contractions  Today the NST is Reactive  Fetal Monitoring:  Baseline: 140 bpm, Variability: Good {> 6 bpm), Accelerations: Reactive, and Decelerations: Absent   reactive  Uterine irritability, every 2-3 minutes  The accelerations are >15 bpm and more than 2 in 20 minutes  Final diagnosis:  Reactive NST  Alyssa Arms, MD    Plan:  Continue routine obstetrical care  Next visit: prefers in person    Reviewed: Preterm labor  symptoms and general obstetric precautions including but not limited to vaginal bleeding, contractions, leaking of fluid and fetal movement were reviewed in detail with the patient.  All questions were answered. Has home bp cuff. Rx faxed to . Check bp weekly, let us know if >140/90.   Follow-up: Return in about 2 weeks (around 01/09/2022) for [place].  No orders of the defined types were placed in this encounter.   Florian Buff, MD 12/26/2021 4:22 PM

## 2021-12-27 ENCOUNTER — Encounter: Payer: Self-pay | Admitting: Obstetrics & Gynecology

## 2021-12-27 ENCOUNTER — Encounter: Payer: Medicaid Other | Admitting: Obstetrics & Gynecology

## 2021-12-28 ENCOUNTER — Inpatient Hospital Stay (HOSPITAL_COMMUNITY)
Admission: AD | Admit: 2021-12-28 | Discharge: 2021-12-28 | Disposition: A | Discharge status: Home / Self Care | Payer: Medicaid Other | Attending: Obstetrics and Gynecology | Admitting: Obstetrics and Gynecology

## 2021-12-28 ENCOUNTER — Encounter (HOSPITAL_COMMUNITY): Payer: Self-pay | Admitting: Obstetrics and Gynecology

## 2021-12-28 DIAGNOSIS — O99613 Diseases of the digestive system complicating pregnancy, third trimester: Secondary | ICD-10-CM | POA: Diagnosis present

## 2021-12-28 DIAGNOSIS — O4700 False labor before 37 completed weeks of gestation, unspecified trimester: Secondary | ICD-10-CM | POA: Diagnosis not present

## 2021-12-28 DIAGNOSIS — Z3A32 32 weeks gestation of pregnancy: Secondary | ICD-10-CM | POA: Diagnosis not present

## 2021-12-28 DIAGNOSIS — O4703 False labor before 37 completed weeks of gestation, third trimester: Secondary | ICD-10-CM | POA: Insufficient documentation

## 2021-12-28 DIAGNOSIS — O0992 Supervision of high risk pregnancy, unspecified, second trimester: Secondary | ICD-10-CM

## 2021-12-28 LAB — URINALYSIS, ROUTINE W REFLEX MICROSCOPIC
Bilirubin Urine: NEGATIVE
Glucose, UA: NEGATIVE mg/dL
Hgb urine dipstick: NEGATIVE
Ketones, ur: 5 mg/dL — AB
Leukocytes,Ua: NEGATIVE
Nitrite: NEGATIVE
Protein, ur: NEGATIVE mg/dL
Specific Gravity, Urine: 1.019 (ref 1.005–1.030)
pH: 6 (ref 5.0–8.0)

## 2021-12-28 MED ORDER — NIFEDIPINE 10 MG PO CAPS
10.0000 mg | ORAL_CAPSULE | ORAL | Status: AC
  Administered 2021-12-28 (×2): 10 mg via ORAL
  Filled 2021-12-28 (×2): qty 1

## 2021-12-28 MED ORDER — ACETAMINOPHEN-CAFFEINE 500-65 MG PO TABS
2.0000 | ORAL_TABLET | Freq: Once | ORAL | Status: AC
  Administered 2021-12-28: 2 via ORAL
  Filled 2021-12-28: qty 2

## 2021-12-28 MED ORDER — LACTATED RINGERS IV BOLUS
1000.0000 mL | Freq: Once | INTRAVENOUS | Status: AC
  Administered 2021-12-28: 1000 mL via INTRAVENOUS

## 2021-12-28 NOTE — MAU Provider Note (Signed)
History     CSN: [id number]  Arrival date and time: 12/28/21 1540   Event Date/Time   First Provider Initiated Contact with Patient 12/28/21 1627      Chief Complaint  Patient presents with   Contractions   [name] is a 21 y.o. year old G53P0010 female at [redacted]w[redacted]d weeks gestation who presents to MAU reporting contractions for 4 days. She reports the contractions are every 2 minutes today. She was seen on Tuesday 12/26/2021. She was Rx'd Procardia 10 mg TID. She reports she "took them about every 3 hours last night," but only took 1 dose this morning. She notified her OB office yesterday about the contractions were so bad that she couldn't sleep last night. She denies VB or LOF. She receives [facility] with [facility]; next appt is 01/10/2022. The FOB is present and contributing to the history taking.    OB History     Gravida  2   Para  0   Term  0   Preterm  0   AB  1   Living  0      SAB  1   IAB  0   Ectopic  0   Multiple  0   Live Births  0           Past Medical History:  Diagnosis Date   Chronic constipation 06/10/2012   COVID-19 04/2020   Dysfunctional uterine bleeding    GERD (gastroesophageal reflux disease) 06/10/2012   Headache 12/09/2020   High-risk pregnancy 07/13/2021   HIV (human immunodeficiency virus infection) ([place])    Low back pain 12/09/2020   Passive suicidal ideations 11/07/2020   Vaginal pruritus 03/15/2021   Vaginal yeast infection     Past Surgical History:  Procedure Laterality Date   NO PAST SURGERIES      Family History  Problem Relation Age of Onset   Obesity Mother    Fibromyalgia Mother    Mental illness Mother    Hypertension Maternal Grandmother    Colon cancer Maternal Grandfather     Social History   Tobacco Use   Smoking status: Former    Types: Cigars, E-cigarettes   Smokeless tobacco: Never   Tobacco comments:    vapes  Vaping Use   Vaping Use: Never used  Substance Use Topics   Alcohol use: No    Drug use: Not Currently    Types: Marijuana    Allergies: No Known Allergies  No medications prior to admission.    Review of Systems  Constitutional: Negative.   HENT: Negative.    Eyes: Negative.   Respiratory: Negative.    Cardiovascular: Negative.   Gastrointestinal: Negative.   Endocrine: Negative.   Genitourinary:  Positive for pelvic pain (contractions every 2 mins; not responding to Procardia).  Musculoskeletal: Negative.   Skin: Negative.   Allergic/Immunologic: Negative.   Neurological: Negative.   Hematological: Negative.   Psychiatric/Behavioral: Negative.     Physical Exam   Blood pressure 123/73, pulse 92, temperature 98.4 F (36.9 C), temperature source Oral, resp. rate 16, height 4\' 10"  (1.473 m), weight 75.8 kg, SpO2 100 %.  Physical Exam Vitals and nursing note reviewed. Exam conducted with a chaperone present.  Cardiovascular:     Rate and Rhythm: Tachycardia present.  Pulmonary:     Effort: Pulmonary effort is normal.  Abdominal:     Palpations: Abdomen is soft.  Genitourinary:    General: Normal vulva.     Comments: Dilation: Closed Effacement (%):  Thick Exam by:: R. Jahdai Padovano,CNM  Musculoskeletal:        General: Normal range of motion.  Skin:    General: Skin is warm and dry.  Neurological:     Mental Status: She is oriented to person, place, and time.  Psychiatric:        Mood and Affect: Mood normal.        Behavior: Behavior normal.        Thought Content: Thought content normal.        Judgment: Judgment normal.    REACTIVE NST - FHR: 145 bpm / moderate variability / accels present / decels absent / TOCO: regular every 2-7 mins >> UI noted after Procardia dosing MAU Course  Procedures  MDM CCUA CEFM SVE LR bolus 1000 ml @ 999 ml/hr Procardia 10 mg every 20 mins x 2 doses (3rd dose held) Excedrin Tension 2 tablets -- relieved H/A  Results for orders placed or performed during the hospital encounter of 12/28/21 (from the  past 24 hour(s))  Urinalysis, Routine w reflex microscopic Urine, Clean Catch     Status: Abnormal   Collection Time: 12/28/21  4:50 PM  Result Value Ref Range   Color, Urine YELLOW YELLOW   APPearance HAZY (A) CLEAR   Specific Gravity, Urine 1.019 1.005 - 1.030   pH 6.0 5.0 - 8.0   Glucose, UA NEGATIVE NEGATIVE mg/dL   Hgb urine dipstick NEGATIVE NEGATIVE   Bilirubin Urine NEGATIVE NEGATIVE   Ketones, ur 5 (A) NEGATIVE mg/dL   Protein, ur NEGATIVE NEGATIVE mg/dL   Nitrite NEGATIVE NEGATIVE   Leukocytes,Ua NEGATIVE NEGATIVE    Assessment and Plan  1. Preterm uterine contractions - Information provided on PTL and preventing preterm birth - 2/3-1-1 Rule: Go to MAU for painful contractions every 2-3 minutes, lasting 1 minute each for 1.5 hours.    2. weeks gestation of pregnancy   - Discharge patient - Keep scheduled appt at [facility] on 01/10/2022 - Patient verbalized an understanding of the plan of care and agrees.   Laury Deep, CNM 12/28/2021, 4:27 PM

## 2021-12-28 NOTE — MAU Note (Signed)
Pt complains of a headache, last dose of procardia due w/ uterine irritability on EFM. Pt reports she is feeling less contractions. Same reported to provider, will not give last dose of procardia and will order something for the headache

## 2021-12-28 NOTE — Discharge Instructions (Addendum)
2/3-1-1 Rule Go to MAU for painful contractions every 2-3 minutes, lasting 1 minute each for 1.5 hours. Take your Procardia every 4-6 hours for contractions.

## 2021-12-28 NOTE — MAU Note (Signed)
Kelsei Defino is a 21 y.o. at [redacted]w[redacted]d here in MAU reporting: has been contracting the past 4 days. Was given medication for PTL, took it this morning, only taken one dose.  Was told to come in get some fluids.  Ctxs coming every couple of minutes No bleeding or LOF. Reports +FM Onset of complaint: 4 days ago Pain score: 9  Vitals:   12/28/21 1556  BP: (!) 109/53  Pulse: (!) 104  Resp: 18  Temp: 98.4 F (36.9 C)  SpO2: 99%     FHT:154 Lab orders placed from triage:  urine

## 2022-01-09 ENCOUNTER — Encounter: Payer: Medicaid Other | Admitting: Women's Health

## 2022-01-10 ENCOUNTER — Encounter: Payer: Self-pay | Admitting: Advanced Practice Midwife

## 2022-01-10 ENCOUNTER — Ambulatory Visit (INDEPENDENT_AMBULATORY_CARE_PROVIDER_SITE_OTHER): Payer: Medicaid Other | Admitting: Advanced Practice Midwife

## 2022-01-10 VITALS — BP 113/77 | HR 85 | Wt 166.0 lb

## 2022-01-10 DIAGNOSIS — A609 Anogenital herpesviral infection, unspecified: Secondary | ICD-10-CM

## 2022-01-10 DIAGNOSIS — O0992 Supervision of high risk pregnancy, unspecified, second trimester: Secondary | ICD-10-CM

## 2022-01-10 DIAGNOSIS — Z3A34 34 weeks gestation of pregnancy: Secondary | ICD-10-CM

## 2022-01-10 DIAGNOSIS — B20 Human immunodeficiency virus [HIV] disease: Secondary | ICD-10-CM

## 2022-01-10 MED ORDER — ACYCLOVIR 400 MG PO TABS: 400.0000 mg | ORAL_TABLET | Freq: Three times a day (TID) | ORAL | 3 refills | Status: DC

## 2022-01-10 NOTE — Progress Notes (Signed)
HIGH-RISK PREGNANCY VISIT Patient name: Purpose Barca MRN [id number]  Date of birth: Feb 04, 2001 Chief Complaint:   Routine Prenatal Visit  History of Present Illness:   Alyssa Sandoval is a 21 y.o. G2P0010 female at [redacted]w[redacted]d with an Estimated Date of Delivery: 02/20/22 being seen today for ongoing management of a high-risk pregnancy complicated by HIV on Biktarvy.    Today she reports no complaints. Contractions: Irritability. Vag. Bleeding: None.  Movement: Present. denies leaking of fluid.      12/19/2021    9:46 AM 09/18/2021    1:53 PM 08/09/2021    3:55 PM 05/02/2021    8:32 AM 03/15/2021    9:39 AM  Depression screen PHQ 2/9  Decreased Interest 0 0 1 0 0  Down, Depressed, Hopeless 0 0 0 0 1  PHQ - 2 Score 0 0 1 0 1  Altered sleeping   1    Tired, decreased energy   1    Change in appetite   1    Feeling bad or failure about yourself    0    Trouble concentrating   0    Moving slowly or fidgety/restless   0    Suicidal thoughts   0    PHQ-9 Score   4          08/09/2021    3:56 PM 10/19/2020    2:11 PM  GAD 7 : Generalized Anxiety Score  Nervous, Anxious, on Edge 1 3  Control/stop worrying 0 3  Worry too much - different things 1 3  Trouble relaxing 0 3  Restless 0 0  Easily annoyed or irritable 0 3  Afraid - awful might happen 0 3  Total GAD 7 Score 2 18     Review of Systems:   Pertinent items are noted in HPI Denies abnormal vaginal discharge w/ itching/odor/irritation, headaches, visual changes, shortness of breath, chest pain, abdominal pain, severe nausea/vomiting, or problems with urination or bowel movements unless otherwise stated above. Pertinent History Reviewed:  Reviewed past medical,surgical, social, obstetrical and family history.  Reviewed problem list, medications and allergies. Physical Assessment:   Vitals:   01/10/22 0959  BP: 113/77  Pulse: 85  Weight: 166 lb (75.3 kg)  Body mass index is 34.69 kg/m.           Physical Examination:   General  appearance: alert, well appearing, and in no distress  Mental status: alert, oriented to person, place, and time  Skin: warm & dry   Extremities: Edema: Trace    Cardiovascular: normal heart rate noted  Respiratory: normal respiratory effort, no distress  Abdomen: gravid, soft, non-tender  Pelvic: Cervical exam deferred         Fetal Status: Fetal Heart Rate (bpm): 142 Fundal Height: 34 cm Movement: Present    Fetal Surveillance Testing today: none    No results found for this or any previous visit (from the past 24 hour(s)).  Assessment & Plan:  High-risk pregnancy: G2P0010 at [redacted]w[redacted]d with an Estimated Date of Delivery: 02/20/22   1) HIV+, on Biktarvy; viral load 9/12> not detected   2) Hx HSV, rx acyclovir sent in to start taking for suppression  Meds:  Meds ordered this encounter  Medications   acyclovir (ZOVIRAX) 400 MG tablet    Sig: Take 1 tablet (400 mg total) by mouth 3 (three) times daily.    Dispense:  90 tablet    Refill:  3    Labs/procedures today: none  Treatment  Plan:  growth 36wks  Reviewed: Preterm labor symptoms and general obstetric precautions including but not limited to vaginal bleeding, contractions, leaking of fluid and fetal movement were reviewed in detail with the patient.  All questions were answered. Does have home bp cuff. Office bp cuff given: not applicable. Check bp weekly, let us know if consistently >140 and/or >90.  Follow-up: Return for As scheduled.(Visit, growth, GBS/cultures)   Future Appointments  Date Time Provider [place]  01/24/2022  8:30 AM [facility] [place] [facility] None  01/24/2022  9:30 AM Myrtis Ser, CNM [facility]  03/15/2022  9:45 AM Elna Breslow Ples Specter, FNP [facility]    No orders of the defined types were placed in this encounter.  Myrtis Ser CNM 01/10/2022 10:27 AM

## 2022-01-10 NOTE — Patient Instructions (Signed)
Alyssa Sandoval, thank you for choosing our office today! We appreciate the opportunity to meet your healthcare needs. You may receive a short survey by mail, e-mail, or through MyChart. If you are happy with your care we would appreciate if you could take just a few minutes to complete the survey questions. We read all of your comments and take your feedback very seriously. Thank you again for choosing our office.  [facility] [place] [facility] ([address] [place], [place] [address]) Entrance C, located off of [address] Free 24/7 valet parking   CLASSES: Go to [email or web address] to register for classes (childbirth, breastfeeding, waterbirth, infant CPR, daddy bootcamp, etc.)  Call the office ([phone]) or go to [facility] if: You begin to have strong, frequent contractions Your water breaks.  Sometimes it is a big gush of fluid, sometimes it is just a trickle that keeps getting your panties wet or running down your legs You have vaginal bleeding.  It is normal to have a small amount of spotting if your cervix was checked.  You don't feel your baby moving like normal.  If you don't, get you something to eat and drink and lay down and focus on feeling your baby move.   If your baby is still not moving like normal, you should call the office or go to [facility].  Call the office ([phone]) or go to [facility] for these signs of pre-eclampsia: Severe headache that does not go away with Tylenol Visual changes- seeing spots, double, blurred vision Pain under your right breast or upper abdomen that does not go away with Tums or heartburn medicine Nausea and/or vomiting Severe swelling in your hands, feet, and face   Tdap Vaccine It is recommended that you get the Tdap vaccine during the third trimester of EACH pregnancy to help protect your baby from getting pertussis (whooping cough) 27-36 weeks is the BEST time to do  this so that you can pass the protection on to your baby. During pregnancy is better than after pregnancy, but if you are unable to get it during pregnancy it will be offered at the hospital.  You can get this vaccine with us, at the health department, your family doctor, or some local pharmacies Everyone who will be around your baby should also be up-to-date on their vaccines before the baby comes. Adults (who are not pregnant) only need 1 dose of Tdap during adulthood.   [place] Pediatricians/Family Doctors [facility] ([facility]): [address], [phone]           [facility]: [address], [phone]                [facility] ([facility]): [address], [phone] (call to ask if accepting patients) [facility]: [address], [place], [phone]    [place] Pediatricians/Family Doctors [facility] ([facility]): [address], [phone] [facility]: [address], [phone] [facility]: [address], [phone]  [place] [facility]  [facility] ([facility]): [phone] [facility]: [address], [phone]   [place] Family Doctors [facility]: [address], [phone]  [place] [facility]  [facility]: [address], [phone]  Home Blood Pressure Monitoring for Patients   Your provider has recommended that you check your   blood pressure (BP) at least once a week at home. If you do not have a blood pressure cuff at home, one will be provided for you. Contact your provider if you have not received your monitor within 1 week.   Helpful Tips for Accurate Home Blood Pressure Checks  Don't smoke, exercise, or drink caffeine 30 minutes before checking your BP Use the restroom before checking your BP (a full bladder can raise your  pressure) Relax in a comfortable upright chair Feet on the ground Left arm resting comfortably on a flat surface at the level of your heart Legs uncrossed Back supported Sit quietly and don't talk Place the cuff on your bare arm Adjust snuggly, so that only two fingertips can fit between your skin and the top of the cuff Check 2 readings separated by at least one minute Keep a log of your BP readings For a visual, please reference this diagram: http://ccnc.care/bpdiagram  Provider Name: [facility]     Phone: [phone]  Zone 1: ALL CLEAR  Continue to monitor your symptoms:  BP reading is less than 140 (top number) or less than 90 (bottom number)  No right upper stomach pain No headaches or seeing spots No feeling nauseated or throwing up No swelling in face and hands  Zone 2: CAUTION Call your doctor's office for any of the following:  BP reading is greater than 140 (top number) or greater than 90 (bottom number)  Stomach pain under your ribs in the middle or right side Headaches or seeing spots Feeling nauseated or throwing up Swelling in face and hands  Zone 3: EMERGENCY  Seek immediate medical care if you have any of the following:  BP reading is greater than160 (top number) or greater than 110 (bottom number) Severe headaches not improving with Tylenol Serious difficulty catching your breath Any worsening symptoms from Zone 2   Third Trimester of Pregnancy The third trimester is from week 29 through week 42, months 7 through 9. The third trimester is a time when the fetus is growing rapidly. At the end of the ninth month, the fetus is about 20 inches in length and weighs 6-10 pounds.  BODY CHANGES Your body goes through many changes during pregnancy. The changes vary from woman to woman.  Your weight will continue to increase. You can expect to gain 25-35 pounds (11-16 kg) by the end of the pregnancy. You may begin to get stretch marks on your hips, abdomen,  and breasts. You may urinate more often because the fetus is moving lower into your pelvis and pressing on your bladder. You may develop or continue to have heartburn as a result of your pregnancy. You may develop constipation because certain hormones are causing the muscles that push waste through your intestines to slow down. You may develop hemorrhoids or swollen, bulging veins (varicose veins). You may have pelvic pain because of the weight gain and pregnancy hormones relaxing your joints between the bones in your pelvis. Backaches may result from overexertion of the muscles supporting your posture. You may have changes in your hair. These can include thickening of your hair, rapid growth, and changes in texture. Some women also have hair loss during or after pregnancy, or hair that feels dry or thin. Your hair will most likely return to normal after your baby is born. Your breasts will continue to grow and be tender. A yellow discharge may leak from your breasts called colostrum. Your belly button may stick out. You may   feel short of breath because of your expanding uterus. You may notice the fetus "dropping," or moving lower in your abdomen. You may have a bloody mucus discharge. This usually occurs a few days to a week before labor begins. Your cervix becomes thin and soft (effaced) near your due date. WHAT TO EXPECT AT YOUR PRENATAL EXAMS  You will have prenatal exams every 2 weeks until week 36. Then, you will have weekly prenatal exams. During a routine prenatal visit: You will be weighed to make sure you and the fetus are growing normally. Your blood pressure is taken. Your abdomen will be measured to track your baby's growth. The fetal heartbeat will be listened to. Any test results from the previous visit will be discussed. You may have a cervical check near your due date to see if you have effaced. At around 36 weeks, your caregiver will check your cervix. At the same time, your  caregiver will also perform a test on the secretions of the vaginal tissue. This test is to determine if a type of bacteria, Group B streptococcus, is present. Your caregiver will explain this further. Your caregiver may ask you: What your birth plan is. How you are feeling. If you are feeling the baby move. If you have had any abnormal symptoms, such as leaking fluid, bleeding, severe headaches, or abdominal cramping. If you have any questions. Other tests or screenings that may be performed during your third trimester include: Blood tests that check for low iron levels (anemia). Fetal testing to check the health, activity level, and growth of the fetus. Testing is done if you have certain medical conditions or if there are problems during the pregnancy. FALSE LABOR You may feel small, irregular contractions that eventually go away. These are called Braxton Hicks contractions, or false labor. Contractions may last for hours, days, or even weeks before true labor sets in. If contractions come at regular intervals, intensify, or become painful, it is best to be seen by your caregiver.  SIGNS OF LABOR  Menstrual-like cramps. Contractions that are 5 minutes apart or less. Contractions that start on the top of the uterus and spread down to the lower abdomen and back. A sense of increased pelvic pressure or back pain. A watery or bloody mucus discharge that comes from the vagina. If you have any of these signs before the 37th week of pregnancy, call your caregiver right away. You need to go to the hospital to get checked immediately. HOME CARE INSTRUCTIONS  Avoid all smoking, herbs, alcohol, and unprescribed drugs. These chemicals affect the formation and growth of the baby. Follow your caregiver's instructions regarding medicine use. There are medicines that are either safe or unsafe to take during pregnancy. Exercise only as directed by your caregiver. Experiencing uterine cramps is a good sign to  stop exercising. Continue to eat regular, healthy meals. Wear a good support bra for breast tenderness. Do not use hot tubs, steam rooms, or saunas. Wear your seat belt at all times when driving. Avoid raw meat, uncooked cheese, cat litter boxes, and soil used by cats. These carry germs that can cause birth defects in the baby. Take your prenatal vitamins. Try taking a stool softener (if your caregiver approves) if you develop constipation. Eat more high-fiber foods, such as fresh vegetables or fruit and whole grains. Drink plenty of fluids to keep your urine clear or pale yellow. Take warm sitz baths to soothe any pain or discomfort caused by hemorrhoids. Use hemorrhoid cream if   your caregiver approves. If you develop varicose veins, wear support hose. Elevate your feet for 15 minutes, 3-4 times a day. Limit salt in your diet. Avoid heavy lifting, wear low heal shoes, and practice good posture. Rest a lot with your legs elevated if you have leg cramps or low back pain. Visit your dentist if you have not gone during your pregnancy. Use a soft toothbrush to brush your teeth and be gentle when you floss. A sexual relationship may be continued unless your caregiver directs you otherwise. Do not travel far distances unless it is absolutely necessary and only with the approval of your caregiver. Take prenatal classes to understand, practice, and ask questions about the labor and delivery. Make a trial run to the hospital. Pack your hospital bag. Prepare the baby's nursery. Continue to go to all your prenatal visits as directed by your caregiver. SEEK MEDICAL CARE IF: You are unsure if you are in labor or if your water has broken. You have dizziness. You have mild pelvic cramps, pelvic pressure, or nagging pain in your abdominal area. You have persistent nausea, vomiting, or diarrhea. You have a bad smelling vaginal discharge. You have pain with urination. SEEK IMMEDIATE MEDICAL CARE IF:  You  have a fever. You are leaking fluid from your vagina. You have spotting or bleeding from your vagina. You have severe abdominal cramping or pain. You have rapid weight loss or gain. You have shortness of breath with chest pain. You notice sudden or extreme swelling of your face, hands, ankles, feet, or legs. You have not felt your baby move in over an hour. You have severe headaches that do not go away with medicine. You have vision changes. Document Released: 03/20/2001 Document Revised: 03/31/2013 Document Reviewed: 05/27/2012 [facility] Patient Information 2015 [place], [place]. This information is not intended to replace advice given to you by your health care provider. Make sure you discuss any questions you have with your health care provider.

## 2022-01-12 ENCOUNTER — Encounter: Payer: Self-pay | Admitting: Obstetrics & Gynecology

## 2022-01-13 ENCOUNTER — Inpatient Hospital Stay (HOSPITAL_COMMUNITY)
Admission: AD | Admit: 2022-01-13 | Discharge: 2022-01-13 | Disposition: A | Discharge status: Home / Self Care | Payer: Medicaid Other | Attending: Obstetrics and Gynecology | Admitting: Obstetrics and Gynecology

## 2022-01-13 ENCOUNTER — Encounter (HOSPITAL_COMMUNITY): Payer: Self-pay | Admitting: Obstetrics and Gynecology

## 2022-01-13 DIAGNOSIS — Z3A34 34 weeks gestation of pregnancy: Secondary | ICD-10-CM | POA: Insufficient documentation

## 2022-01-13 DIAGNOSIS — O26893 Other specified pregnancy related conditions, third trimester: Secondary | ICD-10-CM | POA: Diagnosis present

## 2022-01-13 DIAGNOSIS — Z79899 Other long term (current) drug therapy: Secondary | ICD-10-CM | POA: Diagnosis not present

## 2022-01-13 DIAGNOSIS — O4703 False labor before 37 completed weeks of gestation, third trimester: Secondary | ICD-10-CM | POA: Insufficient documentation

## 2022-01-13 DIAGNOSIS — Z3689 Encounter for other specified antenatal screening: Secondary | ICD-10-CM

## 2022-01-13 LAB — WET PREP, GENITAL
Clue Cells Wet Prep HPF POC: NONE SEEN
Sperm: NONE SEEN
Trich, Wet Prep: NONE SEEN
WBC, Wet Prep HPF POC: <10 (ref <10)
Yeast Wet Prep HPF POC: NONE SEEN

## 2022-01-13 MED ORDER — MORPHINE SULFATE (PF) 4 MG/ML IV SOLN
4.0000 mg | Freq: Once | INTRAVENOUS | Status: AC
  Administered 2022-01-13: 4 mg via INTRAVENOUS
  Filled 2022-01-13: qty 1

## 2022-01-13 MED ORDER — LACTATED RINGERS IV BOLUS
1000.0000 mL | Freq: Once | INTRAVENOUS | Status: AC
  Administered 2022-01-13: 1000 mL via INTRAVENOUS

## 2022-01-13 NOTE — MAU Provider Note (Signed)
Event Date/Time   First Provider Initiated Contact with Patient 01/13/22 0102       History     CSN: [id number]  Arrival date and time: 01/13/22 0048   Event Date/Time   First Provider Initiated Contact with Patient 01/13/22 0102      Chief Complaint  Patient presents with   Contractions   Alyssa Sandoval is a 21 y.o. G2P0010 at [redacted]w[redacted]d who receives care at [facility].  She presents today for Contractions.  Patient states contractions started today, but "earlier today they got more intense and closer together."  She states she was given Procardia last month and took 3 doses today with her last dose around 1230.  Patient states the contractions are located in lower abdominal area and that when her "belly gets so hard" it is difficult to feel fetal movement.  She reports sexual activity on Wednesday and denies vaginal bleeding, discharge, or LOF.   OB History     Gravida  2   Para  0   Term  0   Preterm  0   AB  1   Living  0      SAB  1   IAB  0   Ectopic  0   Multiple  0   Live Births  0           Past Medical History:  Diagnosis Date   Chronic constipation 06/10/2012   COVID-19 04/2020   Dysfunctional uterine bleeding    GERD (gastroesophageal reflux disease) 06/10/2012   Headache 12/09/2020   High-risk pregnancy 07/13/2021   HIV (human immunodeficiency virus infection) ([place])    Low back pain 12/09/2020   Passive suicidal ideations 11/07/2020   Vaginal pruritus 03/15/2021   Vaginal yeast infection     Past Surgical History:  Procedure Laterality Date   NO PAST SURGERIES      Family History  Problem Relation Age of Onset   Obesity Mother    Fibromyalgia Mother    Mental illness Mother    Hypertension Maternal Grandmother    Colon cancer Maternal Grandfather     Social History   Tobacco Use   Smoking status: Former    Types: Landscape architect, E-cigarettes   Smokeless tobacco: Never   Tobacco comments:    vapes  Vaping Use   Vaping Use: Never  used  Substance Use Topics   Alcohol use: No   Drug use: Not Currently    Types: Marijuana    Allergies: No Known Allergies  Medications Prior to Admission  Medication Sig Dispense Refill Last Dose   NIFEdipine (PROCARDIA) 10 MG capsule Take 1 capsule (10 mg total) by mouth 3 (three) times daily as needed (for regular painful contractions). 30 capsule 0 Past Week   Prenatal Vit-Fe Fumarate-FA (PRENATAL MULTIVITAMIN) TABS tablet Take 1 tablet by mouth daily at 12 noon.   01/13/2022   acyclovir (ZOVIRAX) 400 MG tablet Take 1 tablet (400 mg total) by mouth 3 (three) times daily. 90 tablet 3    aspirin EC 81 MG tablet Take 1 tablet (81 mg total) by mouth daily. Swallow whole. 30 tablet 11    bictegravir-emtricitabine-tenofovir AF (BIKTARVY) 50-200-25 MG TABS tablet Take 1 tablet by mouth daily. 30 tablet 11    cyclobenzaprine (FLEXERIL) 5 MG tablet Take 1 tablet (5 mg total) by mouth at bedtime as needed for muscle spasms. (Patient not taking: Reported on 12/14/2021) 30 tablet 0    escitalopram (LEXAPRO) 5 MG tablet Take 1 tablet (5  mg total) by mouth daily. (Patient not taking: Reported on 07/04/2021) 30 tablet 4    nystatin-triamcinolone (MYCOLOG II) cream Apply to affected area daily (Patient not taking: Reported on 01/10/2022) 15 g 0    ondansetron (ZOFRAN-ODT) 4 MG disintegrating tablet Take 1 tablet (4 mg total) by mouth every 8 (eight) hours as needed for nausea or vomiting. (Patient not taking: Reported on 01/10/2022) 20 tablet 1    QUEtiapine (SEROQUEL) 50 MG tablet Take 1 tablet (50 mg total) by mouth at bedtime. (Patient not taking: Reported on 07/04/2021) 90 tablet 3    terconazole (TERAZOL 7) 0.4 % vaginal cream Place 1 applicator vaginally at bedtime. Use for seven days (Patient not taking: Reported on 12/26/2021) 45 g 0     Review of Systems  Constitutional:  Negative for chills and fever.  Gastrointestinal:  Positive for abdominal pain (Cramping/contractions). Negative for nausea and  vomiting.  Genitourinary:  Negative for difficulty urinating, dysuria, vaginal bleeding and vaginal discharge.  Neurological:  Negative for dizziness, light-headedness and headaches.   Physical Exam   Blood pressure 116/78, pulse 84.  Physical Exam Vitals reviewed. Exam conducted with a chaperone present.  Constitutional:      General: She is in acute distress (With contractions. Breathing rapidly and appears in pain).     Appearance: Normal appearance.  HENT:     Head: Normocephalic and atraumatic.  Eyes:     Conjunctiva/sclera: Conjunctivae normal.  Cardiovascular:     Rate and Rhythm: Normal rate.  Pulmonary:     Effort: Pulmonary effort is normal. No respiratory distress.  Abdominal:     Palpations: Abdomen is soft.     Tenderness: There is no abdominal tenderness.     Comments: Gravid, appears AGA  Genitourinary:    Comments: Cultures collected blindly Dilation: Closed Station: Ballot[place] Exam by:: Gavin Pound, CNM  Musculoskeletal:        General: Normal range of motion.     Cervical back: Normal range of motion.     Right lower leg: No edema.     Left lower leg: No edema.  Skin:    General: Skin is warm and dry.  Neurological:     Mental Status: She is alert and oriented to person, place, and time.  Psychiatric:        Mood and Affect: Mood normal.        Behavior: Behavior normal.    Fetal Assessment 125 bpm, Mod Var, -Decels, +Accels Toco: Palpates mild every 5 minutes  MAU Course   Results for orders placed or performed during the hospital encounter of 01/13/22 (from the past 24 hour(s))  Wet prep, genital     Status: None   Collection Time: 01/13/22  1:21 AM  Result Value Ref Range   Yeast Wet Prep HPF POC NONE SEEN NONE SEEN   Trich, Wet Prep NONE SEEN NONE SEEN   Clue Cells Wet Prep HPF POC NONE SEEN NONE SEEN   WBC, Wet Prep HPF POC <10 <10   Sperm NONE SEEN    No results found.  MDM PE Labs:Wet Prep, GC/CT  EFM Start IV LR  bolus Pain medication Assessment and Plan  21 year old G2P0010  SIUP at 34.4 weeks Cat I FT Preterm contractions   -Exam performed and findings discussed. -Cultures collected. -Discussed pain medication and patient agreeable. -Start IV and give bolus. -Will give morphine IM 4 mg if no improvement after 569mL. -NST reactive  Gavin Pound, CNM 01/13/2022 1:02 AM  Reassessment (  2:17 AM) -Results negative -Patient given IV morphine. -Monitor and reassess   Reassessment (2:44 AM)  -Provider to bedside. -Patient reports pain now 5/10. -Discussed usage of terbutaline and patient declines stating she would like to go home. -Precautions reviewed. -Encouraged to call primary office or return to MAU if symptoms worsen or with the onset of new symptoms. -Discharged to home in improved condition.  Maryann Conners MSN, CNM Advanced Practice Provider, [facility]

## 2022-01-13 NOTE — MAU Note (Signed)
.  Alyssa Sandoval is a 21 y.o. at [redacted]w[redacted]d here in MAU reporting: ctx 4-5 minutes apart (9/10) starting last night. Denies VB or LOF. Reports good FM. LMP: N/A Onset of complaint: Today Pain score: 9/10 There were no vitals filed for this visit.   FHT:145 Lab orders placed from triage:  none

## 2022-01-15 ENCOUNTER — Telehealth: Payer: Self-pay | Admitting: *Deleted

## 2022-01-15 ENCOUNTER — Ambulatory Visit (INDEPENDENT_AMBULATORY_CARE_PROVIDER_SITE_OTHER): Payer: Medicaid Other | Admitting: Advanced Practice Midwife

## 2022-01-15 ENCOUNTER — Encounter: Payer: Self-pay | Admitting: Advanced Practice Midwife

## 2022-01-15 VITALS — BP 109/72 | HR 87 | Wt 166.0 lb

## 2022-01-15 DIAGNOSIS — O0992 Supervision of high risk pregnancy, unspecified, second trimester: Secondary | ICD-10-CM

## 2022-01-15 DIAGNOSIS — Z3A34 34 weeks gestation of pregnancy: Secondary | ICD-10-CM

## 2022-01-15 LAB — GC/CHLAMYDIA PROBE AMP (~~LOC~~) NOT AT ARMC
Chlamydia: NEGATIVE
Comment: NEGATIVE
Comment: NORMAL
Neisseria Gonorrhea: NEGATIVE

## 2022-01-15 NOTE — Telephone Encounter (Signed)
Patient states she is still having contractions despite taking the Procardia and some of her grandma's "prescribed Tylenol".  States contractions are every 2 minutes and is having pain in her butt. Says she is tired of having these contractions and is "going to get this baby out of her now, if she has to take Castrol". Informed patient she is still preterm.  Advised if she was in that much pain, she should go to MAU for a labor eval, however, if she was not, we could see her and check her cervix.  If she was not dilated, then she would be sent back home.  Patient stated she was going to come to our office.

## 2022-01-15 NOTE — Progress Notes (Signed)
HIGH-RISK PREGNANCY VISIT Patient name: Alyssa Sandoval MRN [id number]  Date of birth: [birth year] Chief Complaint:   work-in ob contraction  History of Present Illness:   Alyssa Sandoval is a 21 y.o. G2P0010 female at [redacted]w[redacted]d with an Estimated Date of Delivery: 02/20/22 being seen today for ongoing management of a low-risk pregnancy.  Today she reports painful ctx.  Tired of it. Takes procardia prn, no help (10mg  at a time). Contractions: Regular.  .  Movement: Present. denies leaking of fluid. Review of Systems:   Pertinent items are noted in HPI Denies abnormal vaginal discharge w/ itching/odor/irritation, headaches, visual changes, shortness of breath, chest pain, abdominal pain, severe nausea/vomiting, or problems with urination or bowel movements unless otherwise stated above. Pertinent History Reviewed:  Reviewed past medical,surgical, social, obstetrical and family history.  Reviewed problem list, medications and allergies. Physical Assessment:   Vitals:   01/15/22 1612  BP: 109/72  Pulse: 87  Weight: 166 lb (75.3 kg)  Body mass index is 34.69 kg/m.        Physical Examination:   General appearance: Well appearing, and in no distress  Mental status: Alert, oriented to person, place, and time  Skin: Warm & dry  Cardiovascular: Normal heart rate noted  Respiratory: Normal respiratory effort, no distress  Abdomen: Soft, gravid, nontender  Pelvic: Cervical exam performed  Dilation: Closed Effacement (%): 50 Station: Ballotable  Extremities: Edema: None  Fetal Status: Fetal Heart Rate (bpm): 140   Movement: Present NST: FHR baseline 140 bpm, Variability: moderate, Accelerations:present, Decelerations:  Absent= Cat 1/Reactive   Ctx q 3-5 minutes, mild  Chaperone: Alice Rieger    Results for orders placed or performed during the hospital encounter of 01/18/22 (from the past 24 hour(s))  Urinalysis, Routine w reflex microscopic   Collection Time: 01/18/22  4:43 AM  Result Value  Ref Range   Color, Urine YELLOW YELLOW   APPearance HAZY (A) CLEAR   Specific Gravity, Urine 1.008 1.005 - 1.030   pH 6.0 5.0 - 8.0   Glucose, UA NEGATIVE NEGATIVE mg/dL   Hgb urine dipstick NEGATIVE NEGATIVE   Bilirubin Urine NEGATIVE NEGATIVE   Ketones, ur NEGATIVE NEGATIVE mg/dL   Protein, ur NEGATIVE NEGATIVE mg/dL   Nitrite NEGATIVE NEGATIVE   Leukocytes,Ua NEGATIVE NEGATIVE  CBC   Collection Time: 01/18/22  5:15 AM  Result Value Ref Range   WBC 14.5 (H) 4.0 - 10.5 K/uL   RBC 3.67 (L) 3.87 - 5.11 MIL/uL   Hemoglobin 9.1 (L) 12.0 - 15.0 g/dL   HCT 27.9 (L) 36.0 - 46.0 %   MCV 76.0 (L) 80.0 - 100.0 fL   MCH 24.8 (L) 26.0 - 34.0 pg   MCHC 32.6 30.0 - 36.0 g/dL   RDW 14.7 11.5 - 15.5 %   Platelets 309 150 - 400 K/uL   nRBC 0.0 0.0 - 0.2 %  Comprehensive metabolic panel   Collection Time: 01/18/22  5:15 AM  Result Value Ref Range   Sodium 136 135 - 145 mmol/L   Potassium 3.6 3.5 - 5.1 mmol/L   Chloride 102 98 - 111 mmol/L   CO2 20 (L) 22 - 32 mmol/L   Glucose, Bld 99 70 - 99 mg/dL   BUN <5 (L) 6 - 20 mg/dL   Creatinine, Ser 0.38 (L) 0.44 - 1.00 mg/dL   Calcium 9.3 8.9 - 10.3 mg/dL   Total Protein 6.2 (L) 6.5 - 8.1 g/dL   Albumin 2.7 (L) 3.5 - 5.0 g/dL   AST 18 15 -  41 U/L   ALT 14 0 - 44 U/L   Alkaline Phosphatase 123 38 - 126 U/L   Total Bilirubin 0.4 0.3 - 1.2 mg/dL   GFR, Estimated >60 >60 mL/min   Anion gap 14 5 - 15  Lipase, blood   Collection Time: 01/18/22  5:15 AM  Result Value Ref Range   Lipase 25 11 - 51 U/L    Assessment & Plan:  1) High-risk pregnancy G2P0010 at [redacted]w[redacted]d with an Estimated Date of Delivery: 02/20/22   2) false labor, may take Procardia 30mg  QID prn ctx    Meds: No orders of the defined types were placed in this encounter.  Labs/procedures today: cx exam, NST  Plan:  Continue routine obstetrical care  Next visit: prefers in person    Reviewed: Preterm labor symptoms and general obstetric precautions including but not limited to  vaginal bleeding, contractions, leaking of fluid and fetal movement were reviewed in detail with the patient.  All questions were answered. Has home bp cuff.. Check bp weekly, let us know if >140/90.   Follow-up: Return for As scheduled.  Future Appointments  Date Time Provider [place]  01/24/2022  8:30 AM [facility] [place] [facility] None  01/24/2022  9:30 AM Myrtis Ser, CNM [facility]  03/15/2022  9:45 AM Elna Breslow Ples Specter, FNP [facility]    No orders of the defined types were placed in this encounter.  Christin Fudge DNP, CNM 01/18/2022 8:54 AM

## 2022-01-15 NOTE — Patient Instructions (Signed)

## 2022-01-18 ENCOUNTER — Inpatient Hospital Stay (HOSPITAL_COMMUNITY): Payer: Medicaid Other

## 2022-01-18 ENCOUNTER — Encounter (HOSPITAL_COMMUNITY): Payer: Self-pay | Admitting: Obstetrics and Gynecology

## 2022-01-18 ENCOUNTER — Inpatient Hospital Stay (HOSPITAL_COMMUNITY)
Admission: AD | Admit: 2022-01-18 | Discharge: 2022-01-18 | Disposition: A | Discharge status: Home / Self Care | Payer: Medicaid Other | Attending: Obstetrics and Gynecology | Admitting: Obstetrics and Gynecology

## 2022-01-18 DIAGNOSIS — R1011 Right upper quadrant pain: Secondary | ICD-10-CM | POA: Insufficient documentation

## 2022-01-18 DIAGNOSIS — O26893 Other specified pregnancy related conditions, third trimester: Secondary | ICD-10-CM | POA: Insufficient documentation

## 2022-01-18 DIAGNOSIS — T887XXA Unspecified adverse effect of drug or medicament, initial encounter: Secondary | ICD-10-CM

## 2022-01-18 DIAGNOSIS — Z3A35 35 weeks gestation of pregnancy: Secondary | ICD-10-CM | POA: Insufficient documentation

## 2022-01-18 DIAGNOSIS — O212 Late vomiting of pregnancy: Secondary | ICD-10-CM | POA: Diagnosis not present

## 2022-01-18 DIAGNOSIS — R519 Headache, unspecified: Secondary | ICD-10-CM | POA: Diagnosis not present

## 2022-01-18 LAB — COMPREHENSIVE METABOLIC PANEL
ALT: 14 U/L (ref 0–44)
AST: 18 U/L (ref 15–41)
Albumin: 2.7 g/dL — ABNORMAL LOW (ref 3.5–5.0)
Alkaline Phosphatase: 123 U/L (ref 38–126)
Anion gap: 14 (ref 5–15)
BUN: <5 mg/dL — ABNORMAL LOW (ref 6–20)
CO2: 20 mmol/L — ABNORMAL LOW (ref 22–32)
Calcium: 9.3 mg/dL (ref 8.9–10.3)
Chloride: 102 mmol/L (ref 98–111)
Creatinine, Ser: 0.38 mg/dL — ABNORMAL LOW (ref 0.44–1.00)
GFR, Estimated: >60 mL/min (ref >60)
Glucose, Bld: 99 mg/dL (ref 70–99)
Potassium: 3.6 mmol/L (ref 3.5–5.1)
Sodium: 136 mmol/L (ref 135–145)
Total Bilirubin: 0.4 mg/dL (ref 0.3–1.2)
Total Protein: 6.2 g/dL — ABNORMAL LOW (ref 6.5–8.1)

## 2022-01-18 LAB — CBC
HCT: 27.9 % — ABNORMAL LOW (ref 36.0–46.0)
Hemoglobin: 9.1 g/dL — ABNORMAL LOW (ref 12.0–15.0)
MCH: 24.8 pg — ABNORMAL LOW (ref 26.0–34.0)
MCHC: 32.6 g/dL (ref 30.0–36.0)
MCV: 76 fL — ABNORMAL LOW (ref 80.0–100.0)
Platelets: 309 10*3/uL (ref 150–400)
RBC: 3.67 MIL/uL — ABNORMAL LOW (ref 3.87–5.11)
RDW: 14.7 % (ref 11.5–15.5)
WBC: 14.5 10*3/uL — ABNORMAL HIGH (ref 4.0–10.5)
nRBC: 0 % (ref 0.0–0.2)

## 2022-01-18 LAB — URINALYSIS, ROUTINE W REFLEX MICROSCOPIC
Bilirubin Urine: NEGATIVE
Glucose, UA: NEGATIVE mg/dL
Hgb urine dipstick: NEGATIVE
Ketones, ur: NEGATIVE mg/dL
Leukocytes,Ua: NEGATIVE
Nitrite: NEGATIVE
Protein, ur: NEGATIVE mg/dL
Specific Gravity, Urine: 1.008 (ref 1.005–1.030)
pH: 6 (ref 5.0–8.0)

## 2022-01-18 LAB — LIPASE, BLOOD: Lipase: 25 U/L (ref 11–51)

## 2022-01-18 MED ORDER — OXYCODONE HCL 5 MG PO TABS
5.0000 mg | ORAL_TABLET | Freq: Once | ORAL | Status: AC
  Administered 2022-01-18: 5 mg via ORAL
  Filled 2022-01-18: qty 1

## 2022-01-18 NOTE — MAU Provider Note (Addendum)
Chief Complaint:  Headache   Event Date/Time   First Provider Initiated Contact with Patient 01/18/22 0454     HPI: Alyssa Sandoval is a 21 y.o. G2P0010 at 42w2dwho presents to maternity admissions reporting headache after taking Procardia  Has been taking Procardia 10mg  for preterm contractions and states was told to take 3-4 of them, so took 4.  Then developed a headache unresponsive to Tylenol   Since arriving here, has developed RUQ pain.  No nausea.. She reports good fetal movement, denies LOF, vaginal bleeding, vaginal itching/burning, urinary symptoms, n/v, diarrhea, constipation or fever/chills.    Headache  This is a new problem. The current episode started today. The problem occurs constantly. The problem has been unchanged. The pain is located in the Frontal and retro-orbital region. The pain does not radiate. The quality of the pain is described as aching and dull. Associated symptoms include abdominal pain (RUQ). Pertinent negatives include no blurred vision, fever, nausea, visual change or vomiting. Nothing aggravates the symptoms. She has tried acetaminophen for the symptoms. The treatment provided no relief.   RN Note: .Alyssa Sandoval is a 21 y.o. at [redacted]w[redacted]d here in MAU reporting: HA since 1700 yesterday. Pt reports she went to sleep and woke up with it still hurt with dizziness and lightheadedness. Pt stated she took her increased dose of Procardia last night earlier in usual for her preterm ctxs and the HA started 40 mins after. Pt states she took one Tylenol pm, with no relief. Pt also reports N/V, with two episodes of vomiting this am after she woke up. Pt report calling her OB and was told to come be evaluated. Pt denies VB, LOF, other PIH s/s and DFM,   Onset of complaint: 1700  Past Medical History: Past Medical History:  Diagnosis Date   Chronic constipation 06/10/2012   COVID-19 04/2020   Dysfunctional uterine bleeding    GERD (gastroesophageal reflux disease) 06/10/2012    Headache 12/09/2020   High-risk pregnancy 07/13/2021   HIV (human immunodeficiency virus infection) (HCC)    Low back pain 12/09/2020   Passive suicidal ideations 11/07/2020   Vaginal pruritus 03/15/2021   Vaginal yeast infection     Past obstetric history: OB History  Gravida Para Term Preterm AB Living  2 0 0 0 1 0  SAB IAB Ectopic Multiple Live Births  1 0 0 0 0    # Outcome Date GA Lbr Len/2nd Weight Sex Delivery Anes PTL Lv  2 Current           1 SAB 2020            Past Surgical History: Past Surgical History:  Procedure Laterality Date   NO PAST SURGERIES      Family History: Family History  Problem Relation Age of Onset   Obesity Mother    Fibromyalgia Mother    Mental illness Mother    Hypertension Maternal Grandmother    Colon cancer Maternal Grandfather     Social History: Social History   Tobacco Use   Smoking status: Former    Types: 2021, E-cigarettes   Smokeless tobacco: Never   Tobacco comments:    vapes  Vaping Use   Vaping Use: Never used  Substance Use Topics   Alcohol use: No   Drug use: Not Currently    Types: Marijuana    Allergies: No Known Allergies  Meds:  Medications Prior to Admission  Medication Sig Dispense Refill Last Dose   acetaminophen (TYLENOL) 650 MG CR  tablet Take 650 mg by mouth every 8 (eight) hours as needed for pain.   01/18/2022 at 0100   bictegravir-emtricitabine-tenofovir AF (BIKTARVY) 50-200-25 MG TABS tablet Take 1 tablet by mouth daily. 30 tablet 11 01/17/2022   NIFEdipine (PROCARDIA) 10 MG capsule Take 1 capsule (10 mg total) by mouth 3 (three) times daily as needed (for regular painful contractions). 30 capsule 0 01/17/2022 at 1700   acyclovir (ZOVIRAX) 400 MG tablet Take 1 tablet (400 mg total) by mouth 3 (three) times daily. (Patient not taking: Reported on 01/15/2022) 90 tablet 3    aspirin EC 81 MG tablet Take 1 tablet (81 mg total) by mouth daily. Swallow whole. (Patient not taking: Reported on  01/15/2022) 30 tablet 11    cyclobenzaprine (FLEXERIL) 5 MG tablet Take 1 tablet (5 mg total) by mouth at bedtime as needed for muscle spasms. (Patient not taking: Reported on 12/14/2021) 30 tablet 0    escitalopram (LEXAPRO) 5 MG tablet Take 1 tablet (5 mg total) by mouth daily. (Patient not taking: Reported on 07/04/2021) 30 tablet 4    nystatin-triamcinolone (MYCOLOG II) cream Apply to affected area daily (Patient not taking: Reported on 01/10/2022) 15 g 0    Prenatal Vit-Fe Fumarate-FA (PRENATAL MULTIVITAMIN) TABS tablet Take 1 tablet by mouth daily at 12 noon.      QUEtiapine (SEROQUEL) 50 MG tablet Take 1 tablet (50 mg total) by mouth at bedtime. (Patient not taking: Reported on 07/04/2021) 90 tablet 3     I have reviewed patient's Past Medical Hx, Surgical Hx, Family Hx, Social Hx, medications and allergies.   ROS:  Review of Systems  Constitutional:  Negative for fever.  Eyes:  Negative for blurred vision.  Gastrointestinal:  Positive for abdominal pain (RUQ). Negative for nausea and vomiting.  Neurological:  Positive for headaches.   Other systems negative  Physical Exam  Patient Vitals for the past 24 hrs:  BP Temp Temp src Pulse Resp SpO2 Height Weight  01/18/22 0437 118/87 -- -- 97 -- -- -- --  01/18/22 0410 123/83 98.2 F (36.8 C) Oral 92 18 98 % 5' (1.524 m) 76 kg   Constitutional: Well-developed, well-nourished female in no acute distress.  Cardiovascular: normal rate and rhythm Respiratory  Resp unlabored GI: Abd soft, non-tender, gravid appropriate for gestational age.   No rebound or guarding. MS: Extremities nontender, no edema, normal ROM Neurologic: Alert and oriented x 4.  GU: Neg CVAT.   FHT:  Baseline 125 , moderate variability, accelerations present, no decelerations Contractions:  Irregular     Labs: Results for orders placed or performed during the hospital encounter of 01/18/22 (from the past 24 hour(s))  Urinalysis, Routine w reflex microscopic      Status: Abnormal   Collection Time: 01/18/22  4:43 AM  Result Value Ref Range   Color, Urine YELLOW YELLOW   APPearance HAZY (A) CLEAR   Specific Gravity, Urine 1.008 1.005 - 1.030   pH 6.0 5.0 - 8.0   Glucose, UA NEGATIVE NEGATIVE mg/dL   Hgb urine dipstick NEGATIVE NEGATIVE   Bilirubin Urine NEGATIVE NEGATIVE   Ketones, ur NEGATIVE NEGATIVE mg/dL   Protein, ur NEGATIVE NEGATIVE mg/dL   Nitrite NEGATIVE NEGATIVE   Leukocytes,Ua NEGATIVE NEGATIVE  CBC     Status: Abnormal   Collection Time: 01/18/22  5:15 AM  Result Value Ref Range   WBC 14.5 (H) 4.0 - 10.5 K/uL   RBC 3.67 (L) 3.87 - 5.11 MIL/uL   Hemoglobin 9.1 (L) 12.0 -  15.0 g/dL   HCT 27.9 (L) 36.0 - 46.0 %   MCV 76.0 (L) 80.0 - 100.0 fL   MCH 24.8 (L) 26.0 - 34.0 pg   MCHC 32.6 30.0 - 36.0 g/dL   RDW 14.7 11.5 - 15.5 %   Platelets 309 150 - 400 K/uL   nRBC 0.0 0.0 - 0.2 %  Comprehensive metabolic panel     Status: Abnormal   Collection Time: 01/18/22  5:15 AM  Result Value Ref Range   Sodium 136 135 - 145 mmol/L   Potassium 3.6 3.5 - 5.1 mmol/L   Chloride 102 98 - 111 mmol/L   CO2 20 (L) 22 - 32 mmol/L   Glucose, Bld 99 70 - 99 mg/dL   BUN <5 (L) 6 - 20 mg/dL   Creatinine, Ser 0.38 (L) 0.44 - 1.00 mg/dL   Calcium 9.3 8.9 - 10.3 mg/dL   Total Protein 6.2 (L) 6.5 - 8.1 g/dL   Albumin 2.7 (L) 3.5 - 5.0 g/dL   AST 18 15 - 41 U/L   ALT 14 0 - 44 U/L   Alkaline Phosphatase 123 38 - 126 U/L   Total Bilirubin 0.4 0.3 - 1.2 mg/dL   GFR, Estimated >60 >60 mL/min   Anion gap 14 5 - 15  Lipase, blood     Status: None   Collection Time: 01/18/22  5:15 AM  Result Value Ref Range   Lipase 25 11 - 51 U/L    --/--/AB POS (05/13 1234)  Imaging:  US Abdomen Limited RUQ (LIVER/GB)  Result Date: 01/18/2022 CLINICAL DATA:  Right upper quadrant pain. Nausea and vomiting. [redacted] weeks pregnant. EXAM: ULTRASOUND ABDOMEN LIMITED RIGHT UPPER QUADRANT COMPARISON:  None Available. FINDINGS: Gallbladder: Normal, without stone, wall  thickening, or pericholecystic fluid. Sonographic Murphy's sign was not elicited. Common bile duct: Diameter: Normal, 2 mm Liver: No focal lesion identified. Within normal limits in parenchymal echogenicity. Portal vein is patent on color Doppler imaging with normal direction of blood flow towards the liver. Other: Moderate right-sided hydronephrosis. IMPRESSION: No cholelithiasis or biliary duct dilatation. Moderate right-sided hydronephrosis. Although this could be physiologic in this gravid patient, distal obstruction cannot be excluded. If this is a clinical concern, consider dedicated MRI. Electronically Signed   By: Abigail Miyamoto M.D.   On: 01/18/2022 05:57     MAU Course/MDM: I have reviewed the triage vital signs and the nursing notes.   Pertinent labs & imaging results that were available during my care of the patient were reviewed by me and considered in my medical decision making (see chart for details).      I have reviewed her medical records including past results, notes and treatments.   I have ordered labs and reviewed results. No evidence of infection, biliary obstruction or cholelithiasis. NST reviewed, reactive with irregular contractions  Treatments in MAU included Oxy IR which did help headache somewhat.   Reviewed negative gallbladder workup.  RUQ pain likely chest wall pain, now somewhat improved.   Declines recheck of cervix, states contractions are just "false labor"  Assessment: Single IUP at [redacted]w[redacted]d Headache following Procardia RUQ pain  Plan: Discharge home Preterm Labor precautions and fetal kick counts Follow up in Office for prenatal visits  Encouraged to return if she develops worsening of symptoms, increase in pain, fever, or other concerning symptoms.   Pt stable at time of discharge.  Hansel Feinstein CNM, MSN Certified Nurse-Midwife 01/18/2022 4:54 AM

## 2022-01-18 NOTE — MAU Note (Signed)
.  Lavetta Geier is a 21 y.o. at [redacted]w[redacted]d here in MAU reporting: HA since 1700 yesterday. Pt reports she went to sleep and woke up with it still hurt with dizziness and lightheadedness. Pt stated she took her increased dose of Procardia last night earlier in usual for her preterm ctxs and the HA started 40 mins after. Pt states she took one Tylenol pm, with no relief. Pt also reports N/V, with two episodes of vomiting this am after she woke up. Pt report calling her OB and was told to come be evaluated. Pt denies VB, LOF, other PIH s/s and DFM,   Onset of complaint: 1700 Pain score: 9/10 Vitals:   01/18/22 0410  BP: 123/83  Pulse: 92  Resp: 18  Temp: 98.2 F (36.8 C)  SpO2: 98%     FHT:135 Lab orders placed from triage:

## 2022-01-23 ENCOUNTER — Other Ambulatory Visit: Payer: Self-pay | Admitting: Advanced Practice Midwife

## 2022-01-23 DIAGNOSIS — O98713 Human immunodeficiency virus [HIV] disease complicating pregnancy, third trimester: Secondary | ICD-10-CM

## 2022-01-24 ENCOUNTER — Ambulatory Visit (INDEPENDENT_AMBULATORY_CARE_PROVIDER_SITE_OTHER): Payer: Medicaid Other | Admitting: Advanced Practice Midwife

## 2022-01-24 ENCOUNTER — Ambulatory Visit (INDEPENDENT_AMBULATORY_CARE_PROVIDER_SITE_OTHER): Payer: Medicaid Other

## 2022-01-24 ENCOUNTER — Encounter: Payer: Self-pay | Admitting: Advanced Practice Midwife

## 2022-01-24 ENCOUNTER — Other Ambulatory Visit (HOSPITAL_COMMUNITY)
Admission: RE | Admit: 2022-01-24 | Discharge: 2022-01-24 | Disposition: A | Discharge status: Home / Self Care | Payer: Medicaid Other | Source: Ambulatory Visit | Attending: Advanced Practice Midwife | Admitting: Advanced Practice Midwife

## 2022-01-24 ENCOUNTER — Other Ambulatory Visit: Payer: Self-pay | Admitting: Advanced Practice Midwife

## 2022-01-24 VITALS — BP 112/69 | HR 88 | Wt 170.2 lb

## 2022-01-24 DIAGNOSIS — Z3A36 36 weeks gestation of pregnancy: Secondary | ICD-10-CM

## 2022-01-24 DIAGNOSIS — O409XX0 Polyhydramnios, unspecified trimester, not applicable or unspecified: Secondary | ICD-10-CM | POA: Insufficient documentation

## 2022-01-24 DIAGNOSIS — Z21 Asymptomatic human immunodeficiency virus [HIV] infection status: Secondary | ICD-10-CM | POA: Diagnosis present

## 2022-01-24 DIAGNOSIS — O0992 Supervision of high risk pregnancy, unspecified, second trimester: Secondary | ICD-10-CM | POA: Diagnosis present

## 2022-01-24 DIAGNOSIS — O98713 Human immunodeficiency virus [HIV] disease complicating pregnancy, third trimester: Secondary | ICD-10-CM | POA: Diagnosis not present

## 2022-01-24 DIAGNOSIS — O403XX0 Polyhydramnios, third trimester, not applicable or unspecified: Secondary | ICD-10-CM | POA: Insufficient documentation

## 2022-01-24 MED ORDER — ACYCLOVIR 400 MG PO TABS: 400.0000 mg | ORAL_TABLET | Freq: Three times a day (TID) | ORAL | 3 refills | Status: DC

## 2022-01-24 NOTE — Progress Notes (Signed)
HIGH-RISK PREGNANCY VISIT Patient name: Alyssa Sandoval MRN [id number]  Date of birth: Apr 01, 2001 Chief Complaint:   Routine Prenatal Visit  History of Present Illness:   Alyssa Sandoval is a 21 y.o. G2P0010 female at [redacted]w[redacted]d with an Estimated Date of Delivery: 02/20/22 being seen today for ongoing management of a high-risk pregnancy complicated by HIV.    Today she reports no complaints. Contractions: Irritability. Vag. Bleeding: None.  Movement: Present. denies leaking of fluid.      01/24/2022    9:51 AM 12/19/2021    9:46 AM 09/18/2021    1:53 PM 08/09/2021    3:55 PM 05/02/2021    8:32 AM  Depression screen PHQ 2/9  Decreased Interest 3 0 0 1 0  Down, Depressed, Hopeless 0 0 0 0 0  PHQ - 2 Score 3 0 0 1 0  Altered sleeping 3   1   Tired, decreased energy 3   1   Change in appetite 0   1   Feeling bad or failure about yourself  0   0   Trouble concentrating 0   0   Moving slowly or fidgety/restless 0   0   Suicidal thoughts 0   0   PHQ-9 Score 9   4         01/24/2022    9:52 AM 08/09/2021    3:56 PM 10/19/2020    2:11 PM  GAD 7 : Generalized Anxiety Score  Nervous, Anxious, on Edge 0 1 3  Control/stop worrying 0 0 3  Worry too much - different things 0 1 3  Trouble relaxing 0 0 3  Restless 0 0 0  Easily annoyed or irritable 0 0 3  Afraid - awful might happen 0 0 3  Total GAD 7 Score 0 2 18     Review of Systems:   Pertinent items are noted in HPI Denies abnormal vaginal discharge w/ itching/odor/irritation, headaches, visual changes, shortness of breath, chest pain, abdominal pain, severe nausea/vomiting, or problems with urination or bowel movements unless otherwise stated above. Pertinent History Reviewed:  Reviewed past medical,surgical, social, obstetrical and family history.  Reviewed problem list, medications and allergies. Physical Assessment:   Vitals:   01/24/22 0945  BP: 112/69  Pulse: 88  Weight: 170 lb 4 oz (77.2 kg)  Body mass index is 33.25 kg/m.            Physical Examination:   General appearance: alert, well appearing, and in no distress  Mental status: alert, oriented to person, place, and time  Skin: warm & dry   Extremities: Edema: Trace    Cardiovascular: normal heart rate noted  Respiratory: normal respiratory effort, no distress  Abdomen: gravid, soft, non-tender  Pelvic: Cervical exam performed  Dilation: Closed Effacement (%): 50 Station: Ballotable  Fetal Status: Fetal Heart Rate (bpm): 140 u/s   Movement: Present Presentation: Vertex  Fetal Surveillance Testing today: [place] 36 wks,cephalic,anterior placenta gr 3,polyhydramnios,AFI 32 cm,EFW 3026 g 69%,FHR 140 bpm,LVEICF X 2    No results found for this or any previous visit (from the past 24 hour(s)).  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    Dispense:  90 tablet    Refill:  3    Order Specific Question:   Supervising Provider    Answer:   Janyth Pupa OS:[id number]    Labs/procedures today: GBS, GC/CT, SVE, and U/S  Treatment Plan:  start 2x/wk testing next week; IOL scheduled for 39wks  Reviewed: Term labor symptoms and general obstetric precautions including but not limited to vaginal bleeding, contractions, leaking of fluid and fetal movement were reviewed in detail with the patient.  All questions were answered. Does have home bp cuff. Office bp cuff given: not  applicable. Check bp daily, let us know if consistently >140 and/or >90.  Follow-up: Return for next week start weekly BPP + NST x 2 wks.   Future Appointments  Date Time Provider [place]  03/15/2022  9:45 AM [place], FNP [facility]    Orders Placed This Encounter  Procedures   HIV 1 RNA quant-no reflex-bld   T-helper cells (CD4) count   Myrtis Ser [facility] 01/24/2022 10:18 AM

## 2022-01-24 NOTE — Patient Instructions (Signed)
Alyssa Sandoval, thank you for choosing our office today! We appreciate the opportunity to meet your healthcare needs. You may receive a short survey by mail, e-mail, or through [employer]. If you are happy with your care we would appreciate if you could take just a few minutes to complete the survey questions. We read all of your comments and take your feedback very seriously. Thank you again for choosing our office.  Center for [employer] Team at [place] at [facility] ([place], [place] [address]) Entrance C, located off of [place] parking   CLASSES: Go to [email or web address] to register for classes (childbirth, breastfeeding, waterbirth, infant CPR, daddy bootcamp, etc.)  Call the office [phone]) or go to [facility] if: You begin to have strong, frequent contractions Your water breaks.  Sometimes it is a big gush of fluid, sometimes it is just a trickle that keeps getting your panties wet or running down your legs You have vaginal bleeding.  It is normal to have a small amount of spotting if your cervix was checked.  You don't feel your baby moving like normal.  If you don't, get you something to eat and drink and lay down and focus on feeling your baby move.   If your baby is still not moving like normal, you should call the office or go to [facility].  Call the office [phone]) or go to [facility] hospital for these signs of pre-eclampsia: Severe headache that does not go away with Tylenol Visual changes- seeing spots, double, blurred vision Pain under your right breast or upper abdomen that does not go away with Tums or heartburn medicine Nausea and/or vomiting Severe swelling in your hands, feet, and face   Tdap Vaccine It is recommended that you get the Tdap vaccine during the third trimester of EACH pregnancy to help protect your baby from getting pertussis (whooping cough) 27-36 weeks is the BEST time to do  this so that you can pass the protection on to your baby. During pregnancy is better than after pregnancy, but if you are unable to get it during pregnancy it will be offered at the hospital.  You can get this vaccine with [place], at the health department, your family doctor, or some local pharmacies Everyone who will be around your baby should also be up-to-date on their vaccines before the baby comes. Adults (who are not pregnant) only need 1 dose of Tdap during adulthood.   [facility] Pediatricians/Family Doctors [facility]): [address] [place], [facility]: [address] [place], [phone]                [facility]): [place], [phone] (call to ask if accepting patients) [facility] Department: [address], [place], [place] Pediatricians/Family Doctors [facility]): [place]. [place], [address], [place] [facility]: [address] [place], [place] [facility] [place]: [place], [place] [facility]): [phone] [facility]: [address] [place]: [address] [place] [facility]: [phone], [phone]  Home Blood Pressure Monitoring for Patients   Your provider has recommended that you check your  blood pressure (BP) at least once a week at home. If you do not have a blood pressure cuff at home, one will be provided for you. Contact your provider if you have not received your monitor within 1 week.   Helpful Tips for Accurate Home Blood Pressure Checks  Don't smoke, exercise, or drink caffeine 30 minutes before checking your BP Use the restroom before checking your BP (a full bladder can raise your  pressure) Relax in a comfortable upright chair Feet on the ground Left arm resting comfortably on a flat surface at the level of your heart Legs uncrossed Back supported Sit quietly and don't talk Place the cuff on your bare arm Adjust snuggly, so that only two fingertips can fit between your skin and the top of the cuff Check 2 readings separated by at least one minute Keep a log of your BP readings For a visual, please reference this diagram: http://ccnc.care/bpdiagram  Provider Name: [facility]     Phone: [phone]  Zone 1: ALL CLEAR  Continue to monitor your symptoms:  BP reading is less than 140 (top number) or less than 90 (bottom number)  No right upper stomach pain No headaches or seeing spots No feeling nauseated or throwing up No swelling in face and hands  Zone 2: CAUTION Call your doctor's office for any of the following:  BP reading is greater than 140 (top number) or greater than 90 (bottom number)  Stomach pain under your ribs in the middle or right side Headaches or seeing spots Feeling nauseated or throwing up Swelling in face and hands  Zone 3: EMERGENCY  Seek immediate medical care if you have any of the following:  BP reading is greater than160 (top number) or greater than 110 (bottom number) Severe headaches not improving with Tylenol Serious difficulty catching your breath Any worsening symptoms from Zone 2   Third Trimester of Pregnancy The third trimester is from week 29 through week 42, months 7 through 9. The third trimester is a time when the fetus is growing rapidly. At the end of the ninth month, the fetus is about 20 inches in length and weighs 6-10 pounds.  BODY CHANGES Your body goes through many changes during pregnancy. The changes vary from woman to woman.  Your weight will continue to increase. You can expect to gain 25-35 pounds (11-16 kg) by the end of the pregnancy. You may begin to get stretch marks on your hips, abdomen,  and breasts. You may urinate more often because the fetus is moving lower into your pelvis and pressing on your bladder. You may develop or continue to have heartburn as a result of your pregnancy. You may develop constipation because certain hormones are causing the muscles that push waste through your intestines to slow down. You may develop hemorrhoids or swollen, bulging veins (varicose veins). You may have pelvic pain because of the weight gain and pregnancy hormones relaxing your joints between the bones in your pelvis. Backaches may result from overexertion of the muscles supporting your posture. You may have changes in your hair. These can include thickening of your hair, rapid growth, and changes in texture. Some women also have hair loss during or after pregnancy, or hair that feels dry or thin. Your hair will most likely return to normal after your baby is born. Your breasts will continue to grow and be tender. A yellow discharge may leak from your breasts called colostrum. Your belly button may stick out. You may   feel short of breath because of your expanding uterus. You may notice the fetus "dropping," or moving lower in your abdomen. You may have a bloody mucus discharge. This usually occurs a few days to a week before labor begins. Your cervix becomes thin and soft (effaced) near your due date. WHAT TO EXPECT AT YOUR PRENATAL EXAMS  You will have prenatal exams every 2 weeks until week 36. Then, you will have weekly prenatal exams. During a routine prenatal visit: You will be weighed to make sure you and the fetus are growing normally. Your blood pressure is taken. Your abdomen will be measured to track your baby's growth. The fetal heartbeat will be listened to. Any test results from the previous visit will be discussed. You may have a cervical check near your due date to see if you have effaced. At around 36 weeks, your caregiver will check your cervix. At the same time, your  caregiver will also perform a test on the secretions of the vaginal tissue. This test is to determine if a type of bacteria, Group B streptococcus, is present. Your caregiver will explain this further. Your caregiver may ask you: What your birth plan is. How you are feeling. If you are feeling the baby move. If you have had any abnormal symptoms, such as leaking fluid, bleeding, severe headaches, or abdominal cramping. If you have any questions. Other tests or screenings that may be performed during your third trimester include: Blood tests that check for low iron levels (anemia). Fetal testing to check the health, activity level, and growth of the fetus. Testing is done if you have certain medical conditions or if there are problems during the pregnancy. FALSE LABOR You may feel small, irregular contractions that eventually go away. These are called Braxton Hicks contractions, or false labor. Contractions may last for hours, days, or even weeks before true labor sets in. If contractions come at regular intervals, intensify, or become painful, it is best to be seen by your caregiver.  SIGNS OF LABOR  Menstrual-like cramps. Contractions that are 5 minutes apart or less. Contractions that start on the top of the uterus and spread down to the lower abdomen and back. A sense of increased pelvic pressure or back pain. A watery or bloody mucus discharge that comes from the vagina. If you have any of these signs before the 37th week of pregnancy, call your caregiver right away. You need to go to the hospital to get checked immediately. HOME CARE INSTRUCTIONS  Avoid all smoking, herbs, alcohol, and unprescribed drugs. These chemicals affect the formation and growth of the baby. Follow your caregiver's instructions regarding medicine use. There are medicines that are either safe or unsafe to take during pregnancy. Exercise only as directed by your caregiver. Experiencing uterine cramps is a good sign to  stop exercising. Continue to eat regular, healthy meals. Wear a good support bra for breast tenderness. Do not use hot tubs, steam rooms, or saunas. Wear your seat belt at all times when driving. Avoid raw meat, uncooked cheese, cat litter boxes, and soil used by cats. These carry germs that can cause birth defects in the baby. Take your prenatal vitamins. Try taking a stool softener (if your caregiver approves) if you develop constipation. Eat more high-fiber foods, such as fresh vegetables or fruit and whole grains. Drink plenty of fluids to keep your urine clear or pale yellow. Take warm sitz baths to soothe any pain or discomfort caused by hemorrhoids. Use hemorrhoid cream if   your caregiver approves. If you develop varicose veins, wear support hose. Elevate your feet for 15 minutes, 3-4 times a day. Limit salt in your diet. Avoid heavy lifting, wear low heal shoes, and practice good posture. Rest a lot with your legs elevated if you have leg cramps or low back pain. Visit your dentist if you have not gone during your pregnancy. Use a soft toothbrush to brush your teeth and be gentle when you floss. A sexual relationship may be continued unless your caregiver directs you otherwise. Do not travel far distances unless it is absolutely necessary and only with the approval of your caregiver. Take prenatal classes to understand, practice, and ask questions about the labor and delivery. Make a trial run to the hospital. Pack your hospital bag. Prepare the baby's nursery. Continue to go to all your prenatal visits as directed by your caregiver. SEEK MEDICAL CARE IF: You are unsure if you are in labor or if your water has broken. You have dizziness. You have mild pelvic cramps, pelvic pressure, or nagging pain in your abdominal area. You have persistent nausea, vomiting, or diarrhea. You have a bad smelling vaginal discharge. You have pain with urination. SEEK IMMEDIATE MEDICAL CARE IF:  You  have a fever. You are leaking fluid from your vagina. You have spotting or bleeding from your vagina. You have severe abdominal cramping or pain. You have rapid weight loss or gain. You have shortness of breath with chest pain. You notice sudden or extreme swelling of your face, hands, ankles, feet, or legs. You have not felt your baby move in over an hour. You have severe headaches that do not go away with medicine. You have vision changes. Document Released: 03/20/2001 Document Revised: 03/31/2013 Document Reviewed: 05/27/2012 [facility] Patient Information 2015 [place], [place]. This information is not intended to replace advice given to you by your health care provider. Make sure you discuss any questions you have with your health care provider.

## 2022-01-24 NOTE — Progress Notes (Signed)
[place] 36 wks,cephalic,anterior placenta gr 3,polyhydramnios,AFI 32 cm,EFW 3026 g 69%,FHR 140 bpm

## 2022-01-25 LAB — CERVICOVAGINAL ANCILLARY ONLY
Chlamydia: NEGATIVE
Comment: NEGATIVE
Comment: NORMAL
Neisseria Gonorrhea: NEGATIVE

## 2022-01-26 ENCOUNTER — Other Ambulatory Visit: Payer: Self-pay | Admitting: Advanced Practice Midwife

## 2022-01-26 DIAGNOSIS — O409XX0 Polyhydramnios, unspecified trimester, not applicable or unspecified: Secondary | ICD-10-CM

## 2022-01-26 DIAGNOSIS — O98713 Human immunodeficiency virus [HIV] disease complicating pregnancy, third trimester: Secondary | ICD-10-CM

## 2022-01-27 ENCOUNTER — Other Ambulatory Visit: Payer: Self-pay | Admitting: Advanced Practice Midwife

## 2022-01-27 DIAGNOSIS — B951 Streptococcus, group B, as the cause of diseases classified elsewhere: Secondary | ICD-10-CM | POA: Insufficient documentation

## 2022-01-27 DIAGNOSIS — O0992 Supervision of high risk pregnancy, unspecified, second trimester: Secondary | ICD-10-CM

## 2022-01-27 LAB — CULTURE, BETA STREP (GROUP B ONLY): Strep Gp B Culture: POSITIVE — AB

## 2022-01-29 ENCOUNTER — Other Ambulatory Visit: Payer: Self-pay

## 2022-01-29 ENCOUNTER — Ambulatory Visit (INDEPENDENT_AMBULATORY_CARE_PROVIDER_SITE_OTHER): Payer: Medicaid Other

## 2022-01-29 ENCOUNTER — Ambulatory Visit (INDEPENDENT_AMBULATORY_CARE_PROVIDER_SITE_OTHER): Payer: Medicaid Other | Admitting: Obstetrics & Gynecology

## 2022-01-29 ENCOUNTER — Other Ambulatory Visit: Payer: Medicaid Other

## 2022-01-29 ENCOUNTER — Encounter: Payer: Self-pay | Admitting: Obstetrics & Gynecology

## 2022-01-29 VITALS — BP 113/81 | HR 93 | Wt 170.0 lb

## 2022-01-29 DIAGNOSIS — Z21 Asymptomatic human immunodeficiency virus [HIV] infection status: Secondary | ICD-10-CM | POA: Insufficient documentation

## 2022-01-29 DIAGNOSIS — O409XX0 Polyhydramnios, unspecified trimester, not applicable or unspecified: Secondary | ICD-10-CM

## 2022-01-29 DIAGNOSIS — O98713 Human immunodeficiency virus [HIV] disease complicating pregnancy, third trimester: Secondary | ICD-10-CM

## 2022-01-29 DIAGNOSIS — Z3A36 36 weeks gestation of pregnancy: Secondary | ICD-10-CM | POA: Diagnosis not present

## 2022-01-29 DIAGNOSIS — B20 Human immunodeficiency virus [HIV] disease: Secondary | ICD-10-CM

## 2022-01-29 DIAGNOSIS — O0993 Supervision of high risk pregnancy, unspecified, third trimester: Secondary | ICD-10-CM

## 2022-01-29 DIAGNOSIS — O403XX0 Polyhydramnios, third trimester, not applicable or unspecified: Secondary | ICD-10-CM

## 2022-01-29 NOTE — Progress Notes (Signed)
HIGH-RISK PREGNANCY VISIT Patient name: Alyssa Sandoval MRN [id number]  Date of birth: [birth year] Chief Complaint:   Routine Prenatal Visit (Vaginal discharge present the whole pregnancy per patient. Would like something prescribed. Patient refused exam. )  History of Present Illness:   Alyssa Sandoval is a 21 y.o. G2P0010 female at [redacted]w[redacted]d with an Estimated Date of Delivery: 02/20/22 being seen today for ongoing management of a high-risk pregnancy complicated by polyhydramnios, moderate, stable 32 cm, HIV+ on meds.    Today she reports no complaints. Contractions: Irritability. Vag. Bleeding: None.  Movement: Present. denies leaking of fluid.      01/24/2022    9:51 AM 12/19/2021    9:46 AM 09/18/2021    1:53 PM 08/09/2021    3:55 PM 05/02/2021    8:32 AM  Depression screen PHQ 2/9  Decreased Interest 3 0 0 1 0  Down, Depressed, Hopeless 0 0 0 0 0  PHQ - 2 Score 3 0 0 1 0  Altered sleeping 3   1   Tired, decreased energy 3   1   Change in appetite 0   1   Feeling bad or failure about yourself  0   0   Trouble concentrating 0   0   Moving slowly or fidgety/restless 0   0   Suicidal thoughts 0   0   PHQ-9 Score 9   4         01/24/2022    9:52 AM 08/09/2021    3:56 PM 10/19/2020    2:11 PM  GAD 7 : Generalized Anxiety Score  Nervous, Anxious, on Edge 0 1 3  Control/stop worrying 0 0 3  Worry too much - different things 0 1 3  Trouble relaxing 0 0 3  Restless 0 0 0  Easily annoyed or irritable 0 0 3  Afraid - awful might happen 0 0 3  Total GAD 7 Score 0 2 18     Review of Systems:   Pertinent items are noted in HPI Denies abnormal vaginal discharge w/ itching/odor/irritation, headaches, visual changes, shortness of breath, chest pain, abdominal pain, severe nausea/vomiting, or problems with urination or bowel movements unless otherwise stated above. Pertinent History Reviewed:  Reviewed past medical,surgical, social, obstetrical and family history.  Reviewed problem list,  medications and allergies. Physical Assessment:   Vitals:   01/29/22 1217  BP: 113/81  Pulse: 93  Weight: 170 lb (77.1 kg)  Body mass index is 33.2 kg/m.           Physical Examination:   General appearance: alert, well appearing, and in no distress  Mental status: alert, oriented to person, place, and time  Skin: warm & dry   Extremities: Edema: None    Cardiovascular: normal heart rate noted  Respiratory: normal respiratory effort, no distress  Abdomen: gravid, soft, non-tender  Pelvic: Cervical exam deferred         Fetal Status:     Movement: Present    Fetal Surveillance Testing today: BPP 8/8   Chaperone: N/A    No results found for this or any previous visit (from the past 24 hour(s)).  Assessment & Plan:  High-risk pregnancy: G2P0010 at [redacted]w[redacted]d with an Estimated Date of Delivery: 02/20/22      ICD-10-CM   1. Supervision of high risk pregnancy in third trimester  O09.93     2. Polyhydramnios in third trimester complication, single or unspecified fetus  O40.3XX0    32 cm, moderate, stable  idiopathic polyhydramnios    3. HIV positive ([place])  Z21         Meds: No orders of the defined types were placed in this encounter.   Orders: No orders of the defined types were placed in this encounter.    Labs/procedures today: U/S  Treatment Plan:  twice weekly testing    Follow-up: Return for keep scheduled.   Future Appointments  Date Time Provider [place]  01/29/2022  1:30 PM Florian Buff, MD [facility]  02/01/2022 11:10 AM [facility] NURSE [facility]  02/05/2022  3:00 PM [facility] [place] [facility] None  02/05/2022  3:50 PM Janyth Pupa, DO [facility]  02/08/2022 10:30 AM [facility] NURSE [facility]  02/13/2022 12:00 AM [facility] SCHED ROOM [facility] None  03/15/2022  9:45 AM Golden Circle, FNP [facility]    No orders of the defined types were placed in this encounter.  Florian Buff  Attending Physician for the Center  for [facility] 01/29/2022 12:50 PM

## 2022-01-29 NOTE — Progress Notes (Signed)
[place] 76+5 wks,cephalic,anterior placenta gr 3,polyhydramnios,AFI 32 cm,FHR 130 bpm,2 LVEICF,RVEICF,BPP 8/8

## 2022-01-31 ENCOUNTER — Inpatient Hospital Stay (HOSPITAL_COMMUNITY)
Admission: AD | Admit: 2022-01-31 | Discharge: 2022-01-31 | Disposition: A | Discharge status: Home / Self Care | Payer: Medicaid Other | Attending: Obstetrics and Gynecology | Admitting: Obstetrics and Gynecology

## 2022-01-31 ENCOUNTER — Telehealth (HOSPITAL_COMMUNITY): Payer: Self-pay | Admitting: *Deleted

## 2022-01-31 ENCOUNTER — Other Ambulatory Visit: Payer: Self-pay

## 2022-01-31 ENCOUNTER — Encounter (HOSPITAL_COMMUNITY): Payer: Self-pay | Admitting: Obstetrics and Gynecology

## 2022-01-31 ENCOUNTER — Encounter (HOSPITAL_COMMUNITY): Payer: Self-pay

## 2022-01-31 DIAGNOSIS — Z3A37 37 weeks gestation of pregnancy: Secondary | ICD-10-CM

## 2022-01-31 DIAGNOSIS — Z1152 Encounter for screening for COVID-19: Secondary | ICD-10-CM | POA: Insufficient documentation

## 2022-01-31 DIAGNOSIS — Z8616 Personal history of COVID-19: Secondary | ICD-10-CM | POA: Insufficient documentation

## 2022-01-31 DIAGNOSIS — D649 Anemia, unspecified: Secondary | ICD-10-CM | POA: Diagnosis not present

## 2022-01-31 DIAGNOSIS — J069 Acute upper respiratory infection, unspecified: Secondary | ICD-10-CM | POA: Diagnosis not present

## 2022-01-31 DIAGNOSIS — O26893 Other specified pregnancy related conditions, third trimester: Secondary | ICD-10-CM | POA: Insufficient documentation

## 2022-01-31 DIAGNOSIS — O471 False labor at or after 37 completed weeks of gestation: Secondary | ICD-10-CM | POA: Diagnosis not present

## 2022-01-31 DIAGNOSIS — O0992 Supervision of high risk pregnancy, unspecified, second trimester: Secondary | ICD-10-CM

## 2022-01-31 DIAGNOSIS — O99013 Anemia complicating pregnancy, third trimester: Secondary | ICD-10-CM

## 2022-01-31 DIAGNOSIS — O479 False labor, unspecified: Secondary | ICD-10-CM | POA: Diagnosis not present

## 2022-01-31 DIAGNOSIS — J029 Acute pharyngitis, unspecified: Secondary | ICD-10-CM | POA: Diagnosis not present

## 2022-01-31 DIAGNOSIS — R079 Chest pain, unspecified: Secondary | ICD-10-CM | POA: Diagnosis not present

## 2022-01-31 DIAGNOSIS — O0993 Supervision of high risk pregnancy, unspecified, third trimester: Secondary | ICD-10-CM | POA: Insufficient documentation

## 2022-01-31 DIAGNOSIS — B951 Streptococcus, group B, as the cause of diseases classified elsewhere: Secondary | ICD-10-CM

## 2022-01-31 DIAGNOSIS — R197 Diarrhea, unspecified: Secondary | ICD-10-CM | POA: Insufficient documentation

## 2022-01-31 LAB — GROUP A STREP BY PCR: Group A Strep by PCR: NOT DETECTED

## 2022-01-31 LAB — CBC WITH DIFFERENTIAL/PLATELET
Abs Immature Granulocytes: 0.1 10*3/uL — ABNORMAL HIGH (ref 0.00–0.07)
Basophils Absolute: 0 10*3/uL (ref 0.0–0.1)
Basophils Relative: 0 %
Eosinophils Absolute: 0.1 10*3/uL (ref 0.0–0.5)
Eosinophils Relative: 1 %
HCT: 26.7 % — ABNORMAL LOW (ref 36.0–46.0)
Hemoglobin: 8.6 g/dL — ABNORMAL LOW (ref 12.0–15.0)
Immature Granulocytes: 1 %
Lymphocytes Relative: 18 %
Lymphs Abs: 2.2 10*3/uL (ref 0.7–4.0)
MCH: 24.2 pg — ABNORMAL LOW (ref 26.0–34.0)
MCHC: 32.2 g/dL (ref 30.0–36.0)
MCV: 75 fL — ABNORMAL LOW (ref 80.0–100.0)
Monocytes Absolute: 0.8 10*3/uL (ref 0.1–1.0)
Monocytes Relative: 6 %
Neutro Abs: 9.2 10*3/uL — ABNORMAL HIGH (ref 1.7–7.7)
Neutrophils Relative %: 74 %
Platelets: 290 10*3/uL (ref 150–400)
RBC: 3.56 MIL/uL — ABNORMAL LOW (ref 3.87–5.11)
RDW: 15.4 % (ref 11.5–15.5)
WBC: 12.4 10*3/uL — ABNORMAL HIGH (ref 4.0–10.5)
nRBC: 0.2 % (ref 0.0–0.2)

## 2022-01-31 LAB — URINALYSIS, ROUTINE W REFLEX MICROSCOPIC
Bilirubin Urine: NEGATIVE
Glucose, UA: NEGATIVE mg/dL
Hgb urine dipstick: NEGATIVE
Ketones, ur: NEGATIVE mg/dL
Nitrite: NEGATIVE
Protein, ur: NEGATIVE mg/dL
Specific Gravity, Urine: 1.016 (ref 1.005–1.030)
pH: 6 (ref 5.0–8.0)

## 2022-01-31 LAB — RESP PANEL BY RT-PCR (FLU A&B, COVID) ARPGX2
Influenza A by PCR: NEGATIVE
Influenza B by PCR: NEGATIVE
SARS Coronavirus 2 by RT PCR: NEGATIVE

## 2022-01-31 MED ORDER — IPRATROPIUM-ALBUTEROL 0.5-2.5 (3) MG/3ML IN SOLN
3.0000 mL | Freq: Once | RESPIRATORY_TRACT | Status: AC
  Administered 2022-01-31: 3 mL via RESPIRATORY_TRACT
  Filled 2022-01-31: qty 3

## 2022-01-31 MED ORDER — PHENOL 1.4 % MT LIQD
1.0000 | OROMUCOSAL | Status: DC | PRN
  Administered 2022-01-31: 1 via OROMUCOSAL
  Filled 2022-01-31: qty 177

## 2022-01-31 MED ORDER — SODIUM CHLORIDE 0.9 % IV SOLN
510.0000 mg | Freq: Once | INTRAVENOUS | Status: AC
  Administered 2022-01-31: 510 mg via INTRAVENOUS
  Filled 2022-01-31: qty 17

## 2022-01-31 MED ORDER — EPINEPHRINE PF 1 MG/ML IJ SOLN: 0.3000 mg | Freq: Once | INTRAMUSCULAR | Status: DC | PRN

## 2022-01-31 MED ORDER — SODIUM CHLORIDE 0.9 % IV BOLUS
500.0000 mL | Freq: Once | INTRAVENOUS | Status: AC | PRN
  Administered 2022-01-31: 500 mL via INTRAVENOUS

## 2022-01-31 MED ORDER — DIPHENHYDRAMINE HCL 50 MG/ML IJ SOLN: 25.0000 mg | Freq: Once | INTRAMUSCULAR | Status: DC | PRN

## 2022-01-31 MED ORDER — CYCLOBENZAPRINE HCL 5 MG PO TABS
10.0000 mg | ORAL_TABLET | Freq: Once | ORAL | Status: AC
  Administered 2022-01-31: 10 mg via ORAL
  Filled 2022-01-31: qty 2

## 2022-01-31 MED ORDER — METHYLPREDNISOLONE SODIUM SUCC 125 MG IJ SOLR: 125.0000 mg | Freq: Once | INTRAMUSCULAR | Status: DC | PRN

## 2022-01-31 MED ORDER — SODIUM CHLORIDE 0.9 % IV SOLN: INTRAVENOUS | Status: DC | PRN

## 2022-01-31 MED ORDER — ALBUTEROL SULFATE (2.5 MG/3ML) 0.083% IN NEBU
2.5000 mg | INHALATION_SOLUTION | Freq: Once | RESPIRATORY_TRACT | Status: DC | PRN
  Filled 2022-01-31: qty 3

## 2022-01-31 NOTE — Progress Notes (Signed)
Written and verbal d/c instructions given and understanding voiced. 

## 2022-01-31 NOTE — MAU Note (Signed)
OK to d/c EFM per Gailen Shelter CNM

## 2022-01-31 NOTE — Progress Notes (Signed)
Pt states does feel some better after Flexeril and breathing tx. IV iron infusion started.

## 2022-01-31 NOTE — MAU Provider Note (Signed)
History     CSN: [id number]  Arrival date and time: 01/31/22 [id number]   Event Date/Time   First Provider Initiated Contact with Patient 01/31/22 0125      Chief Complaint  Patient presents with   Vaginal Discharge   Sore Throat   Chest Pain   Diarrhea   Alyssa Sandoval, a  21 y.o. G2P0010 at [redacted]w[redacted]d presents to MAU with complaints of Sore throat, chest pain with cold and flu like symptoms and contractions. Patient states that for the last "few days" she hasd been experiencing cold and flu like symptoms including nasal congestion, SOB cough and sneezing. She attempted Tylenol Cold and flu without relief. Last dose at 2200. States sore throat feels like "tiny cuts on the throat". Currently rating pain 8/10. She denies a history of asthma or even having to use an inhaler. Denies allergies. Declined flu vaccine. She endorses being around others who have been sick over the weekend.   Chest pain started "a few hours ago prior to arrival to MAU. Worsened by movement coughing and movement. States she got "some relief with Tylenol earlier", but no relief at this time. States chest "just feels tight." Centralized to mid sternum. Denies turning or moving wrong. Rating chest pain 8/10. Denies numbness or tingling.   Contractions: Patient states have been on-going. She is currently taking procardia for PTL. Patient states she lost her mucous plug prior to arrival. She denies vaginal bleeding, leaking of fluid and endorses positive fetal movement.         OB History     Gravida  2   Para  0   Term  0   Preterm  0   AB  1   Living  0      SAB  1   IAB  0   Ectopic  0   Multiple  0   Live Births  0           Past Medical History:  Diagnosis Date   Chronic constipation 06/10/2012   COVID-19 04/2020   Dysfunctional uterine bleeding    GERD (gastroesophageal reflux disease) 06/10/2012   Headache 12/09/2020   High-risk pregnancy 07/13/2021   HIV (human immunodeficiency virus  infection) ([place])    Low back pain 12/09/2020   Passive suicidal ideations 11/07/2020   Vaginal pruritus 03/15/2021   Vaginal yeast infection     Past Surgical History:  Procedure Laterality Date   NO PAST SURGERIES      Family History  Problem Relation Age of Onset   Obesity Mother    Fibromyalgia Mother    Mental illness Mother    Hypertension Maternal Grandmother    Colon cancer Maternal Grandfather     Social History   Tobacco Use   Smoking status: Former    Types: Cigars, E-cigarettes   Smokeless tobacco: Never   Tobacco comments:    vapes  Vaping Use   Vaping Use: Never used  Substance Use Topics   Alcohol use: No   Drug use: Not Currently    Types: Marijuana    Allergies: No Known Allergies  Medications Prior to Admission  Medication Sig Dispense Refill Last Dose   bictegravir-emtricitabine-tenofovir AF (BIKTARVY) 50-200-25 MG TABS tablet Take 1 tablet by mouth daily. 30 tablet 11 01/30/2022   cyclobenzaprine (FLEXERIL) 5 MG tablet Take 1 tablet (5 mg total) by mouth at bedtime as needed for muscle spasms. 30 tablet 0 Past Month   Phenyleph-CPM-DM-APAP (TYLENOL COLD MULTI-SYMPTOM PO) Take 2  tablets by mouth every 4 (four) hours as needed.   01/30/2022   Prenatal Vit-Fe Fumarate-FA (PRENATAL MULTIVITAMIN) TABS tablet Take 1 tablet by mouth daily at 12 noon.   01/30/2022   acetaminophen (TYLENOL) 650 MG CR tablet Take 650 mg by mouth every 8 (eight) hours as needed for pain.      acyclovir (ZOVIRAX) 400 MG tablet Take 1 tablet (400 mg total) by mouth 3 (three) times daily. 90 tablet 3    aspirin EC 81 MG tablet Take 1 tablet (81 mg total) by mouth daily. Swallow whole. (Patient not taking: Reported on 01/29/2022) 30 tablet 11    escitalopram (LEXAPRO) 5 MG tablet Take 1 tablet (5 mg total) by mouth daily. 30 tablet 4    NIFEdipine (PROCARDIA) 10 MG capsule Take 1 capsule (10 mg total) by mouth 3 (three) times daily as needed (for regular painful contractions). 30  capsule 0    nystatin-triamcinolone (MYCOLOG II) cream Apply to affected area daily 15 g 0    QUEtiapine (SEROQUEL) 50 MG tablet Take 1 tablet (50 mg total) by mouth at bedtime. 90 tablet 3     Review of Systems  Constitutional:  Positive for chills. Negative for fatigue and fever.  HENT:  Positive for congestion, sinus pressure and sneezing.   Eyes:  Negative for pain and visual disturbance.  Respiratory:  Positive for cough. Negative for apnea, shortness of breath and wheezing.   Cardiovascular:  Positive for chest pain. Negative for palpitations.  Gastrointestinal:  Positive for abdominal pain and diarrhea. Negative for constipation, nausea and vomiting.  Genitourinary:  Positive for pelvic pain and vaginal discharge. Negative for difficulty urinating, dysuria, vaginal bleeding and vaginal pain.  Musculoskeletal:  Negative for back pain.  Neurological:  Negative for dizziness, seizures, weakness, numbness and headaches.  Psychiatric/Behavioral:  Negative for suicidal ideas.    Physical Exam   Blood pressure 117/79, pulse 89, temperature 98 F (36.7 C), resp. rate 16, height 4\' 11"  (1.499 m), weight 78.5 kg, SpO2 99 %.  Physical Exam Vitals and nursing note reviewed.  Constitutional:      General: She is not in acute distress.    Appearance: Normal appearance. She is ill-appearing.  HENT:     Head: Normocephalic.  Cardiovascular:     Rate and Rhythm: Normal rate and regular rhythm.  Pulmonary:     Effort: No respiratory distress.     Breath sounds: Normal breath sounds. No wheezing.  Abdominal:     Palpations: Abdomen is soft.  Musculoskeletal:     Cervical back: Normal range of motion.  Skin:    General: Skin is warm and dry.     Capillary Refill: Capillary refill takes less than 2 seconds.  Neurological:     Mental Status: She is alert and oriented to person, place, and time.  Psychiatric:        Mood and Affect: Mood normal.    FHT: 125 bpm with moderate  variability. 15x15 accels no decels.  Toco: ctx 6-9 mins with UI.  Dilation: 1 Effacement (%): 40 Cervical Position: Anterior Presentation: Undeterminable (Unable to reach presenting part) Exam by:: Blima Singer RNC  MAU Course  Procedures Orders Placed This Encounter  Procedures   Resp Panel by RT-PCR (Flu A&B, Covid) Anterior Nasal Swab   Group A Strep by PCR   Culture, OB Urine   Urinalysis, Routine w reflex microscopic Urine, Clean Catch   CBC with Differential/Platelet   Vital signs prior to infusion, at infusion  completion, and at end of 1 hour monitoring period   Refer to Sidebar Report OB Iron Replacement Hypersensitivity Algorithm   Monitor pt for hypersensitivity reaction during the infusion and for 1 hour after infusion is completed   Mild Hypersensitivity: itching, flushing, back/joint pain, slight chest tightness, urticaria, sensation of heat, hypertension, nausea/vomiting, dizziness, headache   Moderate Hypersensitivity: cough, shortness of breath, tachycardia, hypotension, syncope, periorbital/angioedema   Anaphylaxis/Severe Hypersensitivity: severe chest tightness, tachycardia, hypotension, syncope, wheezing/stridor, periorbital/angioedema, unresponsiveness   Airborne and Contact precautions   ED EKG   Discharge patient   Meds ordered this encounter  Medications   phenol (CHLORASEPTIC) mouth spray 1 spray   cyclobenzaprine (FLEXERIL) tablet 10 mg   ipratropium-albuterol (DUONEB) 0.5-2.5 (3) MG/3ML nebulizer solution 3 mL   sodium chloride 0.9 % bolus 500 mL   methylPREDNISolone sodium succinate (SOLU-MEDROL) 125 mg/2 mL injection 125 mg    IV methylprednisolone will be converted to either a q12h or q24h frequency with the same total daily dose (TDD).  Ordered Dose: 1 to 125 mg TDD; convert to: TDD q24h.  Ordered Dose: 126 to 250 mg TDD; convert to: TDD div q12h.  Ordered Dose: >250 mg TDD; DAW.   EPINEPHrine (ADRENALIN) 0.3 mg   albuterol (PROVENTIL) (2.5 MG/3ML)  0.083% nebulizer solution 2.5 mg   diphenhydrAMINE (BENADRYL) injection 25 mg   0.9 %  sodium chloride infusion   ferumoxytol (FERAHEME) 510 mg in sodium chloride 0.9 % 100 mL IVPB   Results for orders placed or performed during the hospital encounter of 01/31/22 (from the past 24 hour(s))  Urinalysis, Routine w reflex microscopic Urine, Clean Catch     Status: Abnormal   Collection Time: 01/31/22  1:05 AM  Result Value Ref Range   Color, Urine YELLOW YELLOW   APPearance HAZY (A) CLEAR   Specific Gravity, Urine 1.016 1.005 - 1.030   pH 6.0 5.0 - 8.0   Glucose, UA NEGATIVE NEGATIVE mg/dL   Hgb urine dipstick NEGATIVE NEGATIVE   Bilirubin Urine NEGATIVE NEGATIVE   Ketones, ur NEGATIVE NEGATIVE mg/dL   Protein, ur NEGATIVE NEGATIVE mg/dL   Nitrite NEGATIVE NEGATIVE   Leukocytes,Ua LARGE (A) NEGATIVE   RBC / HPF 0-5 0 - 5 RBC/hpf   WBC, UA 6-10 0 - 5 WBC/hpf   Bacteria, UA FEW (A) NONE SEEN   Squamous Epithelial / LPF 6-10 0 - 5   Mucus PRESENT   Resp Panel by RT-PCR (Flu A&B, Covid) Anterior Nasal Swab     Status: None   Collection Time: 01/31/22  1:30 AM   Specimen: Anterior Nasal Swab  Result Value Ref Range   SARS Coronavirus 2 by RT PCR NEGATIVE NEGATIVE   Influenza A by PCR NEGATIVE NEGATIVE   Influenza B by PCR NEGATIVE NEGATIVE  Group A Strep by PCR     Status: None   Collection Time: 01/31/22  2:10 AM   Specimen: Throat; Sterile Swab  Result Value Ref Range   Group A Strep by PCR NOT DETECTED NOT DETECTED  CBC with Differential/Platelet     Status: Abnormal   Collection Time: 01/31/22  2:30 AM  Result Value Ref Range   WBC 12.4 (H) 4.0 - 10.5 K/uL   RBC 3.56 (L) 3.87 - 5.11 MIL/uL   Hemoglobin 8.6 (L) 12.0 - 15.0 g/dL   HCT 26.7 (L) 36.0 - 46.0 %   MCV 75.0 (L) 80.0 - 100.0 fL   MCH 24.2 (L) 26.0 - 34.0 pg   MCHC 32.2 30.0 -  36.0 g/dL   RDW 15.4 11.5 - 15.5 %   Platelets 290 150 - 400 K/uL   nRBC 0.2 0.0 - 0.2 %   Neutrophils Relative % 74 %   Neutro Abs 9.2  (H) 1.7 - 7.7 K/uL   Lymphocytes Relative 18 %   Lymphs Abs 2.2 0.7 - 4.0 K/uL   Monocytes Relative 6 %   Monocytes Absolute 0.8 0.1 - 1.0 K/uL   Eosinophils Relative 1 %   Eosinophils Absolute 0.1 0.0 - 0.5 K/uL   Basophils Relative 0 %   Basophils Absolute 0.0 0.0 - 0.1 K/uL   Immature Granulocytes 1 %   Abs Immature Granulocytes 0.10 (H) 0.00 - 0.07 K/uL    MDM - EKG NSR  - UA reflexed to culture  - Cervical Exam unchanged from previous visit. Low suspicion for preterm labor  - Sats >98% on RA, Lungs clear bilaterally. Low suspicion for pneumonia   - Patient anemic with HBG of 8.6, IV Feraheme ordered  - COVID and Flu Negative  - Strep Negative  - Per pt Breathing improved s/p Neb treatment  - Chest pain relieved with PO Flexeril and Chloraseptic spray.   - Patient resting well.  - Plan for discharge  Assessment and Plan   1. Viral upper respiratory infection   2. High-risk pregnancy, second trimester   3. Positive testing for group B Streptococcus   4. Anemia in pregnancy, third trimester   5. [redacted] weeks gestation of pregnancy   6. False labor    - Discussed that this is likely a cold or Upper respiratory infection and will run its course.  - Worsening signs and symptoms reviewed and when to return to MAU discussed.  - OTC options for nasal congestion and cough discussed. Safe Medications in Pregnancy list  provided.  - FHT Cat I upon discharge  - Term labor precautions reviewed.  - Patient discharged home in stable condition and may return to MAU as needed.   Jacquiline Doe, MSN CNM  01/31/2022, 5:10 AM

## 2022-01-31 NOTE — Telephone Encounter (Signed)
Preadmission screen  

## 2022-01-31 NOTE — Discharge Instructions (Signed)

## 2022-01-31 NOTE — MAU Note (Signed)
.  Alyssa Sandoval is a 21 y.o. at [redacted]w[redacted]d here in MAU reporting chest pain since yesterday that is sharp. Sore throat. Watery diarrhea x 5. Having some ctxs. Denies LOF. Some bright spotting today at times. Good FM  Onset of complaint: yesterday Pain score: chest 8 ctxs are 10 Vitals:   01/31/22 0048 01/31/22 0051  BP:  117/79  Pulse: 95   Resp: 18   Temp: 98 F (36.7 C)   SpO2: 99%      FHT:130 Lab orders placed from triage:  u/a

## 2022-02-01 ENCOUNTER — Ambulatory Visit (INDEPENDENT_AMBULATORY_CARE_PROVIDER_SITE_OTHER): Payer: Medicaid Other | Admitting: *Deleted

## 2022-02-01 ENCOUNTER — Telehealth (HOSPITAL_COMMUNITY): Payer: Self-pay | Admitting: *Deleted

## 2022-02-01 ENCOUNTER — Other Ambulatory Visit: Payer: Self-pay | Admitting: Obstetrics & Gynecology

## 2022-02-01 ENCOUNTER — Encounter (HOSPITAL_COMMUNITY): Payer: Self-pay | Admitting: *Deleted

## 2022-02-01 VITALS — BP 99/70 | HR 100 | Wt 173.0 lb

## 2022-02-01 DIAGNOSIS — O403XX0 Polyhydramnios, third trimester, not applicable or unspecified: Secondary | ICD-10-CM

## 2022-02-01 DIAGNOSIS — O98713 Human immunodeficiency virus [HIV] disease complicating pregnancy, third trimester: Secondary | ICD-10-CM

## 2022-02-01 DIAGNOSIS — B951 Streptococcus, group B, as the cause of diseases classified elsewhere: Secondary | ICD-10-CM

## 2022-02-01 DIAGNOSIS — O0992 Supervision of high risk pregnancy, unspecified, second trimester: Secondary | ICD-10-CM

## 2022-02-01 DIAGNOSIS — O409XX0 Polyhydramnios, unspecified trimester, not applicable or unspecified: Secondary | ICD-10-CM

## 2022-02-01 LAB — CULTURE, OB URINE: Culture: 40000 — AB

## 2022-02-01 NOTE — Telephone Encounter (Signed)
Preadmission screen  

## 2022-02-01 NOTE — Progress Notes (Signed)
   NURSE VISIT- NST  SUBJECTIVE:  Alyssa Sandoval is a 21 y.o. G2P0010 female at [redacted]w[redacted]d, here for a NST for pregnancy complicated by Polyhydramnios.  She reports active fetal movement, contractions: none, vaginal bleeding: none, membranes: intact.   OBJECTIVE:  BP 99/70   Pulse 100   Wt 173 lb (78.5 kg)   LMP  (LMP Unknown) Comment: a month ago.  + home pregnancy test on 3/24  BMI 34.94 kg/m   Appears well, no apparent distress  No results found for this or any previous visit (from the past 24 hour(s)).  NST: FHR baseline 140 bpm, Variability: moderate, Accelerations:present, Decelerations:  Absent= Cat 1/reactive Toco: none   ASSESSMENT: G2P0010 at [redacted]w[redacted]d with Polyhydramnios NST reactive  PLAN: EFM strip reviewed by Dr. Elonda Husky   Recommendations: keep next appointment as scheduled    Janece Canterbury  02/01/2022 1:39 PM

## 2022-02-02 ENCOUNTER — Other Ambulatory Visit: Payer: Self-pay

## 2022-02-02 DIAGNOSIS — O2343 Unspecified infection of urinary tract in pregnancy, third trimester: Secondary | ICD-10-CM

## 2022-02-02 MED ORDER — CEFADROXIL 500 MG PO CAPS: 500.0000 mg | ORAL_CAPSULE | Freq: Two times a day (BID) | ORAL | 0 refills | Status: AC

## 2022-02-04 ENCOUNTER — Other Ambulatory Visit: Payer: Self-pay | Admitting: Advanced Practice Midwife

## 2022-02-05 ENCOUNTER — Ambulatory Visit (INDEPENDENT_AMBULATORY_CARE_PROVIDER_SITE_OTHER): Payer: Medicaid Other

## 2022-02-05 ENCOUNTER — Encounter: Payer: Self-pay | Admitting: Obstetrics & Gynecology

## 2022-02-05 ENCOUNTER — Ambulatory Visit (INDEPENDENT_AMBULATORY_CARE_PROVIDER_SITE_OTHER): Payer: Medicaid Other | Admitting: Obstetrics & Gynecology

## 2022-02-05 VITALS — BP 112/73 | HR 114 | Wt 171.2 lb

## 2022-02-05 DIAGNOSIS — Z3A37 37 weeks gestation of pregnancy: Secondary | ICD-10-CM | POA: Diagnosis not present

## 2022-02-05 DIAGNOSIS — O98713 Human immunodeficiency virus [HIV] disease complicating pregnancy, third trimester: Secondary | ICD-10-CM

## 2022-02-05 DIAGNOSIS — O0992 Supervision of high risk pregnancy, unspecified, second trimester: Secondary | ICD-10-CM

## 2022-02-05 DIAGNOSIS — B951 Streptococcus, group B, as the cause of diseases classified elsewhere: Secondary | ICD-10-CM

## 2022-02-05 DIAGNOSIS — O403XX0 Polyhydramnios, third trimester, not applicable or unspecified: Secondary | ICD-10-CM

## 2022-02-05 DIAGNOSIS — O409XX0 Polyhydramnios, unspecified trimester, not applicable or unspecified: Secondary | ICD-10-CM | POA: Diagnosis not present

## 2022-02-05 DIAGNOSIS — O0973 Supervision of high risk pregnancy due to social problems, third trimester: Secondary | ICD-10-CM

## 2022-02-05 DIAGNOSIS — Z21 Asymptomatic human immunodeficiency virus [HIV] infection status: Secondary | ICD-10-CM

## 2022-02-05 DIAGNOSIS — O2343 Unspecified infection of urinary tract in pregnancy, third trimester: Secondary | ICD-10-CM

## 2022-02-05 NOTE — Progress Notes (Signed)
HIGH-RISK PREGNANCY VISIT Patient name: Alyssa Sandoval MRN [id number]  Date of birth: 2000/10/02 Chief Complaint:   Routine Prenatal Visit  History of Present Illness:   Senetra Dillin is a 21 y.o. G43P0010 female at [redacted]w[redacted]d with an Estimated Date of Delivery: 02/20/22 being seen today for ongoing management of a high-risk pregnancy complicated by:  -HIV pos -Polyhydramnios -GBS bacteruria -HSV on suppression  -Recent UTI taking Duricef   Today she reports  irregular contractions .   Contractions: Irritability. Vag. Bleeding: Scant.  Movement: Present. denies leaking of fluid.      01/24/2022    9:51 AM 12/19/2021    9:46 AM 09/18/2021    1:53 PM 08/09/2021    3:55 PM 05/02/2021    8:32 AM  Depression screen PHQ 2/9  Decreased Interest 3 0 0 1 0  Down, Depressed, Hopeless 0 0 0 0 0  PHQ - 2 Score 3 0 0 1 0  Altered sleeping 3   1   Tired, decreased energy 3   1   Change in appetite 0   1   Feeling bad or failure about yourself  0   0   Trouble concentrating 0   0   Moving slowly or fidgety/restless 0   0   Suicidal thoughts 0   0   PHQ-9 Score 9   4      Current Outpatient Medications  Medication Instructions   acetaminophen (TYLENOL) 650 mg, Oral, Every 8 hours PRN   acyclovir (ZOVIRAX) 400 mg, Oral, 3 times daily   aspirin EC 81 mg, Oral, Daily, Swallow whole.   bictegravir-emtricitabine-tenofovir AF (BIKTARVY) 50-200-25 MG TABS tablet 1 tablet, Oral, Daily   cefadroxil (DURICEF) 500 mg, Oral, 2 times daily   cyclobenzaprine (FLEXERIL) 5 mg, Oral, At bedtime PRN   escitalopram (LEXAPRO) 5 mg, Oral, Daily   NIFEdipine (PROCARDIA) 10 mg, Oral, 3 times daily PRN   nystatin-triamcinolone (MYCOLOG II) cream Apply to affected area daily   Phenyleph-CPM-DM-APAP (TYLENOL COLD MULTI-SYMPTOM PO) 2 tablets, Oral, Every 4 hours PRN   Prenatal Vit-Fe Fumarate-FA (PRENATAL MULTIVITAMIN) TABS tablet 1 tablet, Oral, Daily     Review of Systems:   Pertinent items are noted in  HPI Denies abnormal vaginal discharge w/ itching/odor/irritation, headaches, visual changes, shortness of breath, chest pain, abdominal pain, severe nausea/vomiting, or problems with urination or bowel movements unless otherwise stated above. Pertinent History Reviewed:  Reviewed past medical,surgical, social, obstetrical and family history.  Reviewed problem list, medications and allergies. Physical Assessment:   Vitals:   02/05/22 1547  BP: 112/73  Pulse: (!) 114  Weight: 171 lb 3.2 oz (77.7 kg)  Body mass index is 34.58 kg/m.           Physical Examination:   General appearance: alert, well appearing, and in no distress  Mental status: normal mood, behavior, speech, dress, motor activity, and thought processes  Skin: warm & dry   Extremities: Edema: Trace    Cardiovascular: normal heart rate noted  Respiratory: normal respiratory effort, no distress  Abdomen: gravid, soft, non-tender  Pelvic: Cervical exam performed  Dilation: Closed Effacement (%): 50 Station: Ballotable  Fetal Status:     Movement: Present    Fetal Surveillance Testing today: cephalic,FHR 188 bpm,BPP 4/1,[id number] placenta gr 3,AFI 31.4 cm,polyhydramnios,   Chaperone:  Dr. Orvis Brill     No results found for this or any previous visit (from the past 24 hour(s)).   Assessment & Plan:  High-risk pregnancy: G2P0010 at  [redacted]w[redacted]d with an Estimated Date of Delivery: 02/20/22   1) HIV pos -no change to medication -last viral load collected 10/23  2) Polyhydramnios AFI as above, irregular contractions to be anticipated Continue antepartum testing IOL scheduled for 11/7  3) UTI in pregnancy -on antibiotic currently []  TOC next visit  4) GBS bacteruria PCN in labor  5) HSV -on suppression therapy  Meds: No orders of the defined types were placed in this encounter.   Labs/procedures today: BPP as above  Treatment Plan:  as outlined above  Reviewed: Term labor symptoms and general obstetric  precautions including but not limited to vaginal bleeding, contractions, leaking of fluid and fetal movement were reviewed in detail with the patient.  All questions were answered. Pt has home bp cuff. Check bp weekly, let us know if >140/90.   Follow-up: Return for as scheduled.   Future Appointments  Date Time Provider [place]  02/08/2022 10:30 AM [facility] NURSE [facility]  02/13/2022 12:00 AM [facility] SCHED ROOM [facility] None  03/15/2022  9:45 AM [place], Ples Specter, FNP [facility]    Janyth Pupa, DO Attending [place], [facility] for [facility], [place]

## 2022-02-05 NOTE — Progress Notes (Signed)
[place] 59+1 wks,cephalic,FHR 638 bpm,BPP 4/6,[id number] placenta gr 3,AFI 31.4 cm,polyhydramnios,

## 2022-02-07 ENCOUNTER — Telehealth: Payer: Self-pay | Admitting: *Deleted

## 2022-02-07 LAB — HIV-1 GENOTYPING (RTI,PI,IN INHBTR): HIV-1 Genotype: DETECTED — AB

## 2022-02-07 NOTE — Telephone Encounter (Signed)
Pt called, demanding to speak with dr regarding some lab work. Pt wouldn't tell me anything. Please call pt. Thanks! [place]

## 2022-02-07 NOTE — Telephone Encounter (Signed)
Error. Closing encounter. [place]

## 2022-02-08 ENCOUNTER — Other Ambulatory Visit: Payer: Self-pay

## 2022-02-08 ENCOUNTER — Other Ambulatory Visit: Payer: Medicaid Other

## 2022-02-08 ENCOUNTER — Ambulatory Visit (INDEPENDENT_AMBULATORY_CARE_PROVIDER_SITE_OTHER): Payer: Medicaid Other | Admitting: *Deleted

## 2022-02-08 VITALS — BP 118/72 | HR 100

## 2022-02-08 DIAGNOSIS — O403XX0 Polyhydramnios, third trimester, not applicable or unspecified: Secondary | ICD-10-CM | POA: Diagnosis not present

## 2022-02-08 DIAGNOSIS — B20 Human immunodeficiency virus [HIV] disease: Secondary | ICD-10-CM

## 2022-02-08 DIAGNOSIS — B951 Streptococcus, group B, as the cause of diseases classified elsewhere: Secondary | ICD-10-CM

## 2022-02-08 DIAGNOSIS — O0992 Supervision of high risk pregnancy, unspecified, second trimester: Secondary | ICD-10-CM

## 2022-02-08 DIAGNOSIS — Z3A38 38 weeks gestation of pregnancy: Secondary | ICD-10-CM | POA: Diagnosis not present

## 2022-02-08 NOTE — Progress Notes (Signed)
   NURSE VISIT- NST  SUBJECTIVE:  Alyssa Sandoval is a 21 y.o. G2P0010 female at [redacted]w[redacted]d, here for a NST for pregnancy complicated by HIV+ and Polyhydramnios.  She reports active fetal movement, contractions: irritability, vaginal bleeding: none, membranes: intact.   OBJECTIVE:  BP 118/72   Pulse 100   LMP  (LMP Unknown) Comment: a month ago.  + home pregnancy test on 3/24  Appears well, no apparent distress  No results found for this or any previous visit (from the past 24 hour(s)).  NST: FHR baseline 130 bpm, Variability: moderate, Accelerations:present, Decelerations:  Absent= Cat 1/reactive Toco: occasional   ASSESSMENT: G2P0010 at [redacted]w[redacted]d with A1DM and GHTN NST reactive  PLAN: EFM strip reviewed by Dr. Elonda Husky   Recommendations: keep next appointment as scheduled    Alice Rieger  02/08/2022 10:46 AM

## 2022-02-10 LAB — HIV-1 RNA QUANT-NO REFLEX-BLD
HIV 1 RNA Quant: 30 Copies/mL — ABNORMAL HIGH
HIV-1 RNA Quant, Log: 1.47 Log cps/mL — ABNORMAL HIGH

## 2022-02-12 ENCOUNTER — Inpatient Hospital Stay (HOSPITAL_COMMUNITY)
Admission: RE | Admit: 2022-02-12 | Discharge: 2022-02-16 | DRG: 806 | Disposition: A | Discharge status: Home / Self Care | Payer: Medicaid Other | Attending: Obstetrics & Gynecology | Admitting: Obstetrics & Gynecology

## 2022-02-12 DIAGNOSIS — O403XX0 Polyhydramnios, third trimester, not applicable or unspecified: Principal | ICD-10-CM | POA: Diagnosis present

## 2022-02-12 DIAGNOSIS — Z3A39 39 weeks gestation of pregnancy: Secondary | ICD-10-CM | POA: Diagnosis not present

## 2022-02-12 DIAGNOSIS — O9832 Other infections with a predominantly sexual mode of transmission complicating childbirth: Secondary | ICD-10-CM | POA: Diagnosis present

## 2022-02-12 DIAGNOSIS — Z21 Asymptomatic human immunodeficiency virus [HIV] infection status: Secondary | ICD-10-CM | POA: Diagnosis present

## 2022-02-12 DIAGNOSIS — Z87891 Personal history of nicotine dependence: Secondary | ICD-10-CM

## 2022-02-12 DIAGNOSIS — Z30430 Encounter for insertion of intrauterine contraceptive device: Secondary | ICD-10-CM

## 2022-02-12 DIAGNOSIS — O9872 Human immunodeficiency virus [HIV] disease complicating childbirth: Secondary | ICD-10-CM | POA: Diagnosis present

## 2022-02-12 DIAGNOSIS — B951 Streptococcus, group B, as the cause of diseases classified elsewhere: Secondary | ICD-10-CM | POA: Diagnosis present

## 2022-02-12 DIAGNOSIS — A6000 Herpesviral infection of urogenital system, unspecified: Secondary | ICD-10-CM | POA: Diagnosis present

## 2022-02-12 DIAGNOSIS — O99824 Streptococcus B carrier state complicating childbirth: Secondary | ICD-10-CM | POA: Diagnosis present

## 2022-02-12 DIAGNOSIS — B20 Human immunodeficiency virus [HIV] disease: Secondary | ICD-10-CM | POA: Diagnosis present

## 2022-02-12 DIAGNOSIS — F329 Major depressive disorder, single episode, unspecified: Secondary | ICD-10-CM | POA: Diagnosis present

## 2022-02-12 DIAGNOSIS — Z8616 Personal history of COVID-19: Secondary | ICD-10-CM

## 2022-02-12 DIAGNOSIS — Z7982 Long term (current) use of aspirin: Secondary | ICD-10-CM

## 2022-02-12 DIAGNOSIS — O409XX0 Polyhydramnios, unspecified trimester, not applicable or unspecified: Secondary | ICD-10-CM | POA: Diagnosis present

## 2022-02-12 DIAGNOSIS — A609 Anogenital herpesviral infection, unspecified: Secondary | ICD-10-CM | POA: Diagnosis present

## 2022-02-12 DIAGNOSIS — O99820 Streptococcus B carrier state complicating pregnancy: Secondary | ICD-10-CM | POA: Diagnosis not present

## 2022-02-12 DIAGNOSIS — O0992 Supervision of high risk pregnancy, unspecified, second trimester: Secondary | ICD-10-CM

## 2022-02-12 DIAGNOSIS — R030 Elevated blood-pressure reading, without diagnosis of hypertension: Secondary | ICD-10-CM

## 2022-02-12 DIAGNOSIS — N12 Tubulo-interstitial nephritis, not specified as acute or chronic: Secondary | ICD-10-CM | POA: Diagnosis present

## 2022-02-12 HISTORY — DX: Depression, unspecified: F32.A

## 2022-02-13 ENCOUNTER — Other Ambulatory Visit: Payer: Self-pay

## 2022-02-13 ENCOUNTER — Inpatient Hospital Stay (HOSPITAL_COMMUNITY): Payer: Medicaid Other

## 2022-02-13 ENCOUNTER — Inpatient Hospital Stay (HOSPITAL_COMMUNITY): Payer: Medicaid Other | Admitting: Anesthesiology

## 2022-02-13 ENCOUNTER — Encounter (HOSPITAL_COMMUNITY): Payer: Self-pay | Admitting: Obstetrics and Gynecology

## 2022-02-13 DIAGNOSIS — O409XX0 Polyhydramnios, unspecified trimester, not applicable or unspecified: Secondary | ICD-10-CM

## 2022-02-13 HISTORY — DX: Polyhydramnios, unspecified trimester, not applicable or unspecified: O40.9XX0

## 2022-02-13 LAB — CBC
HCT: 31.2 % — ABNORMAL LOW (ref 36.0–46.0)
Hemoglobin: 9.9 g/dL — ABNORMAL LOW (ref 12.0–15.0)
MCH: 25 pg — ABNORMAL LOW (ref 26.0–34.0)
MCHC: 31.7 g/dL (ref 30.0–36.0)
MCV: 78.8 fL — ABNORMAL LOW (ref 80.0–100.0)
Platelets: 283 10*3/uL (ref 150–400)
RBC: 3.96 MIL/uL (ref 3.87–5.11)
RDW: 22.5 % — ABNORMAL HIGH (ref 11.5–15.5)
WBC: 13.4 10*3/uL — ABNORMAL HIGH (ref 4.0–10.5)
nRBC: 0.1 % (ref 0.0–0.2)

## 2022-02-13 LAB — RPR: RPR Ser Ql: NONREACTIVE

## 2022-02-13 MED ORDER — MISOPROSTOL 50MCG HALF TABLET
50.0000 ug | ORAL_TABLET | Freq: Once | ORAL | Status: AC
  Administered 2022-02-13: 50 ug via ORAL
  Filled 2022-02-13: qty 1

## 2022-02-13 MED ORDER — OXYCODONE-ACETAMINOPHEN 5-325 MG PO TABS: 1.0000 | ORAL_TABLET | ORAL | Status: DC | PRN

## 2022-02-13 MED ORDER — PHENYLEPHRINE 80 MCG/ML (10ML) SYRINGE FOR IV PUSH (FOR BLOOD PRESSURE SUPPORT): 80.0000 ug | PREFILLED_SYRINGE | INTRAVENOUS | Status: DC | PRN

## 2022-02-13 MED ORDER — LIDOCAINE HCL (PF) 1 % IJ SOLN
INTRAMUSCULAR | Status: DC | PRN
  Administered 2022-02-13: 2 mL via EPIDURAL
  Administered 2022-02-13: 10 mL via EPIDURAL

## 2022-02-13 MED ORDER — ZIDOVUDINE 10 MG/ML IV SOLN
2.0000 mg/kg | Freq: Once | INTRAVENOUS | Status: AC
  Administered 2022-02-13: 159 mg via INTRAVENOUS
  Filled 2022-02-13: qty 15.9

## 2022-02-13 MED ORDER — LACTATED RINGERS IV SOLN: INTRAVENOUS | Status: DC

## 2022-02-13 MED ORDER — OXYCODONE-ACETAMINOPHEN 5-325 MG PO TABS: 2.0000 | ORAL_TABLET | ORAL | Status: DC | PRN

## 2022-02-13 MED ORDER — PENICILLIN G POT IN DEXTROSE 60000 UNIT/ML IV SOLN
3.0000 10*6.[IU] | INTRAVENOUS | Status: DC
  Administered 2022-02-13 – 2022-02-14 (×7): 3 10*6.[IU] via INTRAVENOUS
  Filled 2022-02-13 (×10): qty 50

## 2022-02-13 MED ORDER — TERBUTALINE SULFATE 1 MG/ML IJ SOLN: 0.2500 mg | Freq: Once | INTRAMUSCULAR | Status: DC | PRN

## 2022-02-13 MED ORDER — LEVONORGESTREL 20 MCG/DAY IU IUD
1.0000 | INTRAUTERINE_SYSTEM | Freq: Once | INTRAUTERINE | Status: AC
  Administered 2022-02-14: 1 via INTRAUTERINE
  Filled 2022-02-13: qty 1

## 2022-02-13 MED ORDER — TERBUTALINE SULFATE 1 MG/ML IJ SOLN
0.2500 mg | Freq: Once | INTRAMUSCULAR | Status: AC | PRN
  Administered 2022-02-13: 0.25 mg via SUBCUTANEOUS
  Filled 2022-02-13: qty 1

## 2022-02-13 MED ORDER — EPHEDRINE 5 MG/ML INJ: 10.0000 mg | INTRAVENOUS | Status: DC | PRN

## 2022-02-13 MED ORDER — FENTANYL CITRATE (PF) 100 MCG/2ML IJ SOLN
100.0000 ug | INTRAMUSCULAR | Status: DC | PRN
  Administered 2022-02-13 (×3): 100 ug via INTRAVENOUS
  Filled 2022-02-13 (×3): qty 2

## 2022-02-13 MED ORDER — LACTATED RINGERS IV SOLN: 500.0000 mL | INTRAVENOUS | Status: DC | PRN

## 2022-02-13 MED ORDER — OXYTOCIN BOLUS FROM INFUSION
333.0000 mL | Freq: Once | INTRAVENOUS | Status: AC
  Administered 2022-02-14: 333 mL via INTRAVENOUS

## 2022-02-13 MED ORDER — SOD CITRATE-CITRIC ACID 500-334 MG/5ML PO SOLN: 30.0000 mL | ORAL | Status: DC | PRN

## 2022-02-13 MED ORDER — DIPHENHYDRAMINE HCL 50 MG/ML IJ SOLN
12.5000 mg | INTRAMUSCULAR | Status: AC | PRN
  Administered 2022-02-13: 12.5 mg via INTRAVENOUS
  Administered 2022-02-13 – 2022-02-14 (×2): 25 mg via INTRAVENOUS
  Filled 2022-02-13 (×2): qty 1

## 2022-02-13 MED ORDER — FENTANYL-BUPIVACAINE-NACL 0.5-0.125-0.9 MG/250ML-% EP SOLN
12.0000 mL/h | EPIDURAL | Status: DC | PRN
  Filled 2022-02-13 (×2): qty 250

## 2022-02-13 MED ORDER — OXYTOCIN-SODIUM CHLORIDE 30-0.9 UT/500ML-% IV SOLN
1.0000 m[IU]/min | INTRAVENOUS | Status: DC
  Administered 2022-02-13: 2 m[IU]/min via INTRAVENOUS
  Filled 2022-02-13: qty 500

## 2022-02-13 MED ORDER — OXYTOCIN-SODIUM CHLORIDE 30-0.9 UT/500ML-% IV SOLN
2.5000 [IU]/h | INTRAVENOUS | Status: DC
  Administered 2022-02-14: 59.94 [IU]/h via INTRAVENOUS
  Filled 2022-02-13: qty 500

## 2022-02-13 MED ORDER — ZIDOVUDINE 10 MG/ML IV SOLN
1.0000 mg/kg/h | INTRAVENOUS | Status: DC
  Administered 2022-02-13 – 2022-02-14 (×7): 1 mg/kg/h via INTRAVENOUS
  Filled 2022-02-13 (×11): qty 40

## 2022-02-13 MED ORDER — BICTEGRAVIR-EMTRICITAB-TENOFOV 50-200-25 MG PO TABS
1.0000 | ORAL_TABLET | Freq: Every day | ORAL | Status: DC
  Administered 2022-02-13 – 2022-02-16 (×3): 1 via ORAL
  Filled 2022-02-13 (×4): qty 1

## 2022-02-13 MED ORDER — FENTANYL-BUPIVACAINE-NACL 0.5-0.125-0.9 MG/250ML-% EP SOLN
EPIDURAL | Status: DC | PRN
  Administered 2022-02-13: 12 mL/h via EPIDURAL

## 2022-02-13 MED ORDER — SODIUM CHLORIDE 0.9 % IV SOLN
5.0000 10*6.[IU] | Freq: Once | INTRAVENOUS | Status: AC
  Administered 2022-02-13: 5 10*6.[IU] via INTRAVENOUS
  Filled 2022-02-13: qty 5

## 2022-02-13 MED ORDER — MISOPROSTOL 25 MCG QUARTER TABLET
25.0000 ug | ORAL_TABLET | Freq: Once | ORAL | Status: AC
  Administered 2022-02-13: 25 ug via VAGINAL
  Filled 2022-02-13: qty 1

## 2022-02-13 MED ORDER — ONDANSETRON HCL 4 MG/2ML IJ SOLN
4.0000 mg | Freq: Four times a day (QID) | INTRAMUSCULAR | Status: DC | PRN
  Administered 2022-02-13 (×2): 4 mg via INTRAVENOUS
  Filled 2022-02-13 (×2): qty 2

## 2022-02-13 MED ORDER — LACTATED RINGERS IV SOLN: 500.0000 mL | Freq: Once | INTRAVENOUS | Status: DC

## 2022-02-13 MED ORDER — LIDOCAINE HCL (PF) 1 % IJ SOLN
30.0000 mL | INTRAMUSCULAR | Status: AC | PRN
  Administered 2022-02-13: 30 mL via SUBCUTANEOUS

## 2022-02-13 MED ORDER — ACETAMINOPHEN 325 MG PO TABS
650.0000 mg | ORAL_TABLET | ORAL | Status: DC | PRN
  Administered 2022-02-13 (×2): 650 mg via ORAL
  Filled 2022-02-13 (×2): qty 2

## 2022-02-13 NOTE — Anesthesia Procedure Notes (Signed)
Epidural Patient location during procedure: OB Start time: 02/13/2022 3:07 PM End time: 02/13/2022 3:15 PM  Staffing Anesthesiologist: Pervis Hocking, DO Performed: anesthesiologist   Preanesthetic Checklist Completed: patient identified, IV checked, risks and benefits discussed, monitors and equipment checked, pre-op evaluation and timeout performed  Epidural Patient position: sitting Prep: DuraPrep and site prepped and draped Patient monitoring: continuous pulse ox, blood pressure, heart rate and cardiac monitor Approach: midline Location: L3-L4 Injection technique: LOR air  Needle:  Needle type: Tuohy  Needle gauge: 17 G Needle length: 9 cm Needle insertion depth: 5.5 cm Catheter type: closed end flexible Catheter size: 19 Gauge Catheter at skin depth: 11 cm Test dose: negative  Assessment Sensory level: T8 Events: blood not aspirated, injection not painful, no injection resistance, no paresthesia and negative IV test  Additional Notes Patient identified. Risks/Benefits/Options discussed with patient including but not limited to bleeding, infection, nerve damage, paralysis, failed block, incomplete pain control, headache, blood pressure changes, nausea, vomiting, reactions to medication both or allergic, itching and postpartum back pain. Confirmed with bedside nurse the patient's most recent platelet count. Confirmed with patient that they are not currently taking any anticoagulation, have any bleeding history or any family history of bleeding disorders. Patient expressed understanding and wished to proceed. All questions were answered. Sterile technique was used throughout the entire procedure. Please see nursing notes for vital signs. Test dose was given through epidural catheter and negative prior to continuing to dose epidural or start infusion. Warning signs of high block given to the patient including shortness of breath, tingling/numbness in hands, complete motor  block, or any concerning symptoms with instructions to call for help. Patient was given instructions on fall risk and not to get out of bed. All questions and concerns addressed with instructions to call with any issues or inadequate analgesia.  Reason for block:procedure for pain

## 2022-02-13 NOTE — Progress Notes (Signed)
Labor Progress Note Evalynne Locurto is a 21 y.o. G2P0010 at [redacted]w[redacted]d presented for IOL 2/2 polyhydramnios, HIV+  S: Having some itching otherwise no acute concerns.   O:  BP 97/64   Pulse 77   Temp 98.2 F (36.8 C) (Oral)   Resp 15   Ht 4\' 11"  (1.499 m)   Wt 79.3 kg   LMP  (LMP Unknown) Comment: a month ago.  + home pregnancy test on 3/24  SpO2 99%   BMI 35.31 kg/m  EFM: 120/moderate/+accels, variable with walchers  CVE: Dilation: 4.5 Effacement (%): 70 Cervical Position: Posterior Station: -2, -3 Presentation: Vertex Exam by:: Dr. Janus Molder  A&P: 21 y.o. G2P0010 [redacted]w[redacted]d here  #Labor: Unchanged. Full 13 mins of walchers with provider at bedside. Placed back in throne. Plan to AROM and place IUPC when active labor and/or fetal position improves. Continue pit.  #Pain: Epidural #FWB: Cat II improved to I, walchers positioning #GBS positive, ppx  HIV + - Continue Zidovudine and biktarvy  HSV on suppression  Ida Uppal Autry-Lott, DO 9:33 PM

## 2022-02-13 NOTE — Anesthesia Preprocedure Evaluation (Signed)
Anesthesia Evaluation  Patient identified by MRN, date of birth, ID band Patient awake    Reviewed: Allergy & Precautions, Patient's Chart, lab work & pertinent test results  Airway Mallampati: II  TM Distance: >3 FB Neck ROM: Full    Dental no notable dental hx.    Pulmonary neg pulmonary ROS, former smoker   Pulmonary exam normal breath sounds clear to auscultation       Cardiovascular negative cardio ROS Normal cardiovascular exam Rhythm:Regular Rate:Normal     Neuro/Psych  Headaches PSYCHIATRIC DISORDERS  Depression       GI/Hepatic Neg liver ROS,GERD  Controlled,,  Endo/Other  negative endocrine ROS    Renal/GU negative Renal ROS  negative genitourinary   Musculoskeletal negative musculoskeletal ROS (+)    Abdominal   Peds negative pediatric ROS (+)  Hematology  (+) Blood dyscrasia, anemia , HIVHb 9.9, plt 283   Anesthesia Other Findings   Reproductive/Obstetrics (+) Pregnancy                             Anesthesia Physical Anesthesia Plan  ASA: 3  Anesthesia Plan: Epidural   Post-op Pain Management:    Induction:   PONV Risk Score and Plan: 2  Airway Management Planned: Natural Airway  Additional Equipment: None  Intra-op Plan:   Post-operative Plan:   Informed Consent: I have reviewed the patients History and Physical, chart, labs and discussed the procedure including the risks, benefits and alternatives for the proposed anesthesia with the patient or authorized representative who has indicated his/her understanding and acceptance.       Plan Discussed with:   Anesthesia Plan Comments:        Anesthesia Quick Evaluation

## 2022-02-13 NOTE — Progress Notes (Signed)
Labor Progress Note  Alyssa Sandoval is a 21 y.o. G2P0010 at [redacted]w[redacted]d presented for IOL poly  S: Pt feeling her contractions s/p cytotec  O:  BP (!) 126/92   Pulse 80   Ht 4\' 11"  (1.499 m)   Wt 79.3 kg   LMP  (LMP Unknown) Comment: a month ago.  + home pregnancy test on 3/24  BMI 35.31 kg/m  EFM: 135 bpm/Moderate variability/ 15x15 accels/ None decels  CVE: Dilation: 3 Effacement (%): 60 Station: -2, -3 Presentation: Vertex Exam by:: Dr. Dorena Cookey   A&P: 21 y.o. G2P0010 [redacted]w[redacted]d  here for IOL as above  #Labor: Progressing well. FB inserted #Pain: Nitrous oxide, Family/Friend support, and PO/IV pain meds #FWB: CAT 1 #GBS positive- PCN ongoing #HIV- AZT loading dose done, continuous infusion ongoing  Shelda Pal, DO FMOB Fellow, Faculty practice [place] for [facility] 02/13/22  6:44 AM

## 2022-02-13 NOTE — H&P (Signed)
LABOR AND DELIVERY ADMISSION HISTORY AND PHYSICAL NOTE  Alyssa Sandoval is a 21 y.o. female G2P0010 with IUP at [redacted]w[redacted]d by 9 wk [place] presenting for IOL for polyhydramnios, HIV+.   She reports positive fetal movement. She denies leakage of fluid or vaginal bleeding. Denies HA, vision changes, RUQ pain, CP, SOB.  Prenatal History/Complications: PNC at [facility] Pregnancy complications:  - HIV + on Biktarvy - HSV on suppression - GBS bacteruria - Polyhydramnios - Third trimester UTI  Past Medical History: Past Medical History:  Diagnosis Date   Chronic constipation 06/10/2012   COVID-19 04/2020   Depression    Dysfunctional uterine bleeding    GERD (gastroesophageal reflux disease) 06/10/2012   Headache 12/09/2020   High-risk pregnancy 07/13/2021   HIV (human immunodeficiency virus infection) ([place])    HIV (human immunodeficiency virus infection) ([place])    HSV (herpes simplex virus) anogenital infection    Low back pain 12/09/2020   Passive suicidal ideations 11/07/2020   Vaginal pruritus 03/15/2021   Vaginal yeast infection     Past Surgical History: Past Surgical History:  Procedure Laterality Date   NO PAST SURGERIES      Obstetrical History: OB History     Gravida  2   Para  0   Term  0   Preterm  0   AB  1   Living  0      SAB  1   IAB  0   Ectopic  0   Multiple  0   Live Births  0           Social History: Social History   Socioeconomic History   Marital status: Single    Spouse name: Not on file   Number of children: Not on file   Years of education: Not on file   Highest education level: Not on file  Occupational History   Not on file  Tobacco Use   Smoking status: Former    Types: Cigars, E-cigarettes   Smokeless tobacco: Never   Tobacco comments:    vapes  Vaping Use   Vaping Use: Never used  Substance and Sexual Activity   Alcohol use: No   Drug use: Not Currently    Types: Marijuana    Comment: more than a year ago    Sexual activity: Yes    Partners: Male    Birth control/protection: None    Comment: declined condoms  Other Topics Concern   Not on file  Social History Narrative   Marital status/children/pets: Single.    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      Social Determinants of Health   Financial Resource Strain: Low Risk  (08/09/2021)   Overall Financial Resource Strain (CARDIA)    Difficulty of Paying Living Expenses: Not hard at all  Food Insecurity: No Food Insecurity (02/13/2022)   Hunger Vital Sign    Worried About Running Out of Food in the Last Year: Never true    Ran Out of Food in the Last Year: Never true  Transportation Needs: No Transportation Needs (02/13/2022)   PRAPARE - Hydrologist (Medical): No    Lack of Transportation (Non-Medical): No  Physical Activity: Inactive (08/09/2021)   Exercise Vital Sign    Days of Exercise per Week: 0 days    Minutes of Exercise per Session: 0 min  Stress: No Stress Concern Present (08/09/2021)   [place]  Feeling of Stress : Only a little  Social Connections: Socially Integrated (08/09/2021)   Social Connection and Isolation Panel [NHANES]    Frequency of Communication with Friends and Family: More than three times a week    Frequency of Social Gatherings with Friends and Family: Never    Attends Religious Services: 1 to 4 times per year    Active Member of [employer] or Organizations: Yes    Attends Banker Meetings: Never    Marital Status: Living with partner    Family History: Family History  Problem Relation Age of Onset   Obesity Mother    Fibromyalgia Mother    Mental illness Mother    Hypertension Maternal Grandmother    Colon cancer Maternal Grandfather     Allergies: No Known Allergies  Medications Prior to Admission  Medication Sig Dispense Refill Last Dose    acetaminophen (TYLENOL) 650 MG CR tablet Take 650 mg by mouth every 8 (eight) hours as needed for pain.   Past Month   acyclovir (ZOVIRAX) 400 MG tablet Take 1 tablet (400 mg total) by mouth 3 (three) times daily. 90 tablet 3 02/12/2022   aspirin EC 81 MG tablet Take 1 tablet (81 mg total) by mouth daily. Swallow whole. 30 tablet 11 Past Week   bictegravir-emtricitabine-tenofovir AF (BIKTARVY) 50-200-25 MG TABS tablet Take 1 tablet by mouth daily. 30 tablet 11 02/12/2022   cyclobenzaprine (FLEXERIL) 5 MG tablet Take 1 tablet (5 mg total) by mouth at bedtime as needed for muscle spasms. 30 tablet 0 Past Month   Phenyleph-CPM-DM-APAP (TYLENOL COLD MULTI-SYMPTOM PO) Take 2 tablets by mouth every 4 (four) hours as needed.   Past Month   Prenatal Vit-Fe Fumarate-FA (PRENATAL MULTIVITAMIN) TABS tablet Take 1 tablet by mouth daily at 12 noon.   02/12/2022   escitalopram (LEXAPRO) 5 MG tablet Take 1 tablet (5 mg total) by mouth daily. 30 tablet 4 Unknown   NIFEdipine (PROCARDIA) 10 MG capsule Take 1 capsule (10 mg total) by mouth 3 (three) times daily as needed (for regular painful contractions). 30 capsule 0 Unknown   nystatin-triamcinolone (MYCOLOG II) cream Apply to affected area daily 15 g 0 More than a month     Review of Systems  All systems reviewed and negative except as stated in HPI  Physical Exam Blood pressure 123/88, pulse 85, temperature 98.2 F (36.8 C), temperature source Oral, resp. rate 16, height 4\' 11"  (1.499 m), weight 79.3 kg. General appearance: alert, oriented, NAD Lungs: normal respiratory effort Heart: good peripheral perfusion Abdomen: soft, non-tender; gravid, FH slightly larger for GA Extremities: No calf swelling or tenderness Presentation: cephalic Fetal monitoring: 130/moderate variability/accels/no decels Uterine activity: Irritability Dilation: 3.5 Effacement (%): 60 Station: -3 Exam by:: Savannah brown RNSpeculum exam: No lesions  Prenatal labs: ABO, Rh:  --/--/AB POS (11/07 0024)  Rubella: 2.62 (05/03 1712) RPR: Non Reactive (08/22 0828)  HBsAg: Negative (05/03 1712)  HIV: Non-reactive (09/12 0000)  GC/Chlamydia: Negative GBS: Positive/-- (10/18 1511)  2-hr GTT: Passed Genetic screening:  NT/IT: normal NT, declined 2nd IT Panorama: LR female  Anatomy US:   [place] 21+6 wks,cephalic,anterior placenta gr 0,normal ovaries,CX 3.1 cm,SVP of fluid 6.1 cm,FHR 144 bpm,2 LVEICF,1 RVEICF,EFW 500 g 71%   Most recent US: [place] 37+6 wks,cephalic,FHR 140 bpm,BPP 8/8,anterior placenta gr 3,AFI 31.4 cm,polyhydramnios  Prenatal Transfer Tool  Maternal Diabetes: No Genetic Screening: Normal Maternal Ultrasounds/Referrals: Other:Polyhydramnios Fetal Ultrasounds or other Referrals:  None Maternal Substance Abuse:  Not in pregnancy Significant Maternal Medications:  Meds include: Other: Biktarvy, acyclovir Significant Maternal Lab Results: Group B Strep positive, HIV positive, and Other: HSV+  Results for orders placed or performed during the hospital encounter of 02/12/22 (from the past 24 hour(s))  CBC   Collection Time: 02/13/22 12:24 AM  Result Value Ref Range   WBC 13.4 (H) 4.0 - 10.5 K/uL   RBC 3.96 3.87 - 5.11 MIL/uL   Hemoglobin 9.9 (L) 12.0 - 15.0 g/dL   HCT 31.2 (L) 36.0 - 46.0 %   MCV 78.8 (L) 80.0 - 100.0 fL   MCH 25.0 (L) 26.0 - 34.0 pg   MCHC 31.7 30.0 - 36.0 g/dL   RDW 22.5 (H) 11.5 - 15.5 %   Platelets 283 150 - 400 K/uL   nRBC 0.1 0.0 - 0.2 %  Type and screen   Collection Time: 02/13/22 12:24 AM  Result Value Ref Range   ABO/RH(D) AB POS    Antibody Screen NEG    Sample Expiration      02/16/2022,2359 Performed at [facility], [address] [place], [place] [address]     Patient Active Problem List   Diagnosis Date Noted   Polyhydramnios 02/13/2022   HIV positive ([place]) 01/29/2022   Positive testing for group B Streptococcus 01/27/2022   Polyhydramnios in third trimester 01/24/2022   Pyelonephritis 08/19/2021   HSV  (herpes simplex virus) anogenital infection 08/09/2021   High-risk pregnancy, second trimester 08/08/2021   Headache 12/09/2020   Low back pain 12/09/2020   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   Passive suicidal ideations 11/07/2020   HIV disease ([place]) 11/01/2020    Assessment: Ciena Sampley is a 21 y.o. G2P0010 at [redacted]w[redacted]d here for IOL due to polyhydramnios, HIV  #Labor: Pt is closed/thick/high. Will start Cytotec PO/Vag 50/25 and reassess in 4 hrs #Pain: Open to nitrous, IV pain medications, and epidural as last resort #FWB: Cat 1 #ID:  GBS+ > PCN   HIV+ > AZT   HSV+ on suppression, no lesions on exam   Declined Tdap and flu prenatally   Rubella immune   Third trimester UTI: Received Tx, will do test of cure urine cx #MOF: Pumping if undetectable viral load #MOC: Post-placental Mirena, consented #Circ:  Yes  Wilhemina Cash 02/13/2022, 8:33 AM ___

## 2022-02-13 NOTE — Progress Notes (Addendum)
LABOR AND DELIVERY ADMISSION HISTORY AND PHYSICAL NOTE  Alyssa Sandoval is a 21 y.o. female G2P0010 with IUP at [redacted]w[redacted]d by 9 wk [place] presenting for IOL for polyhydramnios, HIV+.   She reports positive fetal movement. She denies leakage of fluid or vaginal bleeding. Denies HA, vision changes, RUQ pain, CP, SOB.  Prenatal History/Complications: PNC at [facility] Pregnancy complications:  - HIV + on Biktarvy - HSV on suppression - GBS bacteruria - Polyhydramnios - Third trimester UTI  Past Medical History: Past Medical History:  Diagnosis Date   Chronic constipation 06/10/2012   COVID-19 04/2020   Depression    Dysfunctional uterine bleeding    GERD (gastroesophageal reflux disease) 06/10/2012   Headache 12/09/2020   High-risk pregnancy 07/13/2021   HIV (human immunodeficiency virus infection) ([place])    HIV (human immunodeficiency virus infection) ([place])    HSV (herpes simplex virus) anogenital infection    Low back pain 12/09/2020   Passive suicidal ideations 11/07/2020   Vaginal pruritus 03/15/2021   Vaginal yeast infection     Past Surgical History: Past Surgical History:  Procedure Laterality Date   NO PAST SURGERIES      Obstetrical History: OB History     Gravida  2   Para  0   Term  0   Preterm  0   AB  1   Living  0      SAB  1   IAB  0   Ectopic  0   Multiple  0   Live Births  0           Social History: Social History   Socioeconomic History   Marital status: Single    Spouse name: Not on file   Number of children: Not on file   Years of education: Not on file   Highest education level: Not on file  Occupational History   Not on file  Tobacco Use   Smoking status: Former    Types: Cigars, E-cigarettes   Smokeless tobacco: Never   Tobacco comments:    vapes  Vaping Use   Vaping Use: Never used  Substance and Sexual Activity   Alcohol use: No   Drug use: Not Currently    Types: Marijuana    Comment: more than a year ago    Sexual activity: Yes    Partners: Male    Birth control/protection: None    Comment: declined condoms  Other Topics Concern   Not on file  Social History Narrative   Marital status/children/pets: Single.    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      Social Determinants of Health   Financial Resource Strain: Low Risk  (08/09/2021)   Overall Financial Resource Strain (CARDIA)    Difficulty of Paying Living Expenses: Not hard at all  Food Insecurity: No Food Insecurity (02/13/2022)   Hunger Vital Sign    Worried About Running Out of Food in the Last Year: Never true    Ran Out of Food in the Last Year: Never true  Transportation Needs: No Transportation Needs (02/13/2022)   PRAPARE - Hydrologist (Medical): No    Lack of Transportation (Non-Medical): No  Physical Activity: Inactive (08/09/2021)   Exercise Vital Sign    Days of Exercise per Week: 0 days    Minutes of Exercise per Session: 0 min  Stress: No Stress Concern Present (08/09/2021)   [place]  Feeling of Stress : Only a little  Social Connections: Socially Integrated (08/09/2021)   Social Connection and Isolation Panel [NHANES]    Frequency of Communication with Friends and Family: More than three times a week    Frequency of Social Gatherings with Friends and Family: Never    Attends Religious Services: 1 to 4 times per year    Active Member of [employer] or Organizations: Yes    Attends Banker Meetings: Never    Marital Status: Living with partner    Family History: Family History  Problem Relation Age of Onset   Obesity Mother    Fibromyalgia Mother    Mental illness Mother    Hypertension Maternal Grandmother    Colon cancer Maternal Grandfather     Allergies: No Known Allergies  Medications Prior to Admission  Medication Sig Dispense Refill Last Dose    acetaminophen (TYLENOL) 650 MG CR tablet Take 650 mg by mouth every 8 (eight) hours as needed for pain.   Past Month   acyclovir (ZOVIRAX) 400 MG tablet Take 1 tablet (400 mg total) by mouth 3 (three) times daily. 90 tablet 3 02/12/2022   aspirin EC 81 MG tablet Take 1 tablet (81 mg total) by mouth daily. Swallow whole. 30 tablet 11 Past Week   bictegravir-emtricitabine-tenofovir AF (BIKTARVY) 50-200-25 MG TABS tablet Take 1 tablet by mouth daily. 30 tablet 11 02/12/2022   cyclobenzaprine (FLEXERIL) 5 MG tablet Take 1 tablet (5 mg total) by mouth at bedtime as needed for muscle spasms. 30 tablet 0 Past Month   Phenyleph-CPM-DM-APAP (TYLENOL COLD MULTI-SYMPTOM PO) Take 2 tablets by mouth every 4 (four) hours as needed.   Past Month   Prenatal Vit-Fe Fumarate-FA (PRENATAL MULTIVITAMIN) TABS tablet Take 1 tablet by mouth daily at 12 noon.   02/12/2022   escitalopram (LEXAPRO) 5 MG tablet Take 1 tablet (5 mg total) by mouth daily. 30 tablet 4 Unknown   NIFEdipine (PROCARDIA) 10 MG capsule Take 1 capsule (10 mg total) by mouth 3 (three) times daily as needed (for regular painful contractions). 30 capsule 0 Unknown   nystatin-triamcinolone (MYCOLOG II) cream Apply to affected area daily 15 g 0 More than a month     Review of Systems  All systems reviewed and negative except as stated in HPI  Physical Exam Height 4\' 11"  (1.499 m), weight 79.3 kg. General appearance: alert, oriented, NAD Lungs: normal respiratory effort Heart: good peripheral perfusion Abdomen: soft, non-tender; gravid, FH slightly larger for GA Extremities: No calf swelling or tenderness Presentation: cephalic Fetal monitoring: 130/moderate variability/accels/no decels Uterine activity: Irritability  Speculum exam: No lesions  Prenatal labs: ABO, Rh: --/--/AB POS (11/07 0024)  Rubella: 2.62 (05/03 1712) RPR: Non Reactive (08/22 0828)  HBsAg: Negative (05/03 1712)  HIV: Non-reactive (09/12 0000)  GC/Chlamydia: Negative GBS:  Positive/-- (10/18 1511)  2-hr GTT: Passed Genetic screening:  NT/IT: normal NT, declined 2nd IT Panorama: LR female  Anatomy 12-22-1993:   [place] 21+6 wks,cephalic,anterior placenta gr 0,normal ovaries,CX 3.1 cm,SVP of fluid 6.1 cm,FHR 144 bpm,2 LVEICF,1 RVEICF,EFW 500 g 71%   Most recent US: [place] 37+6 wks,cephalic,FHR 140 bpm,BPP 8/8,anterior placenta gr 3,AFI 31.4 cm,polyhydramnios  Prenatal Transfer Tool  Maternal Diabetes: No Genetic Screening: Normal Maternal Ultrasounds/Referrals: Other:Polyhydramnios Fetal Ultrasounds or other Referrals:  None Maternal Substance Abuse:  Not in pregnancy Significant Maternal Medications:  Meds include: Other: Biktarvy, acyclovir Significant Maternal Lab Results: Group B Strep positive, HIV positive, and Other: HSV+  Results for orders placed or performed  during the hospital encounter of 02/12/22 (from the past 24 hour(s))  CBC   Collection Time: 02/13/22 12:24 AM  Result Value Ref Range   WBC 13.4 (H) 4.0 - 10.5 K/uL   RBC 3.96 3.87 - 5.11 MIL/uL   Hemoglobin 9.9 (L) 12.0 - 15.0 g/dL   HCT 90.3 (L) 00.9 - 23.3 %   MCV 78.8 (L) 80.0 - 100.0 fL   MCH 25.0 (L) 26.0 - 34.0 pg   MCHC 31.7 30.0 - 36.0 g/dL   RDW 00.7 (H) 62.2 - 63.3 %   Platelets 283 150 - 400 K/uL   nRBC 0.1 0.0 - 0.2 %  Type and screen   Collection Time: 02/13/22 12:24 AM  Result Value Ref Range   ABO/RH(D) AB POS    Antibody Screen NEG    Sample Expiration      02/16/2022,2359 Performed at [facility], [address], [place], [place] [address]     Patient Active Problem List   Diagnosis Date Noted   Polyhydramnios 02/13/2022   HIV positive ([facility]) 01/29/2022   Positive testing for group B Streptococcus 01/27/2022   Polyhydramnios in third trimester 01/24/2022   Pyelonephritis 08/19/2021   HSV (herpes simplex virus) anogenital infection 08/09/2021   High-risk pregnancy, second trimester 08/08/2021   Headache 12/09/2020   Low back pain 12/09/2020   Major depression  11/07/2020   Passive suicidal ideations 11/07/2020   HIV disease ([facility]) 11/01/2020    Assessment: Alyssa Sandoval is a 21 y.o. G2P0010 at [redacted]w[redacted]d here for IOL due to polyhydramnios, HIV  #Labor: Pt is closed/thick/high. Will start Cytotec PO/Vag 50/25 and reassess in 4 hrs #Pain: Open to nitrous, IV pain medications, and epidural as last resort #FWB: Cat 1 #ID:  GBS+ > PCN   HIV+ > AZT   HSV+ on suppression, no lesions on exam   Declined Tdap and flu prenatally   Rubella immune   Third trimester UTI: Received Tx, will do test of cure urine cx #MOF: Pumping if undetectable viral load #MOC: Post-placental Mirena, consented #Circ:  Yes  Madeleine Mendelow 02/13/2022, 1:32 AM ___ GME ATTESTATION:  Evaluation and management procedures were performed by the [facility] Medicine Resident under my supervision. I was immediately available for direct supervision, assistance and direction throughout this encounter.  I also confirm that I have verified the information documented in the resident's note, and that I have also personally reperformed the pertinent components of the physical exam and all of the medical decision making activities.  I have also made any necessary editorial changes.  Myrtie Hawk, DO OB Fellow, Faculty [facility], Center for [facility] 02/13/2022 4:58 AM

## 2022-02-14 ENCOUNTER — Encounter (HOSPITAL_COMMUNITY): Payer: Self-pay | Admitting: Obstetrics and Gynecology

## 2022-02-14 DIAGNOSIS — O99820 Streptococcus B carrier state complicating pregnancy: Secondary | ICD-10-CM

## 2022-02-14 DIAGNOSIS — O403XX0 Polyhydramnios, third trimester, not applicable or unspecified: Secondary | ICD-10-CM

## 2022-02-14 DIAGNOSIS — O9832 Other infections with a predominantly sexual mode of transmission complicating childbirth: Secondary | ICD-10-CM

## 2022-02-14 DIAGNOSIS — Z30430 Encounter for insertion of intrauterine contraceptive device: Secondary | ICD-10-CM

## 2022-02-14 DIAGNOSIS — O165 Unspecified maternal hypertension, complicating the puerperium: Secondary | ICD-10-CM

## 2022-02-14 DIAGNOSIS — Z3A39 39 weeks gestation of pregnancy: Secondary | ICD-10-CM

## 2022-02-14 DIAGNOSIS — O9872 Human immunodeficiency virus [HIV] disease complicating childbirth: Secondary | ICD-10-CM

## 2022-02-14 LAB — DIC (DISSEMINATED INTRAVASCULAR COAGULATION)PANEL
D-Dimer, Quant: 2.49 ug/mL-FEU — ABNORMAL HIGH (ref 0.00–0.50)
Fibrinogen: 362 mg/dL (ref 210–475)
INR: 1.3 — ABNORMAL HIGH (ref 0.8–1.2)
Platelets: 228 10*3/uL (ref 150–400)
Prothrombin Time: 15.7 seconds — ABNORMAL HIGH (ref 11.4–15.2)
Smear Review: NONE SEEN
aPTT: 31 seconds (ref 24–36)

## 2022-02-14 LAB — CBC
HCT: 23.3 % — ABNORMAL LOW (ref 36.0–46.0)
Hemoglobin: 7.2 g/dL — ABNORMAL LOW (ref 12.0–15.0)
MCH: 25.2 pg — ABNORMAL LOW (ref 26.0–34.0)
MCHC: 30.9 g/dL (ref 30.0–36.0)
MCV: 81.5 fL (ref 80.0–100.0)
Platelets: 222 10*3/uL (ref 150–400)
RBC: 2.86 MIL/uL — ABNORMAL LOW (ref 3.87–5.11)
RDW: 23.3 % — ABNORMAL HIGH (ref 11.5–15.5)
WBC: 17.4 10*3/uL — ABNORMAL HIGH (ref 4.0–10.5)
nRBC: 0 % (ref 0.0–0.2)

## 2022-02-14 LAB — CULTURE, OB URINE

## 2022-02-14 LAB — POSTPARTUM HEMORRHAGE PROTOCOL (BB NOTIFICATION)

## 2022-02-14 MED ORDER — TRANEXAMIC ACID-NACL 1000-0.7 MG/100ML-% IV SOLN: 1000.0000 mg | INTRAVENOUS | Status: AC

## 2022-02-14 MED ORDER — OXYTOCIN-SODIUM CHLORIDE 30-0.9 UT/500ML-% IV SOLN: 10.0000 [IU]/h | INTRAVENOUS | Status: DC

## 2022-02-14 MED ORDER — TRANEXAMIC ACID-NACL 1000-0.7 MG/100ML-% IV SOLN: 1000.0000 mg | Freq: Once | INTRAVENOUS | Status: DC | PRN

## 2022-02-14 MED ORDER — LACTATED RINGERS IV SOLN: INTRAVENOUS | Status: DC

## 2022-02-14 MED ORDER — FENTANYL CITRATE (PF) 100 MCG/2ML IJ SOLN
INTRAMUSCULAR | Status: AC
  Administered 2022-02-14: 100 ug
  Filled 2022-02-14: qty 4

## 2022-02-14 MED ORDER — OXYTOCIN-SODIUM CHLORIDE 30-0.9 UT/500ML-% IV SOLN
INTRAVENOUS | Status: AC
  Filled 2022-02-14: qty 500

## 2022-02-14 MED ORDER — BENZOCAINE-MENTHOL 20-0.5 % EX AERO
1.0000 | INHALATION_SPRAY | CUTANEOUS | Status: DC | PRN
  Administered 2022-02-14 – 2022-02-16 (×2): 1 via TOPICAL
  Filled 2022-02-14 (×2): qty 56

## 2022-02-14 MED ORDER — OXYCODONE HCL 5 MG PO TABS
10.0000 mg | ORAL_TABLET | ORAL | Status: DC | PRN
  Administered 2022-02-14: 10 mg via ORAL
  Filled 2022-02-14: qty 2

## 2022-02-14 MED ORDER — ONDANSETRON HCL 4 MG/2ML IJ SOLN: 4.0000 mg | INTRAMUSCULAR | Status: DC | PRN

## 2022-02-14 MED ORDER — TRANEXAMIC ACID-NACL 1000-0.7 MG/100ML-% IV SOLN
INTRAVENOUS | Status: AC
  Administered 2022-02-14: 1000 mg
  Filled 2022-02-14: qty 100

## 2022-02-14 MED ORDER — MISOPROSTOL 200 MCG PO TABS
ORAL_TABLET | ORAL | Status: AC
  Filled 2022-02-14: qty 5

## 2022-02-14 MED ORDER — DIBUCAINE (PERIANAL) 1 % EX OINT: 1.0000 | TOPICAL_OINTMENT | CUTANEOUS | Status: DC | PRN

## 2022-02-14 MED ORDER — METHYLERGONOVINE MALEATE 0.2 MG/ML IJ SOLN
INTRAMUSCULAR | Status: AC
  Filled 2022-02-14: qty 3

## 2022-02-14 MED ORDER — IBUPROFEN 600 MG PO TABS
600.0000 mg | ORAL_TABLET | Freq: Four times a day (QID) | ORAL | Status: DC | PRN
  Administered 2022-02-14: 600 mg via ORAL
  Filled 2022-02-14: qty 1

## 2022-02-14 MED ORDER — PRENATAL MULTIVITAMIN CH
1.0000 | ORAL_TABLET | Freq: Every day | ORAL | Status: DC
  Administered 2022-02-15 – 2022-02-16 (×2): 1 via ORAL
  Filled 2022-02-14 (×2): qty 1

## 2022-02-14 MED ORDER — ACETAMINOPHEN 325 MG PO TABS
650.0000 mg | ORAL_TABLET | ORAL | Status: DC | PRN
  Administered 2022-02-14 – 2022-02-16 (×7): 650 mg via ORAL
  Filled 2022-02-14 (×7): qty 2

## 2022-02-14 MED ORDER — CARBOPROST TROMETHAMINE 250 MCG/ML IM SOLN
INTRAMUSCULAR | Status: AC
  Filled 2022-02-14: qty 1

## 2022-02-14 MED ORDER — ONDANSETRON HCL 4 MG PO TABS: 4.0000 mg | ORAL_TABLET | ORAL | Status: DC | PRN

## 2022-02-14 MED ORDER — OXYCODONE HCL 5 MG PO TABS
5.0000 mg | ORAL_TABLET | ORAL | Status: DC | PRN
  Administered 2022-02-15 (×2): 5 mg via ORAL
  Filled 2022-02-14 (×2): qty 1

## 2022-02-14 MED ORDER — ZOLPIDEM TARTRATE 5 MG PO TABS: 5.0000 mg | ORAL_TABLET | Freq: Every evening | ORAL | Status: DC | PRN

## 2022-02-14 MED ORDER — METHYLERGONOVINE MALEATE 0.2 MG/ML IJ SOLN
INTRAMUSCULAR | Status: AC
  Filled 2022-02-14: qty 1

## 2022-02-14 MED ORDER — SIMETHICONE 80 MG PO CHEW: 80.0000 mg | CHEWABLE_TABLET | ORAL | Status: DC | PRN

## 2022-02-14 MED ORDER — ALBUMIN HUMAN 5 % IV SOLN
12.5000 g | Freq: Once | INTRAVENOUS | Status: AC
  Administered 2022-02-14: 12.5 g via INTRAVENOUS

## 2022-02-14 MED ORDER — TRANEXAMIC ACID-NACL 1000-0.7 MG/100ML-% IV SOLN: 1000.0000 mg | INTRAVENOUS | Status: DC

## 2022-02-14 MED ORDER — LACTATED RINGERS IV BOLUS
1000.0000 mL | Freq: Once | INTRAVENOUS | Status: AC
  Administered 2022-02-14: 1000 mL via INTRAVENOUS

## 2022-02-14 MED ORDER — TETANUS-DIPHTH-ACELL PERTUSSIS 5-2.5-18.5 LF-MCG/0.5 IM SUSY: 0.5000 mL | PREFILLED_SYRINGE | Freq: Once | INTRAMUSCULAR | Status: DC

## 2022-02-14 MED ORDER — HYDROXYZINE HCL 25 MG PO TABS
25.0000 mg | ORAL_TABLET | Freq: Four times a day (QID) | ORAL | Status: DC | PRN
  Administered 2022-02-14: 25 mg via ORAL
  Filled 2022-02-14: qty 1

## 2022-02-14 MED ORDER — LACTATED RINGERS IV BOLUS: 1000.0000 mL | Freq: Once | INTRAVENOUS | Status: DC

## 2022-02-14 MED ORDER — SENNOSIDES-DOCUSATE SODIUM 8.6-50 MG PO TABS
2.0000 | ORAL_TABLET | Freq: Every day | ORAL | Status: DC
  Administered 2022-02-15 – 2022-02-16 (×2): 2 via ORAL
  Filled 2022-02-14 (×2): qty 2

## 2022-02-14 MED ORDER — TRANEXAMIC ACID-NACL 1000-0.7 MG/100ML-% IV SOLN
INTRAVENOUS | Status: AC
  Filled 2022-02-14: qty 100

## 2022-02-14 MED ORDER — COCONUT OIL OIL: 1.0000 | TOPICAL_OIL | Status: DC | PRN

## 2022-02-14 MED ORDER — WITCH HAZEL-GLYCERIN EX PADS: 1.0000 | MEDICATED_PAD | CUTANEOUS | Status: DC | PRN

## 2022-02-14 MED ORDER — DIPHENHYDRAMINE HCL 25 MG PO CAPS: 25.0000 mg | ORAL_CAPSULE | Freq: Four times a day (QID) | ORAL | Status: DC | PRN

## 2022-02-14 NOTE — Progress Notes (Signed)
Chaplain paged to code hemorrhage. Code stabilized when chaplain arrived. Chaplain asked open ended questions to facilitate story telling. Alyssa Sandoval acknowledged that things were scary for a moment, but reports she feels okay now-just tired. FOB said, "I'm okay as long as she's okay." The couple are enjoying their new son, Ace, who is their first born. Chaplain normalized the variety of feelings that accompany the birth of a baby, particularly after medical events that can feel traumatic and encouraged them to reach out for continued support as needed.  Please page as further needs arise.  Maryanna Shape. Carley Hammed, M.Div. [facility] Chaplain Pager [phone] Office [phone]

## 2022-02-14 NOTE — Significant Event (Signed)
Called to patient's bedside by Code Hemorrhage.   Upon arrival patient's EBL was likely around 400 by QBL. IV access quickly obtained by anethesia team. Coags and CBC sent. T&C placed but hold on transfusion. She was given fluid bolus and then 250 cc of 5% albumin. BP initially was hypotensive without tachycardia and she responded quickly to our interventions.   I called for Pitocin, TXA and fentanyl as well as having Methergine in the room. I did bimanual exam - most clots already out from fundal massage and pressure. Bladder felt full so catheter placed. During my bimanual exam, I removed the ppIUD. I informed pt of this. Once foley was in, urine was about 600 cc. Ultimately, she was given pitocin and TXA - methergine not given. Two additional fundal checks performed and fundus was firm at umbilicus and minimal bleeding on bimanual exam.   Suspected postpartum hemorrhage due to atony from bladder being full - now resolved.   Will follow up on coags and CBC. If blood is indicated, she and I discussed it and she would consent to transfusion if indicated.   Prior to delivery HgB was 9.6 and she lost 532 at delivery.  QBL from this hemorrhage is 1106.   Overnight team informed of events and items to follow up on.   Milas Hock, MD Attending Obstetrician & Gynecologist, [facility] for [facility], [facility]

## 2022-02-14 NOTE — Progress Notes (Signed)
SVD baby boy skin to skin with mother 

## 2022-02-14 NOTE — Progress Notes (Addendum)
Labor Progress Note Alyssa Sandoval is a 20 y.o. G2P0010 at [redacted]w[redacted]d presented for IOL 2/2 polyhydramnios, HIV+  S: No acute concerns.   O:  BP 104/72   Pulse (!) 102   Temp 97.9 F (36.6 C) (Oral)   Resp 12   Ht 4\' 11"  (1.499 m)   Wt 79.3 kg   LMP  (LMP Unknown) Comment: a month ago.  + home pregnancy test on 3/24  SpO2 99%   BMI 35.31 kg/m  EFM: 125/moderate/+accels, possible early decels  CVE: Dilation: 5 Effacement (%): 70 Cervical Position: Posterior Station: -2 Presentation: Vertex Exam by:: Dr. [email or web address]  A&P: 21 y.o. G2P0010 [redacted]w[redacted]d here  #Labor: Some change with bulging bag, AROM, copious clear fluid. Fetal head well applied. On 20 of pitocin. Now tracing contractions well.  #Pain: Epidural #FWB: Cat I #GBS positive, ppx  HIV + - Continue Zidovudine and biktarvy  HSV not on suppression therapy    Jon Autry-Lott, DO 1:56 AM

## 2022-02-14 NOTE — Progress Notes (Signed)
Labor Progress Note Alyssa Sandoval is a 21 y.o. G2P0010 at [redacted]w[redacted]d presented for IOL 2/2 polyhydramnios, HIV+  S: No acute concerns.   O:  BP 126/86   Pulse 89   Temp 97.8 F (36.6 C) (Oral)   Resp 15   Ht 4\' 11"  (1.499 m)   Wt 79.3 kg   LMP  (LMP Unknown) Comment: a month ago.  + home pregnancy test on 3/24  SpO2 99%   BMI 35.31 kg/m  EFM: 130/moderate/+accels, no decels  CVE: Dilation: 10 Dilation Complete Date: 02/14/22 Dilation Complete Time: 0625 Effacement (%): 100 Cervical Position: Posterior Station: 0 Presentation: Vertex Exam by:: Dr. [email or web address]  A&P: 21 y.o. G2P0010 [redacted]w[redacted]d here  #Labor: Labor down. Expectant management #Pain: Epidural #FWB: Cat I #GBS positive, ppx  HIV + - Continue Zidovudine and biktarvy  HSV not on suppression therapy   [name], DO 7:41 AM

## 2022-02-14 NOTE — Progress Notes (Signed)
After Code Hemorrhage, pt significant other informed me that pt had actually fell to the floor when she was getting up to the restroom, after the large amount of blood came out at once, causing her to get dizzy. Pt stated she fell down on her side hip but he was able to get her up. Upon coming into the room pt was noted to be sitting off the side of the bed. Pt is not c/o any injury at this time due to the fall.

## 2022-02-14 NOTE — Progress Notes (Signed)
Alyssa Sandoval is a 21 y.o. 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    Subjective: Patient actively feeling the urge to push. Epidural is working well for the patient. Introductions exchanged, and patient would like to begin pushing.    Objective: BP (!) 128/92   Pulse 84   Temp 98.2 F (36.8 C)   Resp 17   Ht 4\' 11"  (1.499 m)   Wt 79.3 kg   LMP  (LMP Unknown) Comment: a month ago.  + home pregnancy test on 3/24  SpO2 99%   BMI 35.31 kg/m  I/O last 3 completed shifts: In: 0  Out: 1150 [Urine:1150] No intake/output data recorded.  FHT:  FHR: 135 bpm, variability: moderate,  accelerations:  Abscent,  decelerations:  Present early decels. UC:   regular, every 3-4 minutes SVE:   Dilation: 10 Effacement (%): 100 Station: +2 Exam by: S. [facility] CNM   Labs: Lab Results  Component Value Date   WBC 13.4 (H) 02/13/2022   HGB 9.9 (L) 02/13/2022   HCT 31.2 (L) 02/13/2022   MCV 78.8 (L) 02/13/2022   PLT 283 02/13/2022    Assessment / Plan: Induction of labor due to polyhydramnios,  progressing well on pitocin. S/p Cytotec, FB Pit and AROM. Patient Complete and +2.   Labor:  Patient progressed to complete. +2 Station and feeling pressure.  Fetal Wellbeing:  Category II- continue to monitor for signs of fetal distress.  Pain Control:  Epidural I/D:   GBS positive < PCN and HIV on Biktarvy VL-30 5 days ago, Repeat VL at delivery. HSV without active lesions, on acyclovir. No active lesions.    Anticipated MOD:  NSVD  13/10/2021, CNM 02/14/2022, 9:19 AM

## 2022-02-14 NOTE — Progress Notes (Signed)
Called into the room when patient was getting up for the second time to go to the restroom to empty bladder.  Upon entering the room pt was up against the bed and there was a large amount of blood and clots sitting in the floor.  Assisted back in bed and pt appeared pale and diaphoretic.  BP 75/48, pulse 86 Code Hemorrhage activated and team came into room to assist.  LR bolus started along with TXA, pitocin, and fentanyl given before manual sweep of the uterus was performed per Dr Para March.  IUD was noted to have been removed during the uterine sweep. Bleeding afterwards remained small and uterus firm U/E. Pt stabilized at that time and hemorrhage debrief performed at desk afterwards.

## 2022-02-14 NOTE — Progress Notes (Signed)
First push, CNM at [facility], labial separation noted

## 2022-02-14 NOTE — Progress Notes (Signed)
Large crown when pushing, CNM at [facility] for delivery

## 2022-02-14 NOTE — Discharge Summary (Addendum)
Postpartum Discharge Summary      Patient Name: Maurina Fawaz DOB: 11-17-2000 MRN: [id number]  Date of admission: 02/12/2022 Delivery date:02/14/2022  Delivering provider: Deloris Ping  Date of discharge: 02/16/2022  Admitting diagnosis: Polyhydramnios [O40.9XX0] Intrauterine pregnancy: [redacted]w[redacted]d    Secondary diagnosis:  Principal Problem:   Vaginal delivery Active Problems:   HIV disease (HFoster   Major depression   High-risk pregnancy, second trimester   HSV (herpes simplex virus) anogenital infection   Pyelonephritis   Polyhydramnios in third trimester   Positive testing for group B Streptococcus   Polyhydramnios   Postpartum hemorrhage  Additional problems: HSV+, HIV+, elevated blood pressure in postpartum period after receiving blood transfusion    Discharge diagnosis: Term Pregnancy [place] and [place]                                             Post partum procedures: Mirena IUD placed, but came out with PPH. Does not have a LARC at discharge  Augmentation: AROM, Pitocin, Cytotec, and IP Foley Complications: None  Hospital course: Induction of Labor With Vaginal Delivery   21y.o. yo G2P0010 at 319w1das admitted to the hospital 02/12/2022 for induction of labor.  Indication for induction:  Polyhydramnios  .  Patient had an labor course uncomplicated Membrane Rupture Time/Date: 1:38 AM ,02/14/2022   Delivery Method:Vaginal, Spontaneous  Episiotomy: None  Lacerations: Bilateral Labial tears. Repaired  Labial  Details of delivery can be found in separate delivery note.  Patient had a postpartum course complicated by PPH, received 3u of blood . Patient is discharged home 02/16/22.  Newborn Data: Birth date:02/14/2022  Birth time:10:04 AM  Gender:Female  Living status:Living  Apgars:9 ,9  Weight:3220 g   Magnesium Sulfate received: No BMZ received: No Rhophylac:No MMR:N/A T-DaP: declined  Flu: declined  Transfusion:Yes  Physical exam  Vitals:   02/15/22 2058  02/16/22 0529 02/16/22 0910 02/16/22 1055  BP: 132/86 120/83 (!) 139/93 (!) 146/100  Pulse: 97 72 84   Resp: _0 Temp: 98.5 F (36.9 C) 98 F (36.7 C) 98.1 F (36.7 C)   TempSrc: Oral Oral Oral   SpO2: 98% 100% 100%   Weight:      Height:       General: alert, cooperative, and no distress Lochia: appropriate Uterine Fundus: firm Incision: N/A DVT Evaluation: No evidence of DVT seen on physical exam. No significant calf/ankle edema. Labs: Lab Results  Component Value Date   WBC 13.1 (H) 02/16/2022   HGB 9.9 (L) 02/16/2022   HCT 28.6 (L) 02/16/2022   MCV 80.8 02/16/2022   PLT 190 02/16/2022      Latest Ref Rng & Units 02/15/2022    7:25 PM  CMP  Glucose 70 - 99 mg/dL 82   BUN 6 - 20 mg/dL <5   Creatinine 0.44 - 1.00 mg/dL 0.58   Sodium 135 - 145 mmol/L 138   Potassium 3.5 - 5.1 mmol/L 3.5   Chloride 98 - 111 mmol/L 106   CO2 22 - 32 mmol/L 22   Calcium 8.9 - 10.3 mg/dL 8.8   Total Protein 6.5 - 8.1 g/dL 5.1   Total Bilirubin 0.3 - 1.2 mg/dL 0.6   Alkaline Phos 38 - 126 U/L 93   AST 15 - 41 U/L 23   ALT 0 - 44 U/L 11  Edinburgh Score:    02/14/2022    1:46 PM  Edinburgh Postnatal Depression Scale Screening Tool  I have been able to laugh and see the funny side of things. 0  I have looked forward with enjoyment to things. 0  I have blamed myself unnecessarily when things went wrong. 0  I have been anxious or worried for no good reason. 0  I have felt scared or panicky for no good reason. 0  Things have been getting on top of me. 0  I have been so unhappy that I have had difficulty sleeping. 0  I have felt sad or miserable. 0  I have been so unhappy that I have been crying. 0  The thought of harming myself has occurred to me. 0  Edinburgh Postnatal Depression Scale Total 0     After visit meds:  Allergies as of 02/16/2022   No Known Allergies      Medication List     STOP taking these medications    aspirin EC 81 MG tablet   NIFEdipine  10 MG capsule Commonly known as: Procardia Replaced by: NIFEdipine 30 MG 24 hr tablet   nystatin-triamcinolone cream Commonly known as: MYCOLOG II       TAKE these medications    acetaminophen 650 MG CR tablet Commonly known as: TYLENOL Take 650 mg by mouth every 8 (eight) hours as needed for pain.   acyclovir 400 MG tablet Commonly known as: ZOVIRAX Take 1 tablet (400 mg total) by mouth 3 (three) times daily.   Biktarvy 50-200-25 MG Tabs tablet Generic drug: bictegravir-emtricitabine-tenofovir AF Take 1 tablet by mouth daily.   cyclobenzaprine 5 MG tablet Commonly known as: FLEXERIL Take 1 tablet (5 mg total) by mouth at bedtime as needed for muscle spasms.   escitalopram 5 MG tablet Commonly known as: LEXAPRO Take 1 tablet (5 mg total) by mouth daily.   FeroSul 325 (65 FE) MG tablet Generic drug: ferrous sulfate Take 1 tablet (325 mg total) by mouth every other day.   furosemide 20 MG tablet Commonly known as: LASIX Take 1 tablet (20 mg total) by mouth daily for 4 days.   ibuprofen 600 MG tablet Commonly known as: ADVIL Take 1 tablet (600 mg total) by mouth every 6 (six) hours as needed for moderate pain.   NIFEdipine 30 MG 24 hr tablet Commonly known as: PROCARDIA-XL/NIFEDICAL-XL Take 1 tablet (30 mg total) by mouth daily. Replaces: NIFEdipine 10 MG capsule   polyethylene glycol powder 17 GM/SCOOP powder Commonly known as: GLYCOLAX/MIRALAX Take 17 g by mouth daily as needed for mild constipation, moderate constipation or severe constipation.   prenatal multivitamin Tabs tablet Take 1 tablet by mouth daily at 12 noon.   TYLENOL COLD MULTI-SYMPTOM PO Take 2 tablets by mouth every 4 (four) hours as needed.         Discharge home in stable condition Infant Feeding:  Bottle Infant Disposition: Home with mom Discharge instruction: per After Visit Summary and Postpartum booklet. Activity: Advance as tolerated. Pelvic rest for 6 weeks.  Diet: routine  diet Future Appointments: Future Appointments  Date Time Provider [place]  03/14/2022 10:10 AM Myrtis Ser, CNM [facility]  03/15/2022  9:45 AM Elna Breslow Ples Specter, FNP [facility]   Follow up Visit:  Follow-up Information     [facility] Follow up in 1 week(s).   Specialty: Obstetrics and Gynecology Why: Blood pressure check  4-6 weeks pp visit as well Contact information: [address]  [place] [address] [phone]                 Please schedule this patient for a In person postpartum visit in 4 weeks with the following provider: Any provider. Additional Postpartum F/U: Birth control    High risk pregnancy complicated by:  Polyhydramnios  Delivery mode:  Vaginal, Spontaneous  Anticipated Birth Control:  depo vs OCP vs LARC    02/16/2022 Wilhemina Cash, MD  Attestation of Attending Supervision of Resident: Evaluation and management procedures were performed by the [facility] Medicine Resident under my supervision.  I have reviewed the Resident's note and chart, and I agree with the management and plan.  Wilhemina Cash, MD Resident PGY-1 [place] for [facility]

## 2022-02-15 LAB — PROTEIN / CREATININE RATIO, URINE
Creatinine, Urine: 40 mg/dL
Protein Creatinine Ratio: 0.93 mg/mg{Cre} — ABNORMAL HIGH (ref 0.00–0.15)
Total Protein, Urine: 37 mg/dL

## 2022-02-15 LAB — COMPREHENSIVE METABOLIC PANEL
ALT: 11 U/L (ref 0–44)
AST: 23 U/L (ref 15–41)
Albumin: 2.4 g/dL — ABNORMAL LOW (ref 3.5–5.0)
Alkaline Phosphatase: 93 U/L (ref 38–126)
Anion gap: 10 (ref 5–15)
BUN: <5 mg/dL — ABNORMAL LOW (ref 6–20)
CO2: 22 mmol/L (ref 22–32)
Calcium: 8.8 mg/dL — ABNORMAL LOW (ref 8.9–10.3)
Chloride: 106 mmol/L (ref 98–111)
Creatinine, Ser: 0.58 mg/dL (ref 0.44–1.00)
GFR, Estimated: >60 mL/min (ref >60)
Glucose, Bld: 82 mg/dL (ref 70–99)
Potassium: 3.5 mmol/L (ref 3.5–5.1)
Sodium: 138 mmol/L (ref 135–145)
Total Bilirubin: 0.6 mg/dL (ref 0.3–1.2)
Total Protein: 5.1 g/dL — ABNORMAL LOW (ref 6.5–8.1)

## 2022-02-15 LAB — CBC
HCT: 18.6 % — ABNORMAL LOW (ref 36.0–46.0)
Hemoglobin: 5.7 g/dL — CL (ref 12.0–15.0)
MCH: 25.2 pg — ABNORMAL LOW (ref 26.0–34.0)
MCHC: 30.6 g/dL (ref 30.0–36.0)
MCV: 82.3 fL (ref 80.0–100.0)
Platelets: 198 10*3/uL (ref 150–400)
RBC: 2.26 MIL/uL — ABNORMAL LOW (ref 3.87–5.11)
RDW: 23.3 % — ABNORMAL HIGH (ref 11.5–15.5)
WBC: 12.5 10*3/uL — ABNORMAL HIGH (ref 4.0–10.5)
nRBC: 0 % (ref 0.0–0.2)

## 2022-02-15 LAB — PREPARE RBC (CROSSMATCH)

## 2022-02-15 MED ORDER — ACETAMINOPHEN 325 MG PO TABS
650.0000 mg | ORAL_TABLET | Freq: Once | ORAL | Status: AC
  Administered 2022-02-15: 650 mg via ORAL
  Filled 2022-02-15: qty 2

## 2022-02-15 MED ORDER — DIPHENHYDRAMINE HCL 25 MG PO CAPS
25.0000 mg | ORAL_CAPSULE | Freq: Once | ORAL | Status: AC
  Administered 2022-02-15: 25 mg via ORAL
  Filled 2022-02-15: qty 1

## 2022-02-15 MED ORDER — SODIUM CHLORIDE 0.9% IV SOLUTION: Freq: Once | INTRAVENOUS | Status: AC

## 2022-02-15 NOTE — Progress Notes (Signed)
Critical Hgb of 5.7 called from lab. D.r Autry-Lott notified and new orders received. Royston Cowper, RN

## 2022-02-15 NOTE — Clinical Social Work Maternal (Signed)
CLINICAL SOCIAL WORK MATERNAL/CHILD NOTE  Patient Details  Name: Alyssa Sandoval MRN: [id number] Date of Birth: 20-May-2000  Date:  02/15/2022  Clinical Social Worker Initiating Note:  Letta Kocher, LCSWA Date/Time: Initiated:  02/15/22/1539     Child's Name:  Alyssa Sandoval   Biological Parents:  Mother, Father Alyssa Sandoval 06-[birth year]-1999, Alyssa Sandoval 07/17/1991)   Need for Interpreter:  None   Reason for Referral:  Newly Diagnosed HIV   Address:  [address] [place] [address]    Phone number:  [phone] (home)     Additional phone number:   Household Members/Support Persons (HM/SP):   Household Member/Support Person 1   HM/SP Name Relationship DOB or Age  HM/SP -1 Alyssa Sandoval boyfriend 07/17/1991  HM/SP -2        HM/SP -3        HM/SP -4        HM/SP -5        HM/SP -6        HM/SP -7        HM/SP -8          Natural Supports (not living in the home):      Professional Supports:     Employment: Unemployed   Type of Work:     Education:  Programmer, systems   Homebound arranged:    Museum/gallery curator Resources:  Medicaid   Other Resources:  Physicist, medical  , [place] Considerations Which May Impact Care:    Strengths:  Ability to meet basic needs  , Home prepared for child  , Pediatrician chosen   Psychotropic Medications:         Pediatrician:       Pediatrician List:   [place]      Pediatrician Fax Number:    Risk Factors/Current Problems:  None   Cognitive State:  Able to Concentrate  , Alert     Mood/Affect:  Calm  , Comfortable     CSW Assessment: CSW received consult for Specialty care to be arranged. CSW met with MOB to complete and assessment and offer support. CSW entered the room and observed MOB resting in bed, FOB on the couch and the infant in the bassinet. CSW introduced self and requested to speak  with MOB alone. FOB stepped outside the room. CSW inquired about how MOB was feeling, MOB reported good and her delivery went well.    CSW inquired about MOB HIV diagnosis. MOB was diagnosed in July of 2022. MOB is followed by [facility] for [facility]ious Disease. MOB reported she and baby have a follow up appointment December 7th.  CSW inquired about MOB MH hx, MOB reported she has been diagnosed with depression I the past. MOB reported a stable mood throughout her pregnancy. MOB reported she was taking antidepressants prior to pregnancy but stopped when she found out she was pregnant. CSW inquired if she plans on restarting her medication. MOB reported she wants to see how she feels without it first. CSW assessed for safety, MOB denied any SI, HI or DV. MOB identified FOB and her grandmother as her supports. CSW provided education regarding the baby blues period vs. perinatal mood disorders, discussed treatment and gave resources for mental health follow up if concerns arise.  CSW recommends self-evaluation during the postpartum time period using the New Mom Checklist  from Postpartum Progress and encouraged MOB to contact a medical professional if symptoms are noted at any time.    CSW provided review of Sudden Infant Death Syndrome (SIDS) precautions. MOB reported she has all necessary items for the infant including a bassinet for him to sleep. CSW identifies no further need for intervention and no barriers to discharge at this time.  CSW Plan/Description:  No Further Intervention Required/No Barriers to Discharge, Sudden Infant Death Syndrome (SIDS) Education, Perinatal Mood and Anxiety Disorder (PMADs) Education    Boris Sharper, LCSW 02/15/2022, 3:42 PM

## 2022-02-15 NOTE — Progress Notes (Signed)
Went to evaluate patient and discuss transfusion. Report that she does feel lightheaded when standing and numbness in right leg. Is otherwise doing well. Discussed risks and benefits of blood transfusion and patient is agreeable to transfusion. No further questions or concerns.

## 2022-02-15 NOTE — Progress Notes (Signed)
POSTPARTUM PROGRESS NOTE  Post Partum Day 1  Subjective:  Alyssa Sandoval is a 20 y.o. G2P1011 s/p SVD at [redacted]w[redacted]d.  She reports she is doing well. No acute events overnight. She denies any problems with ambulating, voiding or po intake. Denies nausea or vomiting.  Pain is moderately controlled, reports gluteal and lower back pain.  Lochia is moderate. Pins and needle sensation in left calf and foot.  Objective: Blood pressure 114/73, pulse 91, temperature 98.5 F (36.9 C), resp. rate 18, height 4\' 11"  (1.499 m), weight 79.3 kg, SpO2 100 %, pumping for feeding.  Physical Exam:  General: alert, cooperative and no distress Chest: no respiratory distress Heart:regular rate, distal pulses intact Abdomen: soft, nontender,  Uterine Fundus: firm, appropriately tender DVT Evaluation: Mild left calf swelling, no tenderness. Extremities: no edema Skin: warm, dry  Recent Labs    02/13/22 0024 02/14/22 1706  HGB 9.9* 7.2*  HCT 31.2* 23.3*    Assessment/Plan: Alyssa Sandoval is a 21 y.o. G2P1011 s/p SVD at [redacted]w[redacted]d   PPD#1 - Doing ok this morning   Repeat CBC  IV Venofer   Remove foley catheter Contraception:Mmirena came out during pph, considering OCPs vs depo shot  Feeding: pump Dispo: Plan for discharge 02/16/2022.   LOS: 3 days   Alyssa Sandoval Medical Student  I was personally present and re-performed the exam and MDM and verified the service and findings are accurately documented in the student's note. Seger Jani Autry-Lott, DO 02/15/22 8:35 AM

## 2022-02-15 NOTE — Progress Notes (Signed)
Blood pressure recheck remains elevated at 132/86. OB notified, no new orders.

## 2022-02-15 NOTE — Anesthesia Postprocedure Evaluation (Signed)
Anesthesia Post Note  Patient: Alyssa Sandoval  Procedure(s) Performed: AN AD HOC LABOR EPIDURAL     Patient location during evaluation: Mother Baby Anesthesia Type: Epidural Level of consciousness: awake and alert and oriented Pain management: satisfactory to patient Vital Signs Assessment: post-procedure vital signs reviewed and stable Respiratory status: respiratory function stable Cardiovascular status: stable Postop Assessment: no headache, no backache, epidural receding, patient able to bend at knees, no signs of nausea or vomiting, adequate PO intake and able to ambulate Anesthetic complications: no   No notable events documented.  Last Vitals:  Vitals:   02/15/22 0930 02/15/22 1011  BP: 123/81 124/88  Pulse: (!) 115 (!) 108  Resp: 17 16  Temp: 36.7 C 37.1 C  SpO2: 100% 99%    Last Pain:  Vitals:   02/15/22 1209  TempSrc:   PainSc: 4    Pain Goal:                   Toia Micale

## 2022-02-15 NOTE — Progress Notes (Signed)
Patient called out stating she had sudden bilateral lower back pain and IV pain. Blood administration paused and patient assessed. VS WNL, clear lungs, pain 5/10 with sudden onset. Patient is just now starting to move around the room and reports some shortness of breath with movement. RN explained that this can be normal due to hgb and volume replacement.   MD notified of new blood pressures with no new orders. Discussed her symptoms after start of 3rd transfusion and ordered it was okay to pause blood and attempt to restart if patient tolerated.   Will continue to monitor. Royston Cowper, RN

## 2022-02-16 ENCOUNTER — Other Ambulatory Visit (HOSPITAL_COMMUNITY): Payer: Self-pay

## 2022-02-16 LAB — TYPE AND SCREEN
ABO/RH(D): AB POS
Antibody Screen: NEGATIVE
Unit division: 0
Unit division: 0
Unit division: 0
Unit division: 0
Unit division: 0
Unit division: 0

## 2022-02-16 LAB — BPAM RBC
Blood Product Expiration Date: 202311092359
Blood Product Expiration Date: 202311222359
Blood Product Expiration Date: 202311232359
Blood Product Expiration Date: 202311232359
Blood Product Expiration Date: 202312062359
Blood Product Expiration Date: 202312082359
ISSUE DATE / TIME: 202311081729
ISSUE DATE / TIME: 202311090941
ISSUE DATE / TIME: 202311091248
ISSUE DATE / TIME: 202311091611
ISSUE DATE / TIME: 202311092123
ISSUE DATE / TIME: 202311092123
Unit Type and Rh: 6200
Unit Type and Rh: 6200
Unit Type and Rh: 6200
Unit Type and Rh: 7300
Unit Type and Rh: 8400
Unit Type and Rh: 8400

## 2022-02-16 LAB — CBC
HCT: 28.6 % — ABNORMAL LOW (ref 36.0–46.0)
Hemoglobin: 9.9 g/dL — ABNORMAL LOW (ref 12.0–15.0)
MCH: 28 pg (ref 26.0–34.0)
MCHC: 34.6 g/dL (ref 30.0–36.0)
MCV: 80.8 fL (ref 80.0–100.0)
Platelets: 190 10*3/uL (ref 150–400)
RBC: 3.54 MIL/uL — ABNORMAL LOW (ref 3.87–5.11)
RDW: 19.5 % — ABNORMAL HIGH (ref 11.5–15.5)
WBC: 13.1 10*3/uL — ABNORMAL HIGH (ref 4.0–10.5)
nRBC: 0.2 % (ref 0.0–0.2)

## 2022-02-16 MED ORDER — NIFEDIPINE ER OSMOTIC RELEASE 30 MG PO TB24
30.0000 mg | ORAL_TABLET | Freq: Every day | ORAL | Status: AC
  Administered 2022-02-16: 30 mg via ORAL
  Filled 2022-02-16: qty 1

## 2022-02-16 MED ORDER — POLYETHYLENE GLYCOL 3350 17 G PO PACK: 17.0000 g | PACK | Freq: Every day | ORAL | Status: DC | PRN

## 2022-02-16 MED ORDER — FERROUS SULFATE 325 (65 FE) MG PO TABS
325.0000 mg | ORAL_TABLET | ORAL | 3 refills | Status: DC
  Filled 2022-02-16: qty 15, 30d supply, fill #0

## 2022-02-16 MED ORDER — IBUPROFEN 600 MG PO TABS
600.0000 mg | ORAL_TABLET | Freq: Four times a day (QID) | ORAL | 0 refills | Status: DC | PRN
  Filled 2022-02-16: qty 30, 8d supply, fill #0

## 2022-02-16 MED ORDER — FUROSEMIDE 20 MG PO TABS
20.0000 mg | ORAL_TABLET | Freq: Every day | ORAL | 0 refills | Status: DC
  Filled 2022-02-16: qty 4, 4d supply, fill #0

## 2022-02-16 MED ORDER — FUROSEMIDE 20 MG PO TABS
20.0000 mg | ORAL_TABLET | Freq: Every day | ORAL | Status: DC
  Administered 2022-02-16: 20 mg via ORAL
  Filled 2022-02-16: qty 1

## 2022-02-16 MED ORDER — FERROUS SULFATE 325 (65 FE) MG PO TABS
325.0000 mg | ORAL_TABLET | ORAL | Status: DC
  Administered 2022-02-16: 325 mg via ORAL
  Filled 2022-02-16: qty 1

## 2022-02-16 MED ORDER — POLYETHYLENE GLYCOL 3350 17 GM/SCOOP PO POWD
17.0000 g | Freq: Every day | ORAL | 0 refills | Status: DC | PRN
  Filled 2022-02-16: qty 238, 14d supply, fill #0

## 2022-02-16 MED ORDER — NIFEDIPINE ER 30 MG PO TB24
30.0000 mg | ORAL_TABLET | Freq: Every day | ORAL | 11 refills | Status: DC
  Filled 2022-02-16: qty 30, 30d supply, fill #0

## 2022-02-22 ENCOUNTER — Encounter: Payer: Self-pay | Admitting: *Deleted

## 2022-02-22 ENCOUNTER — Telehealth (INDEPENDENT_AMBULATORY_CARE_PROVIDER_SITE_OTHER): Payer: Medicaid Other | Admitting: *Deleted

## 2022-02-22 VITALS — BP 116/78 | HR 93

## 2022-02-22 DIAGNOSIS — O165 Unspecified maternal hypertension, complicating the puerperium: Secondary | ICD-10-CM

## 2022-02-22 DIAGNOSIS — Z0130 Encounter for examination of blood pressure without abnormal findings: Secondary | ICD-10-CM

## 2022-02-22 NOTE — Progress Notes (Addendum)
   NURSE VISIT- BLOOD PRESSURE CHECK  I connected withNAME@ on 02/22/2022 by MyChart video and verified that I am speaking with the correct person using two identifiers.   I discussed the limitations of evaluation and management by telemedicine. The patient expressed understanding and agreed to proceed.  Nurse is at the office, and patient is at home.  SUBJECTIVE:  Alyssa Sandoval is a 21 y.o. G60P1011 female here for BP check. She is postpartum, delivery date 02/14/22     HYPERTENSION ROS:  Postpartum:  Severe headaches that don't go away with tylenol/other medicines: No  Visual changes (seeing spots/double/blurred vision) No  Severe pain under right breast breast or in center of upper chest No  Severe nausea/vomiting No  Taking medicines as instructed yes    OBJECTIVE:  BP 116/78 (BP Location: Left Arm, Patient Position: Sitting, Cuff Size: Normal)   Pulse 93   Breastfeeding No   Appearance alert, well appearing, and in no distress and oriented to person, place, and time.  ASSESSMENT: Postpartum  blood pressure check  PLAN: Discussed with Alyssa Sandoval, CNM   Recommendations: stop medicine 2 days before next visit   Follow-up: as scheduled   Jobe Marker  02/22/2022 2:08 PM  Chart reviewed for nurse visit. Agree with plan of care.  Jacklyn Shell, [place] 02/22/2022 3:56 PM

## 2022-02-23 ENCOUNTER — Telehealth (HOSPITAL_COMMUNITY): Payer: Self-pay | Admitting: *Deleted

## 2022-02-23 NOTE — Telephone Encounter (Signed)
Mom reports feeling good. No concerns about herself at this time. EPDS=0 [facility] score=0) Mom reports baby is doing well. Feeding, peeing, and pooping without difficulty. Safe sleep reviewed. Mom reports no concerns about baby at present.  Duffy Rhody, RN 02-23-2022 at 12:52pm

## 2022-03-14 ENCOUNTER — Ambulatory Visit: Payer: Medicaid Other | Admitting: Advanced Practice Midwife

## 2022-03-14 NOTE — Progress Notes (Deleted)
Brief Narrative   Patient ID: Alyssa Sandoval, female    DOB: 2001/03/12, 21 y.o.   MRN: [id number]  Ms. Parola is a 21 y/o AA female diagnosed with HIV disease with risk factor of heterosexual contact. Initial CD4 count count of 566 with viral load of 3,460. Genotype with no significant medication resistance mutations. No history of opportunistic infection. [id number] negative. Sole ART medication of Biktarvy. Entered care at [facility] Stage 1.    Subjective:    No chief complaint on file.   HPI:  Alyssa Sandoval is a 21 y.o. female last seen on 12/19/21 with well controlled virus and good adherence to [place]. Viral load was undetectable and CD4 count 877. She was in the second trimester of prengnancy and followed by OB. Delivered through spontaneous vaginal delivery on 02/14/22 of baby boy with course complicated by postpartum hypertension. Here today for follow up.    No Known Allergies    Outpatient Medications Prior to Visit  Medication Sig Dispense Refill   acetaminophen (TYLENOL) 650 MG CR tablet Take 650 mg by mouth every 8 (eight) hours as needed for pain. (Patient not taking: Reported on 02/22/2022)     acyclovir (ZOVIRAX) 400 MG tablet Take 1 tablet (400 mg total) by mouth 3 (three) times daily. 90 tablet 3   bictegravir-emtricitabine-tenofovir AF (BIKTARVY) 50-200-25 MG TABS tablet Take 1 tablet by mouth daily. 30 tablet 11   cyclobenzaprine (FLEXERIL) 5 MG tablet Take 1 tablet (5 mg total) by mouth at bedtime as needed for muscle spasms. 30 tablet 0   escitalopram (LEXAPRO) 5 MG tablet Take 1 tablet (5 mg total) by mouth daily. (Patient not taking: Reported on 02/22/2022) 30 tablet 4   ferrous sulfate 325 (65 FE) MG tablet Take 1 tablet (325 mg total) by mouth every other day. 15 tablet 3   furosemide (LASIX) 20 MG tablet Take 1 tablet (20 mg total) by mouth daily for 4 days. 4 tablet 0   ibuprofen (ADVIL) 600 MG tablet Take 1 tablet (600 mg total) by mouth every 6 (six) hours as needed  for moderate pain. 30 tablet 0   NIFEdipine (ADALAT CC) 30 MG 24 hr tablet Take 1 tablet (30 mg total) by mouth daily. 30 tablet 11   Phenyleph-CPM-DM-APAP (TYLENOL COLD MULTI-SYMPTOM PO) Take 2 tablets by mouth every 4 (four) hours as needed. (Patient not taking: Reported on 02/22/2022)     polyethylene glycol powder (GLYCOLAX/MIRALAX) 17 GM/SCOOP powder Take 17 g by mouth daily as needed for mild constipation, moderate constipation or severe constipation. (Patient not taking: Reported on 02/22/2022) 238 g 0   Prenatal Vit-Fe Fumarate-FA (PRENATAL MULTIVITAMIN) TABS tablet Take 1 tablet by mouth daily at 12 noon.     No facility-administered medications prior to visit.     Past Medical History:  Diagnosis Date   Chronic constipation 06/10/2012   COVID-19 04/2020   Depression    Dysfunctional uterine bleeding    GERD (gastroesophageal reflux disease) 06/10/2012   Headache 12/09/2020   High-risk pregnancy 07/13/2021   HIV (human immunodeficiency virus infection) ([place])    HIV (human immunodeficiency virus infection) ([place])    HSV (herpes simplex virus) anogenital infection    Low back pain 12/09/2020   Passive suicidal ideations 11/07/2020   Vaginal pruritus 03/15/2021   Vaginal yeast infection      Past Surgical History:  Procedure Laterality Date   NO PAST SURGERIES        Review of Systems  Constitutional:  Negative  for appetite change, chills, diaphoresis, fatigue, fever and unexpected weight change.  Eyes:        Negative for acute change in vision  Respiratory:  Negative for chest tightness, shortness of breath and wheezing.   Cardiovascular:  Negative for chest pain.  Gastrointestinal:  Negative for diarrhea, nausea and vomiting.  Genitourinary:  Negative for dysuria, pelvic pain and vaginal discharge.  Musculoskeletal:  Negative for neck pain and neck stiffness.  Skin:  Negative for rash.  Neurological:  Negative for seizures, syncope, weakness and headaches.   Hematological:  Negative for adenopathy. Does not bruise/bleed easily.  Psychiatric/Behavioral:  Negative for hallucinations.       Objective:    There were no vitals taken for this visit. Nursing note and vital signs reviewed.  Physical Exam Constitutional:      General: She is not in acute distress.    Appearance: She is well-developed.  Eyes:     Conjunctiva/sclera: Conjunctivae normal.  Cardiovascular:     Rate and Rhythm: Normal rate and regular rhythm.     Heart sounds: Normal heart sounds. No murmur heard.    No friction rub. No gallop.  Pulmonary:     Effort: Pulmonary effort is normal. No respiratory distress.     Breath sounds: Normal breath sounds. No wheezing or rales.  Chest:     Chest wall: No tenderness.  Abdominal:     General: Bowel sounds are normal.     Palpations: Abdomen is soft.     Tenderness: There is no abdominal tenderness.  Musculoskeletal:     Cervical back: Neck supple.  Lymphadenopathy:     Cervical: No cervical adenopathy.  Skin:    General: Skin is warm and dry.     Findings: No rash.  Neurological:     Mental Status: She is alert and oriented to person, place, and time.  Psychiatric:        Behavior: Behavior normal.        Thought Content: Thought content normal.        Judgment: Judgment normal.         01/24/2022    9:51 AM 12/19/2021    9:46 AM 09/18/2021    1:53 PM 08/09/2021    3:55 PM 05/02/2021    8:32 AM  Depression screen PHQ 2/9  Decreased Interest 3 0 0 1 0  Down, Depressed, Hopeless 0 0 0 0 0  PHQ - 2 Score 3 0 0 1 0  Altered sleeping 3   1   Tired, decreased energy 3   1   Change in appetite 0   1   Feeling bad or failure about yourself  0   0   Trouble concentrating 0   0   Moving slowly or fidgety/restless 0   0   Suicidal thoughts 0   0   PHQ-9 Score 9   4        Assessment & Plan:    Patient Active Problem List   Diagnosis Date Noted   Vaginal delivery 02/16/2022   Postpartum hemorrhage 02/16/2022    Polyhydramnios 02/13/2022   HIV positive (HCC) 01/29/2022   Pyelonephritis 08/19/2021   HSV (herpes simplex virus) anogenital infection 08/09/2021   Headache 12/09/2020   Low back pain 12/09/2020   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   Passive suicidal ideations 11/07/2020   HIV disease (HCC) 11/01/2020     Problem List Items Addressed This Visit   None    I am having Darral Dash  Kallstrom maintain her escitalopram, prenatal multivitamin, Biktarvy, cyclobenzaprine, acetaminophen, acyclovir, Phenyleph-CPM-DM-APAP (TYLENOL COLD MULTI-SYMPTOM PO), ibuprofen, furosemide, polyethylene glycol powder, ferrous sulfate, and NIFEdipine.   No orders of the defined types were placed in this encounter.    Follow-up: No follow-ups on file.   Terri Piedra, MSN, FNP-C Nurse Practitioner [facility] [place] number: [phone]

## 2022-03-15 ENCOUNTER — Ambulatory Visit: Payer: Medicaid Other | Admitting: Family

## 2022-04-09 NOTE — L&D Delivery Note (Signed)
OB/GYN Faculty Practice Delivery Note  Alyssa Sandoval is a 22 y.o. G3P1011 s/p SVD at [redacted]w[redacted]d. She was admitted for SOL.   ROM: rupture date, rupture time, delivery date, or delivery time have not been documented with clear fluid GBS Status:  Positive/-- (10/18 1511) Maximum Maternal Temperature: 99.61F  Labor Progress: Initial SVE: 5/90/-2. She then progressed to complete.   Delivery Date/Time: 10/3 @ 1819 Delivery: Called to room and patient was complete and pushing. Head delivered LOA. Nuchal cord present x2 and also loosely around RUE. Shoulder and body delivered in usual fashion. Nuchals reduced. Infant with spontaneous cry, placed on mother's abdomen, dried and stimulated. Cord clamped x 2 after 1-minute delay, and cut by FOB. Cord blood drawn. Placenta delivered spontaneously with gentle cord traction. Fundus firm with massage and Pitocin. Labia, perineum, vagina, and cervix inspected without laceration.  Noted to have lower uterine segment atony, removed clot form external os and given rectal cytotec 1g and TXA with improvement in bleeding.  Uterus firm to palpation.  Mom and baby doing well.    Baby Weight: pending  Placenta: 3 vessel, intact. Sent to L&D Complications: None Lacerations: None EBL: 520 mL Analgesia: Epidural   Infant:  APGAR (1 MIN): 9  APGAR (5 MINS): 9   Hessie Dibble, MD [facility] Fellow, [facility] for [facility], [facility] 01/10/2023, 6:48 PM

## 2022-06-05 ENCOUNTER — Ambulatory Visit (INDEPENDENT_AMBULATORY_CARE_PROVIDER_SITE_OTHER): Payer: Medicaid Other | Admitting: *Deleted

## 2022-06-05 DIAGNOSIS — Z3201 Encounter for pregnancy test, result positive: Secondary | ICD-10-CM

## 2022-06-05 LAB — POCT URINE PREGNANCY: Preg Test, Ur: POSITIVE — AB

## 2022-06-05 NOTE — Progress Notes (Signed)
   NURSE VISIT- PREGNANCY CONFIRMATION   SUBJECTIVE:  Marybeth Zumbrunnen is a 22 y.o. G53P1011 female at Unknown by uncertain LMP of No LMP recorded. Here for pregnancy confirmation.  Home pregnancy test: positive x 2   She reports no complaints.  She is taking prenatal vitamins.    OBJECTIVE:  Breastfeeding No   Appears well, in no apparent distress  No results found for this or any previous visit (from the past 24 hour(s)).  ASSESSMENT: Positive pregnancy test, Unknown by LMP    PLAN: Schedule for dating ultrasound in pending hcg level  Prenatal vitamins: continue   Nausea medicines: not currently needed   OB packet given: No  Janece Canterbury  06/05/2022 3:03 PM

## 2022-07-17 ENCOUNTER — Telehealth (INDEPENDENT_AMBULATORY_CARE_PROVIDER_SITE_OTHER): Payer: Medicaid Other

## 2022-07-17 DIAGNOSIS — O09899 Supervision of other high risk pregnancies, unspecified trimester: Secondary | ICD-10-CM | POA: Insufficient documentation

## 2022-07-17 DIAGNOSIS — O0990 Supervision of high risk pregnancy, unspecified, unspecified trimester: Secondary | ICD-10-CM | POA: Insufficient documentation

## 2022-07-17 DIAGNOSIS — F332 Major depressive disorder, recurrent severe without psychotic features: Secondary | ICD-10-CM

## 2022-07-17 DIAGNOSIS — Z3689 Encounter for other specified antenatal screening: Secondary | ICD-10-CM

## 2022-07-17 DIAGNOSIS — O3680X0 Pregnancy with inconclusive fetal viability, not applicable or unspecified: Secondary | ICD-10-CM

## 2022-07-17 NOTE — Progress Notes (Signed)
New OB Intake  I connected with Alyssa Sandoval  on 07/17/22 at  9:15 AM EDT by MyChart Video Visit and verified that I am speaking with the correct person using two identifiers. Nurse is located at [facility] and pt is located at home.  I discussed the limitations, risks, security and privacy concerns of performing an evaluation and management service by telephone and the availability of in person appointments. I also discussed with the patient that there may be a patient responsible charge related to this service. The patient expressed understanding and agreed to proceed.  I explained I am completing New OB Intake today. We discussed EDD of 01/23/23 that is based on LMP of 04/18/22. Pt is G3/P1. I reviewed her allergies, medications, Medical/Surgical/OB history, and appropriate screenings. I informed her of [facility] services. [facility] information placed in AVS. Based on history, this is a high risk pregnancy.  Patient Active Problem List   Diagnosis Date Noted   Supervision of high risk pregnancy, antepartum 07/17/2022   Vaginal delivery 02/16/2022   Postpartum hemorrhage 02/16/2022   Polyhydramnios 02/13/2022   HIV positive 01/29/2022   Pyelonephritis 08/19/2021   HSV (herpes simplex virus) anogenital infection 08/09/2021   Headache 12/09/2020   Low back pain 12/09/2020   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   Passive suicidal ideations 11/07/2020   HIV disease 11/01/2020    Concerns addressed today  Delivery Plans Plans to deliver at [facility]. Patient given information for [facility] Healthy Baby website for more information about [facility]. Patient is not interested in water birth. Offered upcoming OB visit with CNM to discuss further.  MyChart/Babyscripts MyChart access verified. I explained pt will have some visits in office and some virtually. Babyscripts instructions given and order placed. Patient verifies receipt of registration text/e-mail. Account successfully created and app  downloaded.  Blood Pressure Cuff/Weight Scale Pt has her own BP Cuff, Explained after first prenatal appt pt will check weekly and document in Babyscripts. Patient does have weight scale.  Anatomy US Explained first scheduled [place] will be around 19 weeks. Dating [place] scheduled for 07/20/22 at 0230p. Pt notified to arrive at 0215p.  Labs Discussed Alyssa Sandoval genetic screening with patient. Would like both Panorama and Horizon drawn at new OB visit. Routine prenatal labs needed.  COVID Vaccine Patient has had COVID vaccine.   Is patient a CenteringPregnancy candidate?  Not a Candidate  Not a candidate due to Complex coordination of care needed   Is patient a Mom+Baby Combined Care candidate?  Not a candidate     Social Determinants of Health Food Insecurity: Patient denies food insecurity. WIC Referral: Patient is interested in referral to [facility].  Transportation: Patient denies transportation needs. Childcare: Discussed no children allowed at ultrasound appointments. Offered childcare services; patient declines childcare services at this time.  Interested in [place]? If yes, send referral and doula dot phrase.   First visit review I reviewed new OB appt with patient. Explained pt will be seen by Thressa Sheller, CNM at first visit; encounter routed to appropriate provider. Explained that patient will be seen by pregnancy navigator following visit with provider.   Henrietta Dine, CMA 07/17/2022  9:58 AM

## 2022-07-17 NOTE — Progress Notes (Signed)
Pt not sure of LMP, so scheduling dating [place]. Please schedule Anatomy US once correct dating has been confirmed.

## 2022-07-17 NOTE — Assessment & Plan Note (Deleted)
Pt delivered on 02/14/22

## 2022-07-20 ENCOUNTER — Ambulatory Visit (HOSPITAL_COMMUNITY)
Admission: RE | Admit: 2022-07-20 | Discharge: 2022-07-20 | Disposition: A | Discharge status: Home / Self Care | Payer: Medicaid Other | Source: Ambulatory Visit | Attending: Advanced Practice Midwife | Admitting: Advanced Practice Midwife

## 2022-07-20 DIAGNOSIS — O3680X0 Pregnancy with inconclusive fetal viability, not applicable or unspecified: Secondary | ICD-10-CM | POA: Diagnosis present

## 2022-07-23 ENCOUNTER — Encounter: Payer: Self-pay | Admitting: Advanced Practice Midwife

## 2022-07-23 ENCOUNTER — Other Ambulatory Visit: Payer: Self-pay

## 2022-07-23 ENCOUNTER — Other Ambulatory Visit (HOSPITAL_COMMUNITY)
Admission: RE | Admit: 2022-07-23 | Discharge: 2022-07-23 | Disposition: A | Discharge status: Home / Self Care | Payer: Medicaid Other | Source: Ambulatory Visit | Attending: Advanced Practice Midwife | Admitting: Advanced Practice Midwife

## 2022-07-23 ENCOUNTER — Ambulatory Visit (INDEPENDENT_AMBULATORY_CARE_PROVIDER_SITE_OTHER): Payer: Medicaid Other | Admitting: Advanced Practice Midwife

## 2022-07-23 VITALS — BP 117/77 | HR 98 | Wt 172.9 lb

## 2022-07-23 DIAGNOSIS — Z3A38 38 weeks gestation of pregnancy: Secondary | ICD-10-CM | POA: Diagnosis not present

## 2022-07-23 DIAGNOSIS — O0990 Supervision of high risk pregnancy, unspecified, unspecified trimester: Secondary | ICD-10-CM | POA: Insufficient documentation

## 2022-07-23 DIAGNOSIS — Z21 Asymptomatic human immunodeficiency virus [HIV] infection status: Secondary | ICD-10-CM | POA: Diagnosis not present

## 2022-07-23 DIAGNOSIS — O0993 Supervision of high risk pregnancy, unspecified, third trimester: Secondary | ICD-10-CM | POA: Diagnosis not present

## 2022-07-23 DIAGNOSIS — Z3A19 19 weeks gestation of pregnancy: Secondary | ICD-10-CM | POA: Diagnosis not present

## 2022-07-23 DIAGNOSIS — Z3A13 13 weeks gestation of pregnancy: Secondary | ICD-10-CM | POA: Diagnosis not present

## 2022-07-23 DIAGNOSIS — N899 Noninflammatory disorder of vagina, unspecified: Secondary | ICD-10-CM

## 2022-07-23 DIAGNOSIS — O0991 Supervision of high risk pregnancy, unspecified, first trimester: Secondary | ICD-10-CM

## 2022-07-23 DIAGNOSIS — B009 Herpesviral infection, unspecified: Secondary | ICD-10-CM | POA: Diagnosis not present

## 2022-07-23 DIAGNOSIS — O98712 Human immunodeficiency virus [HIV] disease complicating pregnancy, second trimester: Secondary | ICD-10-CM | POA: Diagnosis not present

## 2022-07-23 DIAGNOSIS — O98812 Other maternal infectious and parasitic diseases complicating pregnancy, second trimester: Secondary | ICD-10-CM | POA: Diagnosis not present

## 2022-07-23 MED ORDER — BIKTARVY 50-200-25 MG PO TABS: 1.0000 | ORAL_TABLET | Freq: Every day | ORAL | 1 refills | Status: DC

## 2022-07-23 NOTE — Progress Notes (Signed)
Pt reports swelling on one side of vulva, with irritation.

## 2022-07-23 NOTE — Progress Notes (Signed)
  Subjective:    Alyssa Sandoval is being seen today for her first obstetrical visit.  This is not a planned pregnancy. She is at [redacted]w[redacted]d gestation. Her obstetrical history is significant for  HIV, HSV, short interval b/w pregnancies . Relationship with FOB: significant other, living together. Patient  undecided about  intent to breast feed. Pregnancy history fully reviewed.  Patient reports  some swelling on labia .  Review of Systems:   Review of Systems  All other systems reviewed and are negative.   Objective:     LMP 04/18/2022 Comment: a month ago.  + home pregnancy test on 3/24 Physical Exam Vitals and nursing note reviewed.  Constitutional:      General: She is not in acute distress. HENT:     Head: Normocephalic.  Eyes:     Pupils: Pupils are equal, round, and reactive to light.  Cardiovascular:     Rate and Rhythm: Normal rate.  Pulmonary:     Effort: Pulmonary effort is normal.  Abdominal:     Palpations: Abdomen is soft.     Tenderness: There is no abdominal tenderness.  Musculoskeletal:        General: Normal range of motion.  Skin:    General: Skin is warm and dry.  Neurological:     Mental Status: She is alert and oriented to person, place, and time.  Psychiatric:        Mood and Affect: Mood normal.        Behavior: Behavior normal.     Maternal Exam:  Abdomen: Fundal height is AGA.     Fetal Exam Fetal Monitor Review: Mode: hand-held doppler probe.   Baseline rate: 150.       Assessment:    Pregnancy: G3P1011 Patient Active Problem List   Diagnosis Date Noted   Supervision of high risk pregnancy, antepartum 07/17/2022   Short interval between pregnancies affecting pregnancy, antepartum 07/17/2022   Vaginal delivery 02/16/2022   Postpartum hemorrhage 02/16/2022   Polyhydramnios 02/13/2022   HIV positive 01/29/2022   Pyelonephritis 08/19/2021   HSV (herpes simplex virus) anogenital infection 08/09/2021   Headache 12/09/2020   Low back pain  12/09/2020   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   Passive suicidal ideations 11/07/2020   HIV disease 11/01/2020     Follows with ID for HIV, will get viral load today HSV- will need suppression at 35 weeks    Plan:     Initial labs drawn. Pap done in 2023  Has ID appt on 08/01/2022: refill of meds provided today so that she does not run out prior to appt. HIV viral load added to prenatal labs  Prenatal vitamins. Problem list reviewed and updated. AFP3 discussed: undecided. Role of ultrasound in pregnancy discussed; fetal survey: ordered. Amniocentesis discussed: not indicated. Follow up in 4 weeks. 50% of 45 min visit spent on counseling and coordination of care.      Thressa Sheller DNP, CNM  07/23/22  10:08 AM

## 2022-07-24 ENCOUNTER — Other Ambulatory Visit: Payer: Self-pay | Admitting: Advanced Practice Midwife

## 2022-07-24 DIAGNOSIS — O09899 Supervision of other high risk pregnancies, unspecified trimester: Secondary | ICD-10-CM

## 2022-07-24 DIAGNOSIS — F332 Major depressive disorder, recurrent severe without psychotic features: Secondary | ICD-10-CM

## 2022-07-24 DIAGNOSIS — O0990 Supervision of high risk pregnancy, unspecified, unspecified trimester: Secondary | ICD-10-CM

## 2022-07-24 DIAGNOSIS — O3680X0 Pregnancy with inconclusive fetal viability, not applicable or unspecified: Secondary | ICD-10-CM

## 2022-07-24 LAB — CERVICOVAGINAL ANCILLARY ONLY
Bacterial Vaginitis (gardnerella): POSITIVE — AB
Candida Glabrata: NEGATIVE
Candida Vaginitis: POSITIVE — AB
Chlamydia: NEGATIVE
Comment: NEGATIVE
Comment: NEGATIVE
Comment: NEGATIVE
Comment: NEGATIVE
Comment: NEGATIVE
Comment: NORMAL
Neisseria Gonorrhea: NEGATIVE
Trichomonas: NEGATIVE

## 2022-07-25 LAB — URINE CULTURE, OB REFLEX

## 2022-07-25 LAB — CULTURE, OB URINE

## 2022-07-28 MED ORDER — TERCONAZOLE 0.4 % VA CREA: 1.0000 | TOPICAL_CREAM | Freq: Every day | VAGINAL | 0 refills | Status: AC

## 2022-07-28 MED ORDER — METRONIDAZOLE 500 MG PO TABS: 500.0000 mg | ORAL_TABLET | Freq: Two times a day (BID) | ORAL | 0 refills | Status: DC

## 2022-07-28 NOTE — Addendum Note (Signed)
Addended by: Thressa Sheller D on: 07/28/2022 08:10 AM   Modules accepted: Orders

## 2022-07-30 ENCOUNTER — Encounter: Payer: Self-pay | Admitting: Advanced Practice Midwife

## 2022-07-30 ENCOUNTER — Telehealth: Payer: Self-pay | Admitting: Family Medicine

## 2022-07-30 LAB — CBC/D/PLT+RPR+RH+ABO+RUBIGG...
Antibody Screen: NEGATIVE
Basophils Absolute: 0 10*3/uL (ref 0.0–0.2)
Basos: 0 %
EOS (ABSOLUTE): 0 10*3/uL (ref 0.0–0.4)
Eos: 0 %
HCV Ab: NONREACTIVE
HIV Screen 4th Generation wRfx: REACTIVE
Hematocrit: 39 % (ref 34.0–46.6)
Hemoglobin: 13 g/dL (ref 11.1–15.9)
Hepatitis B Surface Ag: NEGATIVE
Immature Grans (Abs): 0 10*3/uL (ref 0.0–0.1)
Immature Granulocytes: 0 %
Lymphocytes Absolute: 2 10*3/uL (ref 0.7–3.1)
Lymphs: 24 %
MCH: 29.2 pg (ref 26.6–33.0)
MCHC: 33.3 g/dL (ref 31.5–35.7)
MCV: 88 fL (ref 79–97)
Monocytes Absolute: 0.5 10*3/uL (ref 0.1–0.9)
Monocytes: 6 %
Neutrophils Absolute: 5.9 10*3/uL (ref 1.4–7.0)
Neutrophils: 70 %
Platelets: 308 10*3/uL (ref 150–450)
RBC: 4.45 x10E6/uL (ref 3.77–5.28)
RDW: 13.7 % (ref 11.7–15.4)
RPR Ser Ql: NONREACTIVE
Rh Factor: POSITIVE
Rubella Antibodies, IGG: 2.06 index (ref >0.99)
WBC: 8.5 10*3/uL (ref 3.4–10.8)

## 2022-07-30 LAB — HIV 1/2 AB DIFFERENTIATION
HIV 1 Ab: REACTIVE
HIV 2 Ab: NONREACTIVE
NOTE (HIV CONF MULTIP: POSITIVE — AB

## 2022-07-30 LAB — HIV-1 RNA QUANT-NO REFLEX-BLD
HIV-1 RNA Viral Load Log: 3.832 log10copy/mL
HIV-1 RNA Viral Load: 6790 copies/mL

## 2022-07-30 LAB — HCV INTERPRETATION

## 2022-07-30 LAB — HEMOGLOBIN A1C
Est. average glucose Bld gHb Est-mCnc: 108 mg/dL
Hgb A1c MFr Bld: 5.4 % (ref 4.8–5.6)

## 2022-07-30 NOTE — Telephone Encounter (Signed)
Patient fell yesterday and she have some concerns

## 2022-08-01 ENCOUNTER — Ambulatory Visit: Payer: Medicaid Other | Admitting: Family

## 2022-08-01 LAB — PANORAMA PRENATAL TEST FULL PANEL:PANORAMA TEST PLUS 5 ADDITIONAL MICRODELETIONS: FETAL FRACTION: 11

## 2022-08-20 ENCOUNTER — Other Ambulatory Visit: Payer: Self-pay

## 2022-08-20 ENCOUNTER — Ambulatory Visit (INDEPENDENT_AMBULATORY_CARE_PROVIDER_SITE_OTHER): Payer: Medicaid Other

## 2022-08-20 VITALS — BP 110/71 | HR 98 | Wt 176.8 lb

## 2022-08-20 DIAGNOSIS — Z21 Asymptomatic human immunodeficiency virus [HIV] infection status: Secondary | ICD-10-CM

## 2022-08-20 DIAGNOSIS — A609 Anogenital herpesviral infection, unspecified: Secondary | ICD-10-CM

## 2022-08-20 DIAGNOSIS — Z3A17 17 weeks gestation of pregnancy: Secondary | ICD-10-CM

## 2022-08-20 DIAGNOSIS — O0990 Supervision of high risk pregnancy, unspecified, unspecified trimester: Secondary | ICD-10-CM

## 2022-08-20 NOTE — Progress Notes (Signed)
   PRENATAL VISIT NOTE  Subjective:  Alyssa Sandoval is a 22 y.o. G3P1011 at [redacted]w[redacted]d being seen today for ongoing prenatal care.  She is currently monitored for the following issues for this low-risk pregnancy and has HIV disease (HCC); Major depression; Passive suicidal ideations; Headache; Low back pain; HSV (herpes simplex virus) anogenital infection; Pyelonephritis; HIV positive (HCC); Polyhydramnios; Vaginal delivery; Postpartum hemorrhage; Supervision of high risk pregnancy, antepartum; and Short interval between pregnancies affecting pregnancy, antepartum on their problem list.  Patient reports abdomen "feels heavy". Occasional cramping.  Contractions: Not present. Vag. Bleeding: None.  Movement: Absent. Denies leaking of fluid.   The following portions of the patient's history were reviewed and updated as appropriate: allergies, current medications, past family history, past medical history, past social history, past surgical history and problem list.   Objective:   Vitals:   08/20/22 1519  BP: 110/71  Pulse: 98  Weight: 176 lb 12.8 oz (80.2 kg)    Fetal Status: Fetal Heart Rate (bpm): 148   Movement: Absent     General:  Alert, oriented and cooperative. Patient is in no acute distress.  Skin: Skin is warm and dry. No rash noted.   Cardiovascular: Normal heart rate noted  Respiratory: Normal respiratory effort, no problems with respiration noted  Abdomen: Soft, gravid, appropriate for gestational age.  Pain/Pressure: Present     Pelvic: Cervical exam deferred        Extremities: Normal range of motion.  Edema: None  Mental Status: Normal mood and affect. Normal behavior. Normal judgment and thought content.   Assessment and Plan:  Pregnancy: G3P1011 at [redacted]w[redacted]d 1. Supervision of high risk pregnancy, antepartum - Routine OB. Doing well. - Recommend support belt to help with abdominal "heaviness" - Anatomy US scheduled on 5/22 - Desires Nexplanon for contraception  2. [redacted] weeks  gestation of pregnancy   3. HIV positive (HCC) - On Biktarvy - Follows ID, next appointment on 5/22  4. HSV (herpes simplex virus) anogenital infection - Suppression at 36 weeks  Preterm labor symptoms and general obstetric precautions including but not limited to vaginal bleeding, contractions, leaking of fluid and fetal movement were reviewed in detail with the patient. Please refer to After Visit Summary for other counseling recommendations.   Return in about 4 weeks (around 09/17/2022) for LOB.  Future Appointments  Date Time Provider Department Center  08/29/2022  9:30 AM [facility] NURSE [facility]  08/29/2022  9:45 AM [facility]  08/29/2022  1:45 PM Veryl Speak, FNP [facility]  09/11/2022  1:10 PM Tommie Sams, DO [facility]  09/20/2022  3:15 PM Dorathy Kinsman, CNM [facility]    Brand Males, CNM

## 2022-08-20 NOTE — Progress Notes (Signed)
Pt is concerned that the baby is not moving yet.

## 2022-08-22 LAB — AFP, SERUM, OPEN SPINA BIFIDA
AFP MoM: 0.92
AFP Value: 35.2 ng/mL
Gest. Age on Collection Date: 17 weeks
Maternal Age At EDD: 23.2 yr
OSBR Risk 1 IN: 10000
Test Results:: NEGATIVE
Weight: 176 [lb_av]

## 2022-08-26 ENCOUNTER — Inpatient Hospital Stay (HOSPITAL_COMMUNITY)
Admission: AD | Admit: 2022-08-26 | Discharge: 2022-08-27 | Disposition: A | Discharge status: Home / Self Care | Payer: Medicaid Other | Attending: Obstetrics and Gynecology | Admitting: Obstetrics and Gynecology

## 2022-08-26 ENCOUNTER — Encounter (HOSPITAL_COMMUNITY): Payer: Self-pay | Admitting: Obstetrics and Gynecology

## 2022-08-26 DIAGNOSIS — T783XXA Angioneurotic edema, initial encounter: Secondary | ICD-10-CM | POA: Diagnosis present

## 2022-08-26 DIAGNOSIS — X58XXXA Exposure to other specified factors, initial encounter: Secondary | ICD-10-CM | POA: Insufficient documentation

## 2022-08-26 DIAGNOSIS — L509 Urticaria, unspecified: Secondary | ICD-10-CM | POA: Diagnosis not present

## 2022-08-26 DIAGNOSIS — Z3A18 18 weeks gestation of pregnancy: Secondary | ICD-10-CM | POA: Diagnosis not present

## 2022-08-26 DIAGNOSIS — O99712 Diseases of the skin and subcutaneous tissue complicating pregnancy, second trimester: Secondary | ICD-10-CM | POA: Insufficient documentation

## 2022-08-26 MED ORDER — TRIAMCINOLONE ACETONIDE 0.1 % EX CREA
TOPICAL_CREAM | Freq: Once | CUTANEOUS | Status: AC
  Filled 2022-08-26: qty 15

## 2022-08-26 MED ORDER — METHYLPREDNISOLONE 4 MG PO TBPK: ORAL_TABLET | ORAL | 0 refills | Status: DC

## 2022-08-26 MED ORDER — METHYLPREDNISOLONE SODIUM SUCC 125 MG IJ SOLR
125.0000 mg | Freq: Once | INTRAMUSCULAR | Status: AC
  Administered 2022-08-26: 125 mg via INTRAMUSCULAR
  Filled 2022-08-26: qty 2

## 2022-08-26 NOTE — MAU Provider Note (Signed)
Chief Complaint:  Allergic Reaction   Event Date/Time   First Provider Initiated Contact with Patient 08/26/22 2146     HPI: Alyssa Sandoval is a 22 y.o. G3P1011 at 33w4dwho presents to maternity admissions reporting onset of facial itching and burning since 5pm.  Has had this before but not this severe.  Usually just involves eyes and cheeks but now is all over her face.  Tried Benadryl and Cortisone cream without relief.  Cold cloth did not help.  No known allergens.  States has had these episodes since childhood.  Has never had a workup for it. . She reports good fetal movement, denies diffuculty breathing, throat swelling or fever/chills.    Other This is a new problem. The current episode started today. The problem occurs constantly. The problem has been gradually worsening.     RN Note: Alyssa Sandoval is a 22 y.o. at [redacted]w[redacted]d here in MAU reporting: burning and itching on face that began around 5 pm. Reports she took benadryl and Cortisone 10 cream around 7 pm.  Reports she didn't do anything to provoke it, has not applied anything or eaten anything different.  Reports she is prone to hived on her face, but cortisone cream relieves it.   Past Medical History: Past Medical History:  Diagnosis Date   Chronic constipation 06/10/2012   COVID-19 04/2020   Depression    Dysfunctional uterine bleeding    GERD (gastroesophageal reflux disease) 06/10/2012   Headache 12/09/2020   High-risk pregnancy 07/13/2021   HIV (human immunodeficiency virus infection) (HCC)    HIV (human immunodeficiency virus infection) (HCC)    HSV (herpes simplex virus) anogenital infection    Low back pain 12/09/2020   Passive suicidal ideations 11/07/2020   Vaginal pruritus 03/15/2021   Vaginal yeast infection     Past obstetric history: OB History  Gravida Para Term Preterm AB Living  3 1 1  0 1 1  SAB IAB Ectopic Multiple Live Births  1 0 0 0 1    # Outcome Date GA Lbr Len/2nd Weight Sex Delivery Anes PTL Lv   3 Current           2 Term 02/14/22 [redacted]w[redacted]d 04:49 / 03:39 3220 g M Vag-Spont EPI  LIV  1 SAB 2020            Past Surgical History: Past Surgical History:  Procedure Laterality Date   NO PAST SURGERIES      Family History: Family History  Problem Relation Age of Onset   Obesity Mother    Fibromyalgia Mother    Mental illness Mother    Hypertension Maternal Grandmother    Colon cancer Maternal Grandfather     Social History: Social History   Tobacco Use   Smoking status: Former    Types: Software engineer, E-cigarettes   Smokeless tobacco: Never   Tobacco comments:    vapes  Vaping Use   Vaping Use: Never used  Substance Use Topics   Alcohol use: No   Drug use: Not Currently    Types: Marijuana    Comment: more than a year ago    Allergies: No Known Allergies  Meds:  No medications prior to admission.    I have reviewed patient's Past Medical Hx, Surgical Hx, Family Hx, Social Hx, medications and allergies.   ROS:  Review of Systems Other systems negative  Physical Exam  Patient Vitals for the past 24 hrs:  BP Temp Temp src Pulse Resp SpO2 Height Weight  08/26/22 2354 Marland Kitchen)  108/54 -- -- (!) 105 -- -- -- --  08/26/22 2142 120/80 98.6 F (37 C) Oral (!) 105 17 100 % 5' (1.524 m) 81 kg   Constitutional: Well-developed, well-nourished female in no acute distress.  Face:  Erythema with some swelling of facial skin Cardiovascular: normal rate and rhythm Respiratory: normal effort, clear to auscultation bilaterally  No wheezing GI: Abd soft, non-tender, gravid appropriate for gestational age.   No rebound or guarding. MS: Extremities nontender, no edema, normal ROM Neurologic: Alert and oriented x 4.  GU: Neg CVAT. FHT:  153   Labs: No results found for this or any previous visit (from the past 24 hour(s)). AB/Positive/-- (04/15 1056)  Imaging:  No results found.  MAU Course/MDM: I have reviewed the triage vital signs and the nursing notes.   Pertinent labs &  imaging results that were available during my care of the patient were reviewed by me and considered in my medical decision making (see chart for details).      I have reviewed her medical records including past results, notes and treatments.   Treatments in MAU included Solumedrol IM given   Triamcinolone cream applied.    Assessment: 1. Hives   2. Angioedema, initial encounter   3. [redacted] weeks gestation of pregnancy     Plan: Discharge home Rx Medrol Dosepak for angioedema Return if worsening occurs Follow up in Office for prenatal visits and recheck  Follow-up Information     [facility] Follow up.   Specialty: Obstetrics and Gynecology Contact information: [address] [place] [address] [phone]                Pt stable at time of discharge.  Wynelle Bourgeois CNM, MSN Certified Nurse-Midwife 08/27/2022 1:11 AM

## 2022-08-26 NOTE — MAU Note (Addendum)
..  Alyssa Sandoval is a 22 y.o. at 102w4d here in MAU reporting: burning and itching on face that began around 5 pm. Reports she took benadryl and Cortisone 10 cream around 7 pm.  Reports she didn't do anything to provoke it, has not applied anything or eaten anything different.  Reports she is prone to hived on her face, but cortisone cream relieves it.   Pain score: 8/10 burning Vitals:   08/26/22 2142  BP: 120/80  Pulse: (!) 105  Resp: 17  Temp: 98.6 F (37 C)  SpO2: 100%     FHT:153

## 2022-08-27 DIAGNOSIS — L509 Urticaria, unspecified: Secondary | ICD-10-CM | POA: Diagnosis not present

## 2022-08-27 DIAGNOSIS — Z3A18 18 weeks gestation of pregnancy: Secondary | ICD-10-CM

## 2022-08-28 ENCOUNTER — Encounter: Payer: Self-pay | Admitting: *Deleted

## 2022-08-29 ENCOUNTER — Ambulatory Visit: Payer: Medicaid Other | Admitting: Family

## 2022-08-29 ENCOUNTER — Ambulatory Visit (INDEPENDENT_AMBULATORY_CARE_PROVIDER_SITE_OTHER): Payer: Medicaid Other | Admitting: Family

## 2022-08-29 ENCOUNTER — Ambulatory Visit: Payer: Medicaid Other | Attending: Advanced Practice Midwife

## 2022-08-29 ENCOUNTER — Other Ambulatory Visit: Payer: Self-pay

## 2022-08-29 ENCOUNTER — Ambulatory Visit: Payer: Medicaid Other | Admitting: *Deleted

## 2022-08-29 ENCOUNTER — Encounter: Payer: Self-pay | Admitting: *Deleted

## 2022-08-29 ENCOUNTER — Other Ambulatory Visit: Payer: Self-pay | Admitting: *Deleted

## 2022-08-29 ENCOUNTER — Telehealth: Payer: Self-pay

## 2022-08-29 VITALS — BP 100/70 | HR 94 | Temp 97.9°F | Ht 60.0 in | Wt 179.0 lb

## 2022-08-29 VITALS — BP 114/60 | HR 94

## 2022-08-29 DIAGNOSIS — O0990 Supervision of high risk pregnancy, unspecified, unspecified trimester: Secondary | ICD-10-CM | POA: Insufficient documentation

## 2022-08-29 DIAGNOSIS — Z21 Asymptomatic human immunodeficiency virus [HIV] infection status: Secondary | ICD-10-CM

## 2022-08-29 DIAGNOSIS — O98812 Other maternal infectious and parasitic diseases complicating pregnancy, second trimester: Secondary | ICD-10-CM | POA: Diagnosis not present

## 2022-08-29 DIAGNOSIS — B009 Herpesviral infection, unspecified: Secondary | ICD-10-CM

## 2022-08-29 DIAGNOSIS — O98712 Human immunodeficiency virus [HIV] disease complicating pregnancy, second trimester: Secondary | ICD-10-CM

## 2022-08-29 DIAGNOSIS — O09899 Supervision of other high risk pregnancies, unspecified trimester: Secondary | ICD-10-CM

## 2022-08-29 DIAGNOSIS — E669 Obesity, unspecified: Secondary | ICD-10-CM

## 2022-08-29 DIAGNOSIS — Z3A19 19 weeks gestation of pregnancy: Secondary | ICD-10-CM | POA: Diagnosis not present

## 2022-08-29 DIAGNOSIS — O99212 Obesity complicating pregnancy, second trimester: Secondary | ICD-10-CM

## 2022-08-29 DIAGNOSIS — Z362 Encounter for other antenatal screening follow-up: Secondary | ICD-10-CM

## 2022-08-29 DIAGNOSIS — L509 Urticaria, unspecified: Secondary | ICD-10-CM

## 2022-08-29 MED ORDER — BIKTARVY 50-200-25 MG PO TABS: 1.0000 | ORAL_TABLET | Freq: Every day | ORAL | 5 refills | Status: DC

## 2022-08-29 NOTE — Telephone Encounter (Signed)
Referral made to allergy.  Will see Dorathy Kinsman, CNM for [facility] visit on 09/20/22. Judeth Cornfield, RNC

## 2022-08-29 NOTE — Progress Notes (Unsigned)
Brief Narrative   Patient ID: Alyssa Sandoval, female    DOB: 07/18/2000, 22 y.o.   MRN: [id number]  Alyssa Sandoval is a 22 y/o AA female diagnosed with HIV disease with risk factor of heterosexual contact. Initial CD4 count count of 566 with viral load of 3,460. Genotype with no significant medication resistance mutations. No history of opportunistic infection. [id number] negative. Sole ART medication of Biktarvy. Entered care at [facility] Stage 1.    Subjective:    Chief Complaint  Patient presents with   Follow-up    HPI:  Alyssa Sandoval is a 22 y.o. female with HIV disease last seen on 12/19/21 with well controlled virus and good adherence and tolerance to Alyssa Sandoval. Viral load was undetectable and CD4 count 877. Delivered a babt boy in November. Here today for follow up.  Alyssa Sandoval is currently [redacted] weeks pregnant and was recently seen in Alyssa Sandoval OB office and found to have a viral load of 6790. Alyssa Sandoval has not been taking Alyssa Sandoval Biktarvy. No adverse side effects or problems getting medication. Forgets to take medication regularly. Feeling okay with no new concerns/complaints. Declines vaccinations.   Denies fevers, chills, night sweats, headaches, changes in vision, neck pain/stiffness, nausea, diarrhea, vomiting, lesions or rashes.   No Known Allergies    Outpatient Medications Prior to Visit  Medication Sig Dispense Refill   diphenhydrAMINE (BENADRYL) 25 mg capsule Take 25 mg by mouth every 6 (six) hours as needed for allergies.     Prenatal MV & Min w/FA-DHA (PRENATAL GUMMIES PO) Take by mouth.     bictegravir-emtricitabine-tenofovir AF (BIKTARVY) 50-200-25 MG TABS tablet Take 1 tablet by mouth daily. 30 tablet 1   furosemide (LASIX) 20 MG tablet Take 1 tablet (20 mg total) by mouth daily for 4 days. 4 tablet 0   methylPREDNISolone (MEDROL DOSEPAK) 4 MG TBPK tablet Take as directed, taper down each day (Patient not taking: Reported on 08/29/2022) 1 each 0   No facility-administered medications prior to  visit.     Past Medical History:  Diagnosis Date   Chronic constipation 06/10/2012   COVID-19 04/2020   Depression    Dysfunctional uterine bleeding    GERD (gastroesophageal reflux disease) 06/10/2012   Headache 12/09/2020   Headache 12/09/2020   High-risk pregnancy 07/13/2021   HIV (human immunodeficiency virus infection) (HCC)    HIV (human immunodeficiency virus infection) (HCC)    HIV disease (HCC) 11/01/2020   -on Biktarvy, last viral load: 07/13/21: 12,000  12/19/21: not detected   EFW @ 28, 36wks     No testing        C/S @ 38wks (VL >1K) or IOL @ 39wks (VL <1K)   HSV (herpes simplex virus) anogenital infection    Low back pain 12/09/2020   Low back pain 12/09/2020   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   Passive suicidal ideations 11/07/2020   Passive suicidal ideations 11/07/2020   Polyhydramnios 02/13/2022   Postpartum hemorrhage 02/16/2022   Pyelonephritis 08/19/2021   Admission 5/13-5/15   Urine cx poc 09/11/21 neg   Vaginal delivery 02/16/2022   Vaginal pruritus 03/15/2021   Vaginal yeast infection      Past Surgical History:  Procedure Laterality Date   NO PAST SURGERIES        Review of Systems  Constitutional:  Negative for appetite change, chills, diaphoresis, fatigue, fever and unexpected weight change.  Eyes:        Negative for acute change in vision  Respiratory:  Negative for chest tightness, shortness  of breath and wheezing.   Cardiovascular:  Negative for chest pain.  Gastrointestinal:  Negative for diarrhea, nausea and vomiting.  Genitourinary:  Negative for dysuria, pelvic pain and vaginal discharge.  Musculoskeletal:  Negative for neck pain and neck stiffness.  Skin:  Negative for rash.  Neurological:  Negative for seizures, syncope, weakness and headaches.  Hematological:  Negative for adenopathy. Does not bruise/bleed easily.  Psychiatric/Behavioral:  Negative for hallucinations.       Objective:    BP 100/70   Pulse 94   Temp 97.9 F  (36.6 C) (Oral)   Ht 5' (1.524 m)   Wt 179 lb (81.2 kg)   LMP 04/18/2022 Comment: a month ago.  + home pregnancy test on 3/24  SpO2 95%   BMI 34.96 kg/m  Nursing note and vital signs reviewed.  Physical Exam Constitutional:      General: Alyssa Sandoval is not in acute distress.    Appearance: Alyssa Sandoval is well-developed.  Eyes:     Conjunctiva/sclera: Conjunctivae normal.  Cardiovascular:     Rate and Rhythm: Normal rate and regular rhythm.     Heart sounds: Normal heart sounds. No murmur heard.    No friction rub. No gallop.  Pulmonary:     Effort: Pulmonary effort is normal. No respiratory distress.     Breath sounds: Normal breath sounds. No wheezing or rales.  Chest:     Chest wall: No tenderness.  Abdominal:     General: Bowel sounds are normal.     Palpations: Abdomen is soft.     Tenderness: There is no abdominal tenderness.  Musculoskeletal:     Cervical back: Neck supple.  Lymphadenopathy:     Cervical: No cervical adenopathy.  Skin:    General: Skin is warm and dry.     Findings: No rash.  Neurological:     Mental Status: Alyssa Sandoval is alert and oriented to person, place, and time.  Psychiatric:        Behavior: Behavior normal.        Thought Content: Thought content normal.        Judgment: Judgment normal.         08/29/2022    3:16 PM 07/23/2022   10:06 AM 01/24/2022    9:51 AM 12/19/2021    9:46 AM 09/18/2021    1:53 PM  Depression screen PHQ 2/9  Decreased Interest 0 0 3 0 0  Down, Depressed, Hopeless 0 0 0 0 0  PHQ - 2 Score 0 0 3 0 0  Altered sleeping  3 3    Tired, decreased energy  3 3    Change in appetite  0 0    Feeling bad or failure about yourself   0 0    Trouble concentrating  0 0    Moving slowly or fidgety/restless  0 0    Suicidal thoughts  0 0    PHQ-9 Score  6 9         Assessment & Plan:    Patient Active Problem List   Diagnosis Date Noted   Supervision of high risk pregnancy, antepartum 07/17/2022   Short interval between pregnancies  affecting pregnancy, antepartum 07/17/2022   HIV positive (HCC) 01/29/2022   HSV (herpes simplex virus) anogenital infection 08/09/2021     Problem List Items Addressed This Visit       Other   HIV positive (HCC) - Primary    Alyssa Sandoval has poorly controlled virus secondary to less than optimal adherence  to Alyssa Sandoval ART regimen of Biktarvy. Reviewed recent lab work and discussed importance of taking medication daily to reduce risk of disease progression or complications in the future. I am not sure that Alyssa Sandoval truly understands the importance of this. Alyssa Sandoval is now [redacted] weeks pregnant once again. Alyssa Sandoval wishes to stay on Biktarvy as Alyssa Sandoval did before after discussion of possible risks/benefits. Check lab work. Continue current dose of Biktarvy. Plan for follow up in 1 month or sooner if needed with lab work on the same day.       Relevant Medications   bictegravir-emtricitabine-tenofovir AF (BIKTARVY) 50-200-25 MG TABS tablet   Other Relevant Orders   COMPLETE METABOLIC PANEL WITH GFR (Completed)   T-helper cells (CD4) count (not at [facility])   HIV RNA, RTPCR W/R GT (RTI, PI,INT)   Supervision of high risk pregnancy, antepartum    Alyssa Sandoval is [redacted] weeks pregnant and has poorly controlled virus. Reviewed HIV and pregnancy. Alyssa Sandoval was undetectable last time and hopefully will be able to get back there again. Continue routine care per OB.         I am having Alyssa Sandoval maintain Alyssa Sandoval furosemide, Prenatal MV & Min w/FA-DHA (PRENATAL GUMMIES PO), diphenhydrAMINE, methylPREDNISolone, and Biktarvy.   Meds ordered this encounter  Medications   bictegravir-emtricitabine-tenofovir AF (BIKTARVY) 50-200-25 MG TABS tablet    Sig: Take 1 tablet by mouth daily.    Dispense:  30 tablet    Refill:  5    Order Specific Question:   Supervising Provider    Answer:   Judyann Munson [[id number]]     Follow-up: Return in about 1 month (around 09/29/2022).   Marcos Eke, MSN, FNP-C Nurse Practitioner [facility] Main number: [phone]

## 2022-08-29 NOTE — Patient Instructions (Signed)
Nice to see you.  We will check your lab work today.  Continue to take your medication daily as prescribed.  Refills have been sent to the pharmacy.  Goal of 25 in a month.   Plan for follow up in 1 months or sooner if needed with lab work on the same day.  Have a great day and stay safe!  

## 2022-08-29 NOTE — Telephone Encounter (Signed)
-----   Message from [name], CNM sent at 08/27/2022 12:09 AM EDT ----- Regarding: Needs referral to Allergy MD Seen for facial redness/edema/Itching/burning States has had it intermittently since childhood  Never had a workup   I treated with steroids  Can you get her an appt?  I put an order in her discharge orders  Alyssa Sandoval

## 2022-08-30 ENCOUNTER — Encounter: Payer: Self-pay | Admitting: Family

## 2022-08-30 LAB — T-HELPER CELLS (CD4) COUNT (NOT AT ARMC)
Absolute CD4: 1173 cells/uL (ref 490–1740)
Total lymphocyte count: 2814 cells/uL (ref 850–3900)

## 2022-08-30 NOTE — Assessment & Plan Note (Signed)
Alyssa Sandoval has poorly controlled virus secondary to less than optimal adherence to her ART regimen of Biktarvy. Reviewed recent lab work and discussed importance of taking medication daily to reduce risk of disease progression or complications in the future. I am not sure that she truly understands the importance of this. She is now [redacted] weeks pregnant once again. She wishes to stay on Biktarvy as she did before after discussion of possible risks/benefits. Check lab work. Continue current dose of Biktarvy. Plan for follow up in 1 month or sooner if needed with lab work on the same day.

## 2022-08-30 NOTE — Assessment & Plan Note (Signed)
Alyssa Sandoval is [redacted] weeks pregnant and has poorly controlled virus. Reviewed HIV and pregnancy. She was undetectable last time and hopefully will be able to get back there again. Continue routine care per OB.

## 2022-09-01 LAB — COMPLETE METABOLIC PANEL WITH GFR
AG Ratio: 1.4 (calc) (ref 1.0–2.5)
ALT: 18 U/L (ref 6–29)
AST: 13 U/L (ref 10–30)
Albumin: 3.8 g/dL (ref 3.6–5.1)
Alkaline phosphatase (APISO): 70 U/L (ref 31–125)
BUN/Creatinine Ratio: 23 (calc) — ABNORMAL HIGH (ref 6–22)
BUN: 10 mg/dL (ref 7–25)
CO2: 22 mmol/L (ref 20–32)
Calcium: 8.9 mg/dL (ref 8.6–10.2)
Chloride: 105 mmol/L (ref 98–110)
Creat: 0.43 mg/dL — ABNORMAL LOW (ref 0.50–0.96)
Globulin: 2.7 g/dL (calc) (ref 1.9–3.7)
Glucose, Bld: 100 mg/dL — ABNORMAL HIGH (ref 65–99)
Potassium: 3.7 mmol/L (ref 3.5–5.3)
Sodium: 136 mmol/L (ref 135–146)
Total Bilirubin: 0.2 mg/dL (ref 0.2–1.2)
Total Protein: 6.5 g/dL (ref 6.1–8.1)
eGFR: 141 mL/min/{1.73_m2} (ref >=60)

## 2022-09-01 LAB — HIV RNA, RTPCR W/R GT (RTI, PI,INT)
HIV 1 RNA Quant: 164 copies/mL — ABNORMAL HIGH
HIV-1 RNA Quant, Log: 2.21 Log copies/mL — ABNORMAL HIGH

## 2022-09-01 LAB — T-HELPER CELLS (CD4) COUNT (NOT AT ARMC): CD4 T Helper %: 42 % (ref 30–61)

## 2022-09-04 ENCOUNTER — Ambulatory Visit: Payer: Self-pay

## 2022-09-04 ENCOUNTER — Encounter: Payer: Self-pay | Admitting: Advanced Practice Midwife

## 2022-09-04 NOTE — Telephone Encounter (Signed)
   Chief Complaint: Yellow-clear vaginal discharge. Symptoms: Above Frequency: Today Pertinent Negatives: Patient denies burning Disposition: [] ED /[] Urgent Care (no appt availability in office) / [] Appointment(In office/virtual)/ []  [facility]/ [] Home Care/ [] Refused Recommended Disposition /[] [facility]/ [x]  Follow-up with PCP Additional Notes: Will call OB/GYN. Go to ED for abdominal pain.  Reason for Disposition  [1] Yellow or green vaginal discharge AND [2] fever  Answer Assessment - Initial Assessment Questions 1. DISCHARGE: "Describe the discharge." (e.g., white, yellow, green, gray, foamy, cottage cheese-like)     Yellow clear 2. ODOR: "Is there a bad odor?"     No 3. ONSET: "When did the discharge begin?"     Today 4. RASH: "Is there a rash in that area?" If Yes, ask: "Describe it." (e.g., redness, blisters, sores, bumps)     No 5. ABDOMEN PAIN: "Are you having any abdomen pain?" If Yes, ask: "What does it feel like?" (e.g., crampy, dull, intermittent, constant)      Cramps earlier 6. ABDOMEN PAIN SEVERITY: If present, ask: "How bad is it?"  (e.g., Scale 1-10; mild, moderate, or severe)   - MILD (1-3): doesn't interfere with normal activities, abdomen soft and not tender to touch    - MODERATE (4-7): interferes with normal activities or awakens from sleep, abdomen tender to touch    - SEVERE (8-10): excruciating pain, doubled over, unable to do any normal activities     None 7. CAUSE: "What do you think is causing the discharge?"     Unsure 8. OTHER SYMPTOMS: "Do you have any other symptoms?" (e.g., fever, itching, vaginal bleeding, pain with urination)     No 9. EDD: "What date are you expecting to deliver?"      01/23/23 10. PREGNANCY: "How many weeks pregnant are you?"       Yes  Protocols used: Pregnancy - Vaginal Discharge-A-AH

## 2022-09-05 ENCOUNTER — Inpatient Hospital Stay (HOSPITAL_COMMUNITY)
Admission: AD | Admit: 2022-09-05 | Discharge: 2022-09-05 | Disposition: A | Discharge status: Home / Self Care | Payer: Medicaid Other | Attending: Obstetrics and Gynecology | Admitting: Obstetrics and Gynecology

## 2022-09-05 ENCOUNTER — Encounter (HOSPITAL_COMMUNITY): Payer: Self-pay | Admitting: Obstetrics and Gynecology

## 2022-09-05 DIAGNOSIS — M545 Low back pain, unspecified: Secondary | ICD-10-CM | POA: Insufficient documentation

## 2022-09-05 DIAGNOSIS — R102 Pelvic and perineal pain: Secondary | ICD-10-CM | POA: Insufficient documentation

## 2022-09-05 DIAGNOSIS — M549 Dorsalgia, unspecified: Secondary | ICD-10-CM | POA: Diagnosis not present

## 2022-09-05 DIAGNOSIS — O98512 Other viral diseases complicating pregnancy, second trimester: Secondary | ICD-10-CM | POA: Insufficient documentation

## 2022-09-05 DIAGNOSIS — Z3A20 20 weeks gestation of pregnancy: Secondary | ICD-10-CM | POA: Diagnosis not present

## 2022-09-05 DIAGNOSIS — Z8616 Personal history of COVID-19: Secondary | ICD-10-CM | POA: Diagnosis not present

## 2022-09-05 DIAGNOSIS — N898 Other specified noninflammatory disorders of vagina: Secondary | ICD-10-CM | POA: Insufficient documentation

## 2022-09-05 DIAGNOSIS — Z21 Asymptomatic human immunodeficiency virus [HIV] infection status: Secondary | ICD-10-CM | POA: Diagnosis not present

## 2022-09-05 DIAGNOSIS — M25559 Pain in unspecified hip: Secondary | ICD-10-CM

## 2022-09-05 DIAGNOSIS — O26892 Other specified pregnancy related conditions, second trimester: Secondary | ICD-10-CM | POA: Diagnosis not present

## 2022-09-05 DIAGNOSIS — Z87891 Personal history of nicotine dependence: Secondary | ICD-10-CM | POA: Insufficient documentation

## 2022-09-05 DIAGNOSIS — O99891 Other specified diseases and conditions complicating pregnancy: Secondary | ICD-10-CM | POA: Diagnosis not present

## 2022-09-05 HISTORY — DX: Gestational (pregnancy-induced) hypertension without significant proteinuria, unspecified trimester: O13.9

## 2022-09-05 LAB — URINALYSIS, ROUTINE W REFLEX MICROSCOPIC
Bilirubin Urine: NEGATIVE
Glucose, UA: NEGATIVE mg/dL
Hgb urine dipstick: NEGATIVE
Ketones, ur: NEGATIVE mg/dL
Nitrite: NEGATIVE
Protein, ur: 30 mg/dL — AB
Specific Gravity, Urine: 1.021 (ref 1.005–1.030)
WBC, UA: >50 WBC/hpf (ref 0–5)
pH: 7 (ref 5.0–8.0)

## 2022-09-05 LAB — WET PREP, GENITAL
Clue Cells Wet Prep HPF POC: NONE SEEN
Sperm: NONE SEEN
Trich, Wet Prep: NONE SEEN
WBC, Wet Prep HPF POC: >=10 — AB (ref <10)
Yeast Wet Prep HPF POC: NONE SEEN

## 2022-09-05 MED ORDER — CYCLOBENZAPRINE HCL 10 MG PO TABS: 10.0000 mg | ORAL_TABLET | Freq: Two times a day (BID) | ORAL | 0 refills | Status: DC | PRN

## 2022-09-05 MED ORDER — CYCLOBENZAPRINE HCL 5 MG PO TABS
10.0000 mg | ORAL_TABLET | Freq: Once | ORAL | Status: AC
  Administered 2022-09-05: 10 mg via ORAL
  Filled 2022-09-05: qty 2

## 2022-09-05 NOTE — MAU Provider Note (Signed)
History     CSN: [id number]  Arrival date and time: 09/05/22 1128   Event Date/Time   First Provider Initiated Contact with Patient 09/05/22 1208      Chief Complaint  Patient presents with   Abdominal Pain   Back Pain   Pelvic Pain   Vaginal Discharge   Alyssa Sandoval is a 22 y.o. year old G32P1011 female at [redacted]w[redacted]d weeks gestation who presents to MAU reporting back mucous that came out in clumps yesterday, hip pain, lower back pain, lower abdominal pain; rated 8/10. She "feels like a thousand pounds pushing down." She reports the mucous still coming out today. She describes it as yellow and sees it when she only sees it when she pees. She denies VB or LOF. She denies recent SI. Her high-risk pregnancy is complicated by (+) HIV, (+) HSV, and closely spaced pregnancies. She receives [facility] with [facility]; next appt is 09/20/2022. Her FOB is present and contributing to the history taking.   OB History     Gravida  3   Para  1   Term  1   Preterm  0   AB  1   Living  1      SAB  1   IAB  0   Ectopic  0   Multiple  0   Live Births  1           Past Medical History:  Diagnosis Date   Chronic constipation 06/10/2012   COVID-19 04/2020   Depression    Dysfunctional uterine bleeding    GERD (gastroesophageal reflux disease) 06/10/2012   Headache 12/09/2020   Headache 12/09/2020   High-risk pregnancy 07/13/2021   HIV (human immunodeficiency virus infection) (HCC)    HIV (human immunodeficiency virus infection) (HCC)    HIV disease (HCC) 11/01/2020   -on Biktarvy, last viral load: 07/13/21: 12,000  12/19/21: not detected   EFW @ 28, 36wks     No testing        C/S @ 38wks (VL >1K) or IOL @ 39wks (VL <1K)   HSV (herpes simplex virus) anogenital infection    Low back pain 12/09/2020   Low back pain 12/09/2020   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   Passive suicidal ideations 11/07/2020   Passive suicidal ideations 11/07/2020   Polyhydramnios 02/13/2022   Postpartum hemorrhage  02/16/2022   Pregnancy induced hypertension    Pyelonephritis 08/19/2021   Admission 5/13-5/15   Urine cx poc 09/11/21 neg   Vaginal delivery 02/16/2022   Vaginal pruritus 03/15/2021   Vaginal yeast infection     Past Surgical History:  Procedure Laterality Date   NO PAST SURGERIES      Family History  Problem Relation Age of Onset   Obesity Mother    Fibromyalgia Mother    Mental illness Mother    Other Father        not to her knowledge, re med problems   Hypertension Maternal Grandmother    Colon cancer Maternal Grandfather     Social History   Tobacco Use   Smoking status: Former    Types: Cigars, E-cigarettes   Smokeless tobacco: Never   Tobacco comments:    vapes  Vaping Use   Vaping Use: Never used  Substance Use Topics   Alcohol use: No   Drug use: Not Currently    Types: Marijuana    Comment: more than a year ago    Allergies: No Known Allergies  Medications Prior to Admission  Medication Sig Dispense Refill Last Dose   Prenatal MV & Min w/FA-DHA (PRENATAL GUMMIES PO) Take by mouth.   09/04/2022   bictegravir-emtricitabine-tenofovir AF (BIKTARVY) 50-200-25 MG TABS tablet Take 1 tablet by mouth daily. 30 tablet 5 09/03/2022   diphenhydrAMINE (BENADRYL) 25 mg capsule Take 25 mg by mouth every 6 (six) hours as needed for allergies.      furosemide (LASIX) 20 MG tablet Take 1 tablet (20 mg total) by mouth daily for 4 days. 4 tablet 0     Review of Systems  Constitutional: Negative.   HENT: Negative.    Eyes: Negative.   Respiratory: Negative.    Cardiovascular: Negative.   Gastrointestinal: Negative.   Endocrine: Negative.   Genitourinary:  Positive for pelvic pain (pain and pressure) and vaginal discharge.  Musculoskeletal:  Positive for back pain.       Hip pain  Skin: Negative.   Allergic/Immunologic: Negative.   Neurological: Negative.   Hematological: Negative.   Psychiatric/Behavioral: Negative.     Physical Exam   Blood pressure 124/78,  pulse 82, temperature 98.2 F (36.8 C), temperature source Oral, resp. rate 18, height 5' (1.524 m), weight 81.4 kg, last menstrual period 04/18/2022, SpO2 98 %, not currently breastfeeding.  Physical Exam Vitals and nursing note reviewed. Exam conducted with a chaperone present.  Constitutional:      Appearance: Normal appearance. She is obese.  Cardiovascular:     Rate and Rhythm: Normal rate.  Pulmonary:     Effort: Pulmonary effort is normal.  Abdominal:     Palpations: Abdomen is soft.  Genitourinary:    General: Normal vulva.     Comments: Pelvic exam: External genitalia normal, SE: vaginal walls pink and well rugated, cervix is smooth, pink, no lesions, moderate amt of purulent, yellow vaginal d/c -- WP, GC/CT done, cervix visually closed, Uterus is non-tender, S=D, no CMT or friability, no adnexal tenderness.  Musculoskeletal:        General: Normal range of motion.  Skin:    General: Skin is warm and dry.  Neurological:     Mental Status: She is alert and oriented to person, place, and time.  Psychiatric:        Mood and Affect: Mood normal.        Behavior: Behavior normal.        Thought Content: Thought content normal.        Judgment: Judgment normal.    FHTs by doppler: 150 bpm  MAU Course  Procedures  MDM CCUA UCx -- Results pending Wet Prep GC/CT -- Results pending  Flexeril 10 mg po -- pain relieved  Results for orders placed or performed during the hospital encounter of 09/05/22 (from the past 24 hour(s))  Urinalysis, Routine w reflex microscopic -Urine, Clean Catch     Status: Abnormal   Collection Time: 09/05/22 12:00 PM  Result Value Ref Range   Color, Urine YELLOW YELLOW   APPearance HAZY (A) CLEAR   Specific Gravity, Urine 1.021 1.005 - 1.030   pH 7.0 5.0 - 8.0   Glucose, UA NEGATIVE NEGATIVE mg/dL   Hgb urine dipstick NEGATIVE NEGATIVE   Bilirubin Urine NEGATIVE NEGATIVE   Ketones, ur NEGATIVE NEGATIVE mg/dL   Protein, ur 30 (A) NEGATIVE  mg/dL   Nitrite NEGATIVE NEGATIVE   Leukocytes,Ua LARGE (A) NEGATIVE   RBC / HPF 0-5 0 - 5 RBC/hpf   WBC, UA >50 0 - 5 WBC/hpf   Bacteria, UA FEW (A) NONE SEEN   Squamous  Epithelial / HPF 6-10 0 - 5 /HPF   Mucus PRESENT   Wet prep, genital     Status: Abnormal   Collection Time: 09/05/22 12:24 PM   Specimen: PATH Cytology Cervicovaginal Ancillary Only  Result Value Ref Range   Yeast Wet Prep HPF POC NONE SEEN NONE SEEN   Trich, Wet Prep NONE SEEN NONE SEEN   Clue Cells Wet Prep HPF POC NONE SEEN NONE SEEN   WBC, Wet Prep HPF POC >=10 (A) <10   Sperm NONE SEEN      Assessment and Plan  1. Back pain affecting pregnancy in second trimester - Information provided on back pain in pregnancy - Rx: Flexeril 10 mg po BID prn pain   2. Pregnancy related hip pain in second trimester, antepartum  3. [redacted] weeks gestation of pregnancy   - Discharge patient - Keep scheduled appt with [facility] on 09/20/2022 - Patient verbalized an understanding of the plan of care and agrees.  Raelyn Mora, CNM 09/05/2022, 12:08 PM

## 2022-09-05 NOTE — MAU Note (Signed)
Alyssa Sandoval is a 22 y.o. at [redacted]w[redacted]d here in MAU reporting: yesterday morning, started having mucous coming out, cont today.  Been having pain in her hips, lower back and lower abd.   "Feels like there is a thousand pounds pushing down, lots of pressure'.  This morning it was still mucous but yellow, sees it when she pees. No bleeding, no LOF.  Denies recent intercourse  Onset of complaint: yesterday morning Pain score: 8, was a 10 yesterday Vitals:   09/05/22 1150  BP: 124/78  Pulse: 82  Resp: 18  Temp: 98.2 F (36.8 C)  SpO2: 98%     FHT:150 Lab orders placed from triage:  urine

## 2022-09-06 LAB — GC/CHLAMYDIA PROBE AMP (~~LOC~~) NOT AT ARMC
Chlamydia: NEGATIVE
Comment: NEGATIVE
Comment: NORMAL
Neisseria Gonorrhea: NEGATIVE

## 2022-09-07 ENCOUNTER — Encounter: Payer: Self-pay | Admitting: Obstetrics and Gynecology

## 2022-09-10 ENCOUNTER — Other Ambulatory Visit: Payer: Medicaid Other

## 2022-09-10 DIAGNOSIS — O0990 Supervision of high risk pregnancy, unspecified, unspecified trimester: Secondary | ICD-10-CM

## 2022-09-11 ENCOUNTER — Ambulatory Visit: Payer: Medicaid Other | Admitting: Family Medicine

## 2022-09-11 LAB — POCT URINALYSIS DIP (DEVICE)
Bilirubin Urine: NEGATIVE
Glucose, UA: NEGATIVE mg/dL
Ketones, ur: NEGATIVE mg/dL
Nitrite: NEGATIVE
Protein, ur: NEGATIVE mg/dL
Specific Gravity, Urine: >=1.03 (ref 1.005–1.030)
Urobilinogen, UA: 0.2 mg/dL (ref 0.0–1.0)
pH: 6.5 (ref 5.0–8.0)

## 2022-09-13 LAB — CULTURE, OB URINE

## 2022-09-13 LAB — URINE CULTURE, OB REFLEX

## 2022-09-14 ENCOUNTER — Other Ambulatory Visit: Payer: Self-pay

## 2022-09-14 MED ORDER — NITROFURANTOIN MONOHYD MACRO 100 MG PO CAPS: 100.0000 mg | ORAL_CAPSULE | Freq: Two times a day (BID) | ORAL | 0 refills | Status: DC

## 2022-09-20 ENCOUNTER — Ambulatory Visit (INDEPENDENT_AMBULATORY_CARE_PROVIDER_SITE_OTHER): Payer: Medicaid Other | Admitting: Advanced Practice Midwife

## 2022-09-20 VITALS — BP 103/61 | HR 119 | Wt 181.6 lb

## 2022-09-20 DIAGNOSIS — Z21 Asymptomatic human immunodeficiency virus [HIV] infection status: Secondary | ICD-10-CM

## 2022-09-20 DIAGNOSIS — A609 Anogenital herpesviral infection, unspecified: Secondary | ICD-10-CM

## 2022-09-20 DIAGNOSIS — O09892 Supervision of other high risk pregnancies, second trimester: Secondary | ICD-10-CM

## 2022-09-20 DIAGNOSIS — O0992 Supervision of high risk pregnancy, unspecified, second trimester: Secondary | ICD-10-CM

## 2022-09-20 DIAGNOSIS — M79606 Pain in leg, unspecified: Secondary | ICD-10-CM

## 2022-09-20 DIAGNOSIS — O26892 Other specified pregnancy related conditions, second trimester: Secondary | ICD-10-CM

## 2022-09-20 DIAGNOSIS — M25559 Pain in unspecified hip: Secondary | ICD-10-CM

## 2022-09-20 DIAGNOSIS — O09899 Supervision of other high risk pregnancies, unspecified trimester: Secondary | ICD-10-CM

## 2022-09-20 DIAGNOSIS — Z3A22 22 weeks gestation of pregnancy: Secondary | ICD-10-CM

## 2022-09-20 DIAGNOSIS — O0990 Supervision of high risk pregnancy, unspecified, unspecified trimester: Secondary | ICD-10-CM

## 2022-09-20 NOTE — Patient Instructions (Addendum)
[email or web address]     TDaP Vaccine Pregnancy Get the Whooping Cough Vaccine While You Are Pregnant (CDC)  It is important for women to get the whooping cough vaccine in the third trimester of each pregnancy. Vaccines are the best way to prevent this disease. There are 2 different whooping cough vaccines. Both vaccines combine protection against whooping cough, tetanus and diphtheria, but they are for different age groups: Tdap: for everyone 11 years or older, including pregnant women  DTaP: for children 2 months through 6 years of age  You need the whooping cough vaccine during each of your pregnancies The recommended time to get the shot is during your 27th through 36th week of pregnancy, preferably during the earlier part of this time period. The Centers for Disease Control and Prevention (CDC) recommends that pregnant women receive the whooping cough vaccine for adolescents and adults (called Tdap vaccine) during the third trimester of each pregnancy. The recommended time to get the shot is during your 27th through 36th week of pregnancy, preferably during the earlier part of this time period. This replaces the original recommendation that pregnant women get the vaccine only if they had not previously received it. The American College of Obstetricians and Gynecologists and the American College of Nurse-Midwives support this recommendation.  You should get the whooping cough vaccine while pregnant to pass protection to your baby frame support disabled and/or not supported in this browser  Learn why Alyssa Sandoval decided to get the whooping cough vaccine in her 3rd trimester of pregnancy and how her baby girl was born with some protection against the disease. Also available on YouTube. After receiving the whooping cough vaccine, your body will create protective antibodies (proteins produced by the body to fight off diseases) and pass some of them to your baby before birth. These antibodies  provide your baby some short-term protection against whooping cough in early life. These antibodies can also protect your baby from some of the more serious complications that come along with whooping cough. Your protective antibodies are at their highest about 2 weeks after getting the vaccine, but it takes time to pass them to your baby. So the preferred time to get the whooping cough vaccine is early in your third trimester. The amount of whooping cough antibodies in your body decreases over time. That is why CDC recommends you get a whooping cough vaccine during each pregnancy. Doing so allows each of your babies to get the greatest number of protective antibodies from you. This means each of your babies will get the best protection possible against this disease.  Getting the whooping cough vaccine while pregnant is better than getting the vaccine after you give birth Whooping cough vaccination during pregnancy is ideal so your baby will have short-term protection as soon as he is born. This early protection is important because your baby will not start getting his whooping cough vaccines until he is 2 months old. These first few months of life are when your baby is at greatest risk for catching whooping cough. This is also when he's at greatest risk for having severe, potentially life-threating complications from the infection. To avoid that gap in protection, it is best to get a whooping cough vaccine during pregnancy. You will then pass protection to your baby before he is born. To continue protecting your baby, he should get whooping cough vaccines starting at 2 months old. You may never have gotten the Tdap vaccine before and did not get it during this pregnancy. If   so, you should make sure to get the vaccine immediately after you give birth, before leaving the hospital or birthing center. It will take about 2 weeks before your body develops protection (antibodies) in response to the vaccine. Once you  have protection from the vaccine, you are less likely to give whooping cough to your newborn while caring for him. But remember, your baby will still be at risk for catching whooping cough from others. A recent study looked to see how effective Tdap was at preventing whooping cough in babies whose mothers got the vaccine while pregnant or in the hospital after giving birth. The study found that getting Tdap between 27 through 36 weeks of pregnancy is 85% more effective at preventing whooping cough in babies younger than 2 months old. Blood tests cannot tell if you need a whooping cough vaccine There are no blood tests that can tell you if you have enough antibodies in your body to protect yourself or your baby against whooping cough. Even if you have been sick with whooping cough in the past or previously received the vaccine, you still should get the vaccine during each pregnancy. Breastfeeding may pass some protective antibodies onto your baby By breastfeeding, you may pass some antibodies you have made in response to the vaccine to your baby. When you get a whooping cough vaccine during your pregnancy, you will have antibodies in your breast milk that you can share with your baby as soon as your milk comes in. However, your baby will not get protective antibodies immediately if you wait to get the whooping cough vaccine until after delivering your baby. This is because it takes about 2 weeks for your body to create antibodies. Learn more about the health benefits of breastfeeding.  

## 2022-09-23 NOTE — Progress Notes (Signed)
   PRENATAL VISIT NOTE  Subjective:  Alyssa Sandoval is a 22 y.o. G3P1011 at [redacted]w[redacted]d being seen today for ongoing prenatal care.  She is currently monitored for the following issues for this high-risk pregnancy and has HSV (herpes simplex virus) anogenital infection; HIV positive (HCC); Supervision of high risk pregnancy, antepartum; and Short interval between pregnancies affecting pregnancy, antepartum on their problem list.  Patient reports  bilat hip and ankle pain . Makes it so that she has a very hard time walking at times. No improvement w/ maternity support belt. Denies abd or back pain. Contractions: Not present. Vag. Bleeding: None.  Movement: Present. Denies leaking of fluid.   The following portions of the patient's history were reviewed and updated as appropriate: allergies, current medications, past family history, past medical history, past social history, past surgical history and problem list.   Objective:   Vitals:   09/20/22 1546  BP: 103/61  Pulse: (!) 119  Weight: 181 lb 9.6 oz (82.4 kg)    Fetal Status: Fetal Heart Rate (bpm): 150   Movement: Present    Fundal ht 22  General:  Alert, oriented and cooperative. Patient is in no acute distress.  Skin: Skin is warm and dry. No rash noted.   Cardiovascular: Normal heart rate noted  Respiratory: Normal respiratory effort, no problems with respiration noted  Abdomen: Soft, gravid, appropriate for gestational age.  Pain/Pressure: Present     Pelvic: Cervical exam deferred        Extremities: Normal range of motion.  Edema: Trace  Mental Status: Normal mood and affect. Normal behavior. Normal judgment and thought content.   Assessment and Plan:  Pregnancy: G3P1011 at [redacted]w[redacted]d 1. HIV positive (HCC) - Followed by ID "Poorly controlled virus secondary to less than optimal adherence to her ART regimen of Biktarvy. Reviewed recent lab work and discussed importance of taking medication daily to reduce risk of disease progression or  complications in the future. I am not sure that she truly understands the importance of this. "  2. HSV (herpes simplex virus) anogenital infection - Plan Valtrex at ~36 weeks   3. Supervision of high risk pregnancy, antepartum - Routine care  4. Short interval between pregnancies affecting pregnancy, antepartum - PTL precautions  5. [redacted] weeks gestation of pregnancy   6. Pregnancy related hip pain in second trimester, antepartum - COmfrto measures - Tylenol, occasional IBU OK, but not after 28 weeks - Ambulatory referral to Physical Therapy  7. Pregnancy related leg pain in second trimester, antepartum  - Ambulatory referral to Physical Therapy  Preterm labor symptoms and general obstetric precautions including but not limited to vaginal bleeding, contractions, leaking of fluid and fetal movement were reviewed in detail with the patient. Please refer to After Visit Summary for other counseling recommendations.   Return in about 4 weeks (around 10/18/2022) for ROB/GTT.  Future Appointments  Date Time Provider Department Center  09/27/2022 12:30 PM [employer] NURSE [facility]  09/27/2022 12:45 PM [facility] US5 [facility]  10/17/2022  9:30 AM [facility] LAB [facility]  10/17/2022 11:15 AM Osborne Oman [facility]  10/17/2022  2:45 PM Veryl Speak, FNP [facility]  10/31/2022  2:30 PM Alfonse Spruce, MD [facility] None    Dorathy Kinsman, CNM

## 2022-09-27 ENCOUNTER — Ambulatory Visit: Payer: Medicaid Other | Admitting: *Deleted

## 2022-09-27 ENCOUNTER — Ambulatory Visit: Payer: Medicaid Other | Attending: Obstetrics

## 2022-09-27 ENCOUNTER — Other Ambulatory Visit: Payer: Self-pay | Admitting: *Deleted

## 2022-09-27 VITALS — BP 107/62 | HR 96

## 2022-09-27 DIAGNOSIS — O98812 Other maternal infectious and parasitic diseases complicating pregnancy, second trimester: Secondary | ICD-10-CM | POA: Diagnosis not present

## 2022-09-27 DIAGNOSIS — O0990 Supervision of high risk pregnancy, unspecified, unspecified trimester: Secondary | ICD-10-CM | POA: Insufficient documentation

## 2022-09-27 DIAGNOSIS — Z362 Encounter for other antenatal screening follow-up: Secondary | ICD-10-CM | POA: Diagnosis not present

## 2022-09-27 DIAGNOSIS — B20 Human immunodeficiency virus [HIV] disease: Secondary | ICD-10-CM | POA: Diagnosis not present

## 2022-09-27 DIAGNOSIS — O09899 Supervision of other high risk pregnancies, unspecified trimester: Secondary | ICD-10-CM | POA: Diagnosis present

## 2022-09-27 DIAGNOSIS — Z3A23 23 weeks gestation of pregnancy: Secondary | ICD-10-CM

## 2022-09-27 DIAGNOSIS — O99212 Obesity complicating pregnancy, second trimester: Secondary | ICD-10-CM

## 2022-09-27 DIAGNOSIS — B009 Herpesviral infection, unspecified: Secondary | ICD-10-CM

## 2022-09-27 DIAGNOSIS — E669 Obesity, unspecified: Secondary | ICD-10-CM

## 2022-09-27 DIAGNOSIS — O98712 Human immunodeficiency virus [HIV] disease complicating pregnancy, second trimester: Secondary | ICD-10-CM

## 2022-10-10 ENCOUNTER — Emergency Department (HOSPITAL_COMMUNITY)
Admission: EM | Admit: 2022-10-10 | Discharge: 2022-10-11 | Disposition: A | Discharge status: Home / Self Care | Payer: MEDICAID | Attending: Emergency Medicine | Admitting: Emergency Medicine

## 2022-10-10 ENCOUNTER — Emergency Department (HOSPITAL_COMMUNITY): Payer: MEDICAID

## 2022-10-10 ENCOUNTER — Other Ambulatory Visit: Payer: Self-pay

## 2022-10-10 DIAGNOSIS — M7989 Other specified soft tissue disorders: Secondary | ICD-10-CM | POA: Insufficient documentation

## 2022-10-10 DIAGNOSIS — O0990 Supervision of high risk pregnancy, unspecified, unspecified trimester: Secondary | ICD-10-CM

## 2022-10-10 DIAGNOSIS — O9A212 Injury, poisoning and certain other consequences of external causes complicating pregnancy, second trimester: Secondary | ICD-10-CM | POA: Insufficient documentation

## 2022-10-10 DIAGNOSIS — S99921A Unspecified injury of right foot, initial encounter: Secondary | ICD-10-CM | POA: Insufficient documentation

## 2022-10-10 DIAGNOSIS — Z3A25 25 weeks gestation of pregnancy: Secondary | ICD-10-CM | POA: Diagnosis not present

## 2022-10-10 DIAGNOSIS — W228XXA Striking against or struck by other objects, initial encounter: Secondary | ICD-10-CM | POA: Diagnosis not present

## 2022-10-10 DIAGNOSIS — O09892 Supervision of other high risk pregnancies, second trimester: Secondary | ICD-10-CM | POA: Diagnosis not present

## 2022-10-10 NOTE — ED Triage Notes (Signed)
Pt c/o pain in her right great toe, states she hit it on a wooden bed frame yesterday and the pain has gotten worse. Pt ambulatory in triage.

## 2022-10-11 MED ORDER — ACETAMINOPHEN 325 MG PO TABS
650.0000 mg | ORAL_TABLET | Freq: Once | ORAL | Status: AC
  Administered 2022-10-11: 650 mg via ORAL
  Filled 2022-10-11: qty 2

## 2022-10-11 NOTE — ED Provider Notes (Signed)
[place] EMERGENCY DEPARTMENT AT [facility]  Provider Note  CSN: [id number] Arrival date & time: 10/10/22 2017  History Chief Complaint  Patient presents with   Toe Injury    Alyssa Sandoval is a 22 y.o. female at approx [redacted] wks gestation report she stubbed her toe on a piece of furniture yesterday. Has been having pain in R great toe up to her ankle since then, worse with weightbearing.    Home Medications Prior to Admission medications   Medication Sig Start Date End Date Taking? Authorizing Provider  bictegravir-emtricitabine-tenofovir AF (BIKTARVY) 50-200-25 MG TABS tablet Take 1 tablet by mouth daily. 08/29/22   Veryl Speak, FNP  cyclobenzaprine (FLEXERIL) 10 MG tablet Take 1 tablet (10 mg total) by mouth 2 (two) times daily as needed for muscle spasms. 09/05/22   Raelyn Mora, CNM  diphenhydrAMINE (BENADRYL) 25 mg capsule Take 25 mg by mouth every 6 (six) hours as needed for allergies. Patient not taking: Reported on 09/27/2022    [provider]  nitrofurantoin, macrocrystal-monohydrate, (MACROBID) 100 MG capsule Take 1 capsule (100 mg total) by mouth 2 (two) times daily. 09/14/22   Brand Males, CNM  Prenatal MV & Min w/FA-DHA (PRENATAL GUMMIES PO) Take by mouth.    [provider]  norelgestromin-ethinyl estradiol (ORTHO EVRA) 150-35 MCG/24HR transdermal patch Place 1 patch onto the skin once a week. 10/22/18 11/09/19  Felix Pacini A, DO     Allergies    Patient has no known allergies.   Review of Systems   Review of Systems Please see HPI for pertinent positives and negatives  Physical Exam BP 117/82   Pulse (!) 102   Temp 99 F (37.2 C) (Oral)   Resp 18   Ht 5' (1.524 m)   Wt 82.1 kg   LMP 04/18/2022 Comment: a month ago.  + home pregnancy test on 3/24  SpO2 99%   BMI 35.35 kg/m   Physical Exam Vitals and nursing note reviewed.  HENT:     Head: Normocephalic.     Nose: Nose normal.  Eyes:     Extraocular Movements:  Extraocular movements intact.  Pulmonary:     Effort: Pulmonary effort is normal.  Musculoskeletal:        General: Normal range of motion.     Cervical back: Neck supple.     Comments: Soft tissue swelling of R great toe, small amount of blood under nail but no subungual hematoma. No deformity.   Skin:    Findings: No rash (on exposed skin).  Neurological:     Mental Status: She is alert and oriented to person, place, and time.  Psychiatric:        Mood and Affect: Mood normal.     ED Results / Procedures / Treatments   EKG None  Procedures Procedures  Medications Ordered in the ED Medications  acetaminophen (TYLENOL) tablet 650 mg (has no administration in time range)    Initial Impression and Plan  Patient here with isolated toe injury. I personally viewed the images from radiology studies and agree with radiologist interpretation: Xray is neg for fx, no hematoma in need of drainage. Will apply buddy tape, post-op shoe for comfort. APAP for pain. Recommend ice and elevation. PCP follow up, RTED for any other concerns.    ED Course       MDM Rules/Calculators/A&P Medical Decision Making Problems Addressed: Toe injury, right, initial encounter: acute illness or injury  Amount and/or Complexity of Data  Reviewed Radiology: ordered and independent interpretation performed. Decision-making details documented in ED Course.  Risk OTC drugs.     Final Clinical Impression(s) / ED Diagnoses Final diagnoses:  Toe injury, right, initial encounter    Rx / DC Orders ED Discharge Orders     None        Pollyann Savoy, MD 10/11/22 [phone]

## 2022-10-17 ENCOUNTER — Other Ambulatory Visit: Payer: Self-pay

## 2022-10-17 ENCOUNTER — Ambulatory Visit: Payer: Medicaid Other | Admitting: Family

## 2022-10-17 ENCOUNTER — Other Ambulatory Visit: Payer: Medicaid Other

## 2022-10-17 ENCOUNTER — Ambulatory Visit (INDEPENDENT_AMBULATORY_CARE_PROVIDER_SITE_OTHER): Payer: Medicaid Other | Admitting: Obstetrics and Gynecology

## 2022-10-17 VITALS — BP 109/73 | HR 120 | Wt 181.0 lb

## 2022-10-17 DIAGNOSIS — O0990 Supervision of high risk pregnancy, unspecified, unspecified trimester: Secondary | ICD-10-CM

## 2022-10-17 DIAGNOSIS — Z3A26 26 weeks gestation of pregnancy: Secondary | ICD-10-CM

## 2022-10-17 DIAGNOSIS — O09899 Supervision of other high risk pregnancies, unspecified trimester: Secondary | ICD-10-CM

## 2022-10-17 DIAGNOSIS — O09892 Supervision of other high risk pregnancies, second trimester: Secondary | ICD-10-CM

## 2022-10-17 NOTE — Progress Notes (Signed)
   PRENATAL VISIT NOTE  Subjective:  Alyssa Sandoval is a 22 y.o. G3P1011 at [redacted]w[redacted]d being seen today for ongoing prenatal care.  She is currently monitored for the following issues for this high-risk pregnancy and has HSV (herpes simplex virus) anogenital infection; HIV positive (HCC); Supervision of high risk pregnancy, antepartum; Short interval between pregnancies affecting pregnancy, antepartum; and HIV (human immunodeficiency virus) risk factors complicating pregnancy on their problem list.  Patient doing well with no acute concerns today. She reports no complaints.  Contractions: Irritability. Vag. Bleeding: None.  Movement: Present. Denies leaking of fluid.   The following portions of the patient's history were reviewed and updated as appropriate: allergies, current medications, past family history, past medical history, past social history, past surgical history and problem list. Problem list updated.  Objective:   Vitals:   10/17/22 1150  BP: 109/73  Pulse: (!) 120  Weight: 181 lb (82.1 kg)    Fetal Status: Fetal Heart Rate (bpm): 154 Fundal Height: 25 cm Movement: Present     General:  Alert, oriented and cooperative. Patient is in no acute distress.  Skin: Skin is warm and dry. No rash noted.   Cardiovascular: Normal heart rate noted  Respiratory: Normal respiratory effort, no problems with respiration noted  Abdomen: Soft, gravid, appropriate for gestational age.  Pain/Pressure: Present     Pelvic: Cervical exam deferred        Extremities: Normal range of motion.  Edema: Mild pitting, slight indentation  Mental Status:  Normal mood and affect. Normal behavior. Normal judgment and thought content.   Assessment and Plan:  Pregnancy: G3P1011 at [redacted]w[redacted]d  1. [redacted] weeks gestation of pregnancy   2. Supervision of high risk pregnancy, antepartum Continue routine prenatal care  3. Short interval between pregnancies affecting pregnancy, antepartum   4. HIV risk factors affecting  pregnancy in second trimester Pt currently HIV positive, compliant with anti-virals  Preterm labor symptoms and general obstetric precautions including but not limited to vaginal bleeding, contractions, leaking of fluid and fetal movement were reviewed in detail with the patient.  Please refer to After Visit Summary for other counseling recommendations.   Return in about 2 weeks (around 10/31/2022) for [facility], in person.   Mariel Aloe, MD Faculty Attending Center for [facility]

## 2022-10-18 ENCOUNTER — Encounter: Payer: Medicaid Other | Admitting: Obstetrics and Gynecology

## 2022-10-18 ENCOUNTER — Other Ambulatory Visit: Payer: Medicaid Other

## 2022-10-18 LAB — CBC
Hematocrit: 31.6 % — ABNORMAL LOW (ref 34.0–46.6)
Hemoglobin: 10.1 g/dL — ABNORMAL LOW (ref 11.1–15.9)
MCH: 26.9 pg (ref 26.6–33.0)
MCHC: 32 g/dL (ref 31.5–35.7)
MCV: 84 fL (ref 79–97)
Platelets: 316 10*3/uL (ref 150–450)
RBC: 3.76 x10E6/uL — ABNORMAL LOW (ref 3.77–5.28)
RDW: 13.1 % (ref 11.7–15.4)
WBC: 10.9 10*3/uL — ABNORMAL HIGH (ref 3.4–10.8)

## 2022-10-18 LAB — GLUCOSE TOLERANCE, 2 HOURS W/ 1HR
Glucose, 1 hour: 134 mg/dL (ref 70–179)
Glucose, 2 hour: 157 mg/dL — ABNORMAL HIGH (ref 70–152)
Glucose, Fasting: 80 mg/dL (ref 70–91)

## 2022-10-18 LAB — HIV ANTIBODY (ROUTINE TESTING W REFLEX): HIV Screen 4th Generation wRfx: REACTIVE

## 2022-10-18 LAB — HIV 1/2 AB DIFFERENTIATION
HIV 1 Ab: REACTIVE
HIV 2 Ab: NONREACTIVE
NOTE (HIV CONF MULTIP: POSITIVE — AB

## 2022-10-18 LAB — RPR: RPR Ser Ql: NONREACTIVE

## 2022-10-19 ENCOUNTER — Encounter: Payer: Self-pay | Admitting: Advanced Practice Midwife

## 2022-10-19 DIAGNOSIS — O24419 Gestational diabetes mellitus in pregnancy, unspecified control: Secondary | ICD-10-CM | POA: Insufficient documentation

## 2022-10-19 DIAGNOSIS — Z8632 Personal history of gestational diabetes: Secondary | ICD-10-CM | POA: Insufficient documentation

## 2022-10-22 ENCOUNTER — Telehealth: Payer: Self-pay | Admitting: *Deleted

## 2022-10-22 MED ORDER — CYCLOBENZAPRINE HCL 10 MG PO TABS: 10.0000 mg | ORAL_TABLET | Freq: Two times a day (BID) | ORAL | 0 refills | Status: DC | PRN

## 2022-10-22 NOTE — Telephone Encounter (Signed)
-----   Message from Wynelle Bourgeois sent at 10/19/2022  3:50 AM EDT ----- Regarding: New diagnosis of GDM, needs class and teaching I am covering labs results for [place]  This patient's Glucola was abnormal   Can you notify her and get her scheduled for Diabetic teaching?  Thanks  Wynelle Bourgeois CNM

## 2022-10-22 NOTE — Telephone Encounter (Signed)
I called Alyssa Sandoval and informed her of results and recommendation per provider. She agreed to appointment on 11/01/22. She also asked if she could get  a refill of her flexeril. She states the provider at her last visit was supposed to and did not. Per review it has not been renewed since 09/05/22. I informed her I will ask provider today for approval of refill and send in today. She voices understanding.  Discussed with Dr. Briscoe Deutscher and refill approved and sent in. Nancy Fetter

## 2022-10-24 NOTE — Therapy (Signed)
OUTPATIENT PHYSICAL THERAPY THORACOLUMBAR EVALUATION   Patient Name: Alyssa Sandoval MRN: [id number] DOB:26-Oct-2000, 22 y.o., female Today's Date: 10/25/2022  END OF SESSION:  PT End of Session - 10/25/22 1402     Visit Number 1    Number of Visits 12    Authorization Type [employer]    Authorization Time Period 10/25/22 to 12/26/22    PT Start Time 1147    PT Stop Time 1232    PT Time Calculation (min) 45 min    Activity Tolerance Patient tolerated treatment well;No increased pain             Past Medical History:  Diagnosis Date   Chronic constipation 06/10/2012   COVID-19 04/2020   Depression    Dysfunctional uterine bleeding    GERD (gastroesophageal reflux disease) 06/10/2012   Headache 12/09/2020   Headache 12/09/2020   High-risk pregnancy 07/13/2021   HIV (human immunodeficiency virus infection) (HCC)    HIV (human immunodeficiency virus infection) (HCC)    HIV disease (HCC) 11/01/2020   -on Biktarvy, last viral load: 07/13/21: 12,000  12/19/21: not detected   EFW @ 28, 36wks     No testing        C/S @ 38wks (VL >1K) or IOL @ 39wks (VL <1K)   HSV (herpes simplex virus) anogenital infection    Low back pain 12/09/2020   Low back pain 12/09/2020   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   Passive suicidal ideations 11/07/2020   Passive suicidal ideations 11/07/2020   Polyhydramnios 02/13/2022   Postpartum hemorrhage 02/16/2022   Pregnancy induced hypertension    Pyelonephritis 08/19/2021   Admission 5/13-5/15   Urine cx poc 09/11/21 neg   Vaginal delivery 02/16/2022   Vaginal pruritus 03/15/2021   Vaginal yeast infection    Past Surgical History:  Procedure Laterality Date   NO PAST SURGERIES     Patient Active Problem List   Diagnosis Date Noted   Gestational diabetes 10/19/2022   HIV (human immunodeficiency virus) risk factors complicating pregnancy 10/17/2022   Supervision of high risk pregnancy, antepartum 07/17/2022   Short interval between  pregnancies affecting pregnancy, antepartum 07/17/2022   HIV positive (HCC) 01/29/2022   HSV (herpes simplex virus) anogenital infection 08/09/2021    PCP: none   REFERRING PROVIDER: Dorathy Kinsman, CNM  REFERRING DIAG:  [phone] (ICD-10-CM) - Pregnancy related hip pain in second trimester, antepartum  O26.892,M79.606 (ICD-10-CM) - Pregnancy related leg pain in second trimester, antepartum    Rationale for Evaluation and Treatment: Rehabilitation  THERAPY DIAG:  Other low back pain  Muscle weakness (generalized)  Muscle spasm of back  ONSET DATE: beginning of pregnancy approximately 6 months ago  SUBJECTIVE:  SUBJECTIVE STATEMENT: Pt states that she has been having sharp pain in the low back and hips since she became pregnant with her second child. She became pregnant shortly after she delivered her first baby, and is currently taking care of her 24 month old. She notes that when her pain is at its worst, she is having difficulty with picking him up. In addition, she has bilateral knee pain with walking. Pt notes she has a lot of swelling with this pregnancy and her feet with occasionally bother her. She has tried K-tape and belly bands but these only provide short term relief.  PERTINENT HISTORY:  Vaginal delivery 02/16/22 Currently [redacted] weeks pregnant  PAIN:  Are you having pain? Yes: NPRS scale: 0/10      max 10/10 can cause her to cry Pain location: low back Pain description: sharp shooting pain Aggravating factors: moving around, transitions, walking Relieving factors: hot epsom salt soak  PRECAUTIONS: Other: pregnant   RED FLAGS: None   WEIGHT BEARING RESTRICTIONS: No  FALLS:  Has patient fallen in last 6 months? No  LIVING ENVIRONMENT: Lives with: lives with their  family Lives in: House/apartment Stairs:  Has following equipment at home:   OCCUPATION: Stay at home mom  PLOF: Independent  PATIENT GOALS: better manage her pain  NEXT MD VISIT: regular OB visits   OBJECTIVE:   DIAGNOSTIC FINDINGS:    PATIENT SURVEYS:  ODI: 19/50 Moderate disability  SCREENING FOR RED FLAGS: Bowel or bladder incontinence: No Spinal tumors: No Cauda equina syndrome: No Compression fracture: No Abdominal aneurysm: No  COGNITION: Overall cognitive status: Within functional limits for tasks assessed     SENSATION: Not tested  MUSCLE LENGTH: Hamstrings: Right WNL deg; Left WNL deg Maisie Fus test: Right (+) hip flexor deg; Left (+) hip flexor deg  POSTURE: rounded shoulders, forward head, increased lumbar lordosis, increased thoracic kyphosis, and anterior pelvic tilt  PALPATION: Tenderness bilateral lumbar paraspinals L5 region  LUMBAR ROM:   AROM eval  Flexion Limited due to baby belly (+) pain low back  Extension   Right lateral flexion   Left lateral flexion   Right rotation WNL  Left rotation WNL   (Blank rows = not tested)  LOWER EXTREMITY ROM:     Passive  Right eval Left eval  Hip flexion WNL WNL  Hip extension    Hip abduction    Hip adduction    Hip internal rotation WNL WNL  Hip external rotation WNL WNL  Knee flexion    Knee extension    Ankle dorsiflexion    Ankle plantarflexion    Ankle inversion    Ankle eversion     (Blank rows = not tested)  LOWER EXTREMITY MMT:    MMT Right eval Left eval  Hip flexion 3 3  Hip extension 3 3  Hip abduction 3 3  Hip adduction    Hip internal rotation    Hip external rotation    Knee flexion    Knee extension    Ankle dorsiflexion    Ankle plantarflexion    Ankle inversion    Ankle eversion     (Blank rows = not tested)  LUMBAR SPECIAL TESTS:  Straight leg raise test: Positive and Trendelenburg sign: Positive  FUNCTIONAL TESTS:  Bed mobility Modified independent,  (+) coning noted with poor pressure management of abdominals  GAIT: Distance walked:  Assistive device utilized:  Level of assistance:  Comments: decreased hip extension bilateral  TODAY'S TREATMENT:  DATE:  No charge for treatment per insurance restrictions  Seated pelvic tilts and rolls for pain management Cat/cow HEP demo Quad tail wags HEP demo Log roll technique to avoid sit up position and excess strain on rectus abdominus   PATIENT EDUCATION:  Education details: eval findings/POC; HEP implemented; encouraged wearing compression socks, log roll, walking within pain free parameters Person educated: Patient Education method: Explanation, Verbal cues, and Handouts Education comprehension: verbalized understanding and returned demonstration  HOME EXERCISE PROGRAM: Access Code: [email or web address] URL: [email or web address] Date: 10/25/2022 Prepared by: [facility]  Exercises - Cat Cow  - 3 x daily - 7 x weekly - 1 sets - 10 reps - Tail Wag  - 3 x daily - 7 x weekly - 1 sets - 10 reps  Patient Education - Log Roll  ASSESSMENT:  CLINICAL IMPRESSION: Patient is a 22 y.o. F who was seen today for physical therapy evaluation and treatment for pregnancy related low back pain, bilateral hip pain and bilateral knee pain that has been present since she her pregnancy began approx 27 weeks ago. Pt has history of vaginal delivery last year and became pregnant again 2 months after. She has low back pain, bilateral hip pain and intermittent knee pain that is sharp in nature and limits her ability to ambulate, stand or perform ADLs and other upright activity without restriction. Pt's score on the ODI placed her in a moderate disability category. In addition, pt has proximal BLE weakness and poor trunk muscle strength  and coordination secondary to her current pregnancy and likely resulting from her previous pregnancy that was never addressed. She would benefit from skilled PT to address her ROM, strength and body mechanics restrictions and improve her quality of life and her ability to care for her infant son through the remainder of her pregnancy.   OBJECTIVE IMPAIRMENTS: decreased activity tolerance, decreased knowledge of condition, decreased mobility, difficulty walking, decreased strength, increased muscle spasms, impaired flexibility, improper body mechanics, postural dysfunction, and pain.   ACTIVITY LIMITATIONS: carrying, lifting, standing, transfers, bed mobility, locomotion level, and caring for others  PARTICIPATION LIMITATIONS: meal prep, cleaning, shopping, community activity, occupation, and yard work  PERSONAL FACTORS: Age, Fitness, Past/current experiences, Time since onset of injury/illness/exacerbation, and 1 comorbidity: previous pregnancy and time between pregnancies  are also affecting patient's functional outcome.   REHAB POTENTIAL: Good  CLINICAL DECISION MAKING: Evolving/moderate complexity  EVALUATION COMPLEXITY: Moderate   GOALS: Goals reviewed with patient? Yes  SHORT TERM GOALS: Target date: 11/06/22  Pt will be independent with initial HEP to increase flexibility and strength. Baseline: Goal status: INITIAL  Goal status: INITIAL    LONG TERM GOALS: Target date: 12/26/22  Pt will demo proper body mechanics with bed mobility and lifting, requiring minimal PT cues, to decrease strain on the low back. Baseline:  Goal status: INITIAL  2.  Pt will score less than 14 points on the ODI to reflect a decrease in overall disability and improve quality of life.  Baseline:  Goal status: INITIAL  3.  Pt will have increase in BLE strength to atleast 4/5 MMT which will improve her ability to ambulate and complete basic ADLs with less difficulty.  Baseline:  Goal status:  INITIAL  4.  Pt will be able to properly activate deep abdominals without the need for PT cuing, which will help her in the remainder of her pregnancy, during delivery, as well as recovery post partum. Baseline:  Goal status: INITIAL  5.  Pt will report atleast 25% reduction in sharp low back/hip pain from the start of PT to allow her to perform regular walking/light exercise for the remainder of her pregnancy.  Baseline:  Goal status: INITIAL    PLAN:  PT FREQUENCY: 1-2x/week  PT DURATION: 6 weeks  PLANNED INTERVENTIONS: Therapeutic exercises, Therapeutic activity, Neuromuscular re-education, Patient/Family education, Self Care, Joint mobilization, Aquatic Therapy, Dry Needling, Cryotherapy, Moist heat, Manual therapy, and Re-evaluation.  PLAN FOR NEXT SESSION: update HEP with deep abdominal and hip strength- pt may do better with mat exercises for home; lifting mechanics with caring for her infant at home; modalities and manual as needed for pain management  2:30 PM,10/25/22 Donita Brooks PT, DPT [facility] [place]  [phone]

## 2022-10-25 ENCOUNTER — Ambulatory Visit: Payer: MEDICAID | Attending: Advanced Practice Midwife | Admitting: Physical Therapy

## 2022-10-25 ENCOUNTER — Encounter: Payer: Self-pay | Admitting: Physical Therapy

## 2022-10-25 ENCOUNTER — Other Ambulatory Visit: Payer: Self-pay

## 2022-10-25 DIAGNOSIS — M5459 Other low back pain: Secondary | ICD-10-CM | POA: Diagnosis present

## 2022-10-25 DIAGNOSIS — M25559 Pain in unspecified hip: Secondary | ICD-10-CM | POA: Insufficient documentation

## 2022-10-25 DIAGNOSIS — O26892 Other specified pregnancy related conditions, second trimester: Secondary | ICD-10-CM | POA: Insufficient documentation

## 2022-10-25 DIAGNOSIS — M79606 Pain in leg, unspecified: Secondary | ICD-10-CM | POA: Insufficient documentation

## 2022-10-25 DIAGNOSIS — M62830 Muscle spasm of back: Secondary | ICD-10-CM | POA: Insufficient documentation

## 2022-10-25 DIAGNOSIS — M6281 Muscle weakness (generalized): Secondary | ICD-10-CM | POA: Diagnosis present

## 2022-10-29 ENCOUNTER — Ambulatory Visit: Payer: MEDICAID | Admitting: Family

## 2022-10-31 ENCOUNTER — Ambulatory Visit (INDEPENDENT_AMBULATORY_CARE_PROVIDER_SITE_OTHER): Payer: MEDICAID | Admitting: Obstetrics & Gynecology

## 2022-10-31 ENCOUNTER — Other Ambulatory Visit: Payer: Self-pay

## 2022-10-31 ENCOUNTER — Encounter: Payer: Self-pay | Admitting: Allergy & Immunology

## 2022-10-31 ENCOUNTER — Ambulatory Visit: Payer: MEDICAID | Admitting: Allergy & Immunology

## 2022-10-31 VITALS — BP 110/72 | HR 140 | Temp 98.2°F | Resp 18 | Ht 60.0 in | Wt 181.2 lb

## 2022-10-31 VITALS — BP 105/70 | HR 102 | Wt 181.0 lb

## 2022-10-31 DIAGNOSIS — O0990 Supervision of high risk pregnancy, unspecified, unspecified trimester: Secondary | ICD-10-CM

## 2022-10-31 DIAGNOSIS — L500 Allergic urticaria: Secondary | ICD-10-CM

## 2022-10-31 DIAGNOSIS — O09892 Supervision of other high risk pregnancies, second trimester: Secondary | ICD-10-CM

## 2022-10-31 DIAGNOSIS — L299 Pruritus, unspecified: Secondary | ICD-10-CM

## 2022-10-31 DIAGNOSIS — O24419 Gestational diabetes mellitus in pregnancy, unspecified control: Secondary | ICD-10-CM

## 2022-10-31 DIAGNOSIS — O0993 Supervision of high risk pregnancy, unspecified, third trimester: Secondary | ICD-10-CM

## 2022-10-31 DIAGNOSIS — Z3A28 28 weeks gestation of pregnancy: Secondary | ICD-10-CM

## 2022-10-31 DIAGNOSIS — O09893 Supervision of other high risk pregnancies, third trimester: Secondary | ICD-10-CM

## 2022-10-31 DIAGNOSIS — Z8759 Personal history of other complications of pregnancy, childbirth and the puerperium: Secondary | ICD-10-CM | POA: Insufficient documentation

## 2022-10-31 NOTE — Progress Notes (Signed)
Patient informed me that she's been having contractions. She stated that she would have a single contraction that would last one (1) minute and then would go away and another contraction would come 2 minutes later.  She explained that she's been having contractions "all the time". Denies any VB or LOF.

## 2022-10-31 NOTE — Progress Notes (Signed)
NEW PATIENT  Date of Service/Encounter:  10/31/22  Consult requested by: Pcp, No   Assessment:   Allergic urticaria with pruritus  Pruritic condition   Currently pregnant   HIV positive - on Biktarvy with good control  Plan/Recommendations:   1. Allergic urticaria - I am not going to do any skin testing since you do not have any allergic history. - I think this would be a waste of time. - We are going to get some labs instead to rule out weird causes of hives. - Alpha-Gal Panel - ANA, IFA (with reflex) - Chronic Urticaria - C-reactive protein - CMP14+EGFR - Sedimentation rate - Tryptase - Thyroid antibodies - CBC With Differential - Start Zyrtec (cetirizine) 10mg  1-2 times daily at the first sign of future outbreaks in combination with the hydrocortisone. - You can also give [place] as call to come in to be seen.   2. Return in about 6 months (around 05/03/2023). You can have the follow up appointment with Dr. Dellis Anes or a Nurse Practicioner (our Nurse Practitioners are excellent and always have Physician oversight!).    This note in its entirety was forwarded to the Provider who requested this consultation.  Subjective:   Alyssa Sandoval is a 22 y.o. female presenting today for evaluation of  Chief Complaint  Patient presents with   Angioedema    Feet and legs    Urticaria    No issues now - 2 months ago hive on her face not sure the cause     Alyssa Sandoval has a history of the following: Patient Active Problem List   Diagnosis Date Noted   History of postpartum hemorrhage 10/31/2022   Gestational diabetes 10/19/2022   HIV (human immunodeficiency virus) risk factors complicating pregnancy 10/17/2022   Supervision of high risk pregnancy, antepartum 07/17/2022   Short interval between pregnancies affecting pregnancy, antepartum 07/17/2022   HIV positive (HCC) 01/29/2022   HSV (herpes simplex virus) anogenital infection 08/09/2021    History obtained from:  chart review and patient.  Alyssa Sandoval was referred by Pcp, No.     Alyssa Sandoval is a 22 y.o. female presenting for an evaluation of urticaria .  She had hives in May 2024. This was over her face. This was mid day and then progressed throughout the day. She denies throat swelling. She had no vomiting or diarrhea. She had no not eaten anything new and no new medications, She had to get a steroid shot and this slowly made it decrease over a couple of weeks or so. This did not leave permanent skin changes at all.   She had hives in the past, but it was never to the same extent. These have occurred randomly, but it resolved with hydrocortisone 1%. She tolerates all of the major food allergens without a problem. She did go to the [place] and this was the only change. She has been to that location multiple times without a problem.   Of note, she is around 6 months pregnant.   She has a history of HIV Musician). She follows up monthly for this.   Otherwise, there is no history of other atopic diseases, including asthma, food allergies, drug allergies, environmental allergies, stinging insect allergies, eczema, or contact dermatitis. There is no significant infectious history. Vaccinations are up to date.    Past Medical History: Patient Active Problem List   Diagnosis Date Noted   History of postpartum hemorrhage 10/31/2022   Gestational diabetes 10/19/2022   HIV (human immunodeficiency virus)  risk factors complicating pregnancy 10/17/2022   Supervision of high risk pregnancy, antepartum 07/17/2022   Short interval between pregnancies affecting pregnancy, antepartum 07/17/2022   HIV positive (HCC) 01/29/2022   HSV (herpes simplex virus) anogenital infection 08/09/2021    Medication List:  Allergies as of 10/31/2022   No Known Allergies      Medication List        Accurate as of October 31, 2022  3:16 PM. If you have any questions, ask your nurse or doctor.          Biktarvy 50-200-25  MG Tabs tablet Generic drug: bictegravir-emtricitabine-tenofovir AF Take 1 tablet by mouth daily.   cyclobenzaprine 10 MG tablet Commonly known as: FLEXERIL Take 1 tablet (10 mg total) by mouth 2 (two) times daily as needed for muscle spasms.   diphenhydrAMINE 25 mg capsule Commonly known as: BENADRYL Take 25 mg by mouth every 6 (six) hours as needed for allergies.   PRENATAL GUMMIES PO Take by mouth.        Birth History: non-contributory  Developmental History: non-contributory  Past Surgical History: Past Surgical History:  Procedure Laterality Date   NO PAST SURGERIES       Family History: Family History  Problem Relation Age of Onset   Obesity Mother    Fibromyalgia Mother    Mental illness Mother    Other Father        not to her knowledge, re med problems   Hypertension Maternal Grandmother    Colon cancer Maternal Grandfather      Social History: Alyssa Sandoval lives at home with her family. She lives in a house.  There is no reports that the home.  She has gas heating and central cooling.  There is a dog and a cat inside of the home.  There are no dust mite covers on the bedding.  There is no tobacco exposure.  She currently is not working.   Review of systems otherwise negative other than that mentioned in the HPI.    Objective:   Blood pressure 110/72, pulse (!) 140, temperature 98.2 F (36.8 C), resp. rate 18, height 5' (1.524 m), weight 181 lb 3.2 oz (82.2 kg), last menstrual period 04/18/2022, SpO2 97%, not currently breastfeeding. Body mass index is 35.39 kg/m.     Physical Exam Vitals reviewed.  Constitutional:      Appearance: She is well-developed.  HENT:     Head: Normocephalic and atraumatic.     Right Ear: Tympanic membrane, ear canal and external ear normal. No drainage, swelling or tenderness. Tympanic membrane is not injected, scarred, erythematous, retracted or bulging.     Left Ear: Tympanic membrane, ear canal and external ear  normal. No drainage, swelling or tenderness. Tympanic membrane is not injected, scarred, erythematous, retracted or bulging.     Nose: No nasal deformity, septal deviation, mucosal edema or rhinorrhea.     Right Turbinates: Enlarged and swollen.     Left Turbinates: Enlarged and swollen.     Right Sinus: No maxillary sinus tenderness or frontal sinus tenderness.     Left Sinus: No maxillary sinus tenderness or frontal sinus tenderness.     Mouth/Throat:     Mouth: Mucous membranes are not pale and not dry.     Pharynx: Uvula midline.  Eyes:     General:        Right eye: No discharge.        Left eye: No discharge.     Conjunctiva/sclera: Conjunctivae normal.  Right eye: Right conjunctiva is not injected. No chemosis.    Left eye: Left conjunctiva is not injected. No chemosis.    Pupils: Pupils are equal, round, and reactive to light.  Cardiovascular:     Rate and Rhythm: Normal rate and regular rhythm.     Heart sounds: Normal heart sounds.  Pulmonary:     Effort: Pulmonary effort is normal. No tachypnea, accessory muscle usage or respiratory distress.     Breath sounds: Normal breath sounds. No wheezing, rhonchi or rales.  Chest:     Chest wall: No tenderness.  Abdominal:     Tenderness: There is no abdominal tenderness. There is no guarding or rebound.     Comments: Gravid uterus.  Lymphadenopathy:     Head:     Right side of head: No submandibular, tonsillar or occipital adenopathy.     Left side of head: No submandibular, tonsillar or occipital adenopathy.     Cervical: No cervical adenopathy.  Skin:    General: Skin is warm.     Capillary Refill: Capillary refill takes less than 2 seconds.     Coloration: Skin is not pale.     Findings: No abrasion, erythema, petechiae or rash. Rash is not papular, urticarial or vesicular.     Comments: No urticaria. Multiple tattoos.   Neurological:     Mental Status: She is alert.  Psychiatric:        Behavior: Behavior is  cooperative.      Diagnostic studies: labs sent instead        Malachi Bonds, MD [facility]

## 2022-10-31 NOTE — Patient Instructions (Addendum)
1. Allergic urticaria - I am not going to do any skin testing since you do not have any allergic history. - I think this would be a waste of time. - We are going to get some labs instead to rule out weird causes of hives. - Alpha-Gal Panel - ANA, IFA (with reflex) - Chronic Urticaria - C-reactive protein - CMP14+EGFR - Sedimentation rate - Tryptase - Thyroid antibodies - CBC With Differential - Start Zyrtec (cetirizine) 10mg  1-2 times daily at the first sign of future outbreaks in combination with the hydrocortisone. - You can also give [place] as call to come in to be seen.   2. Return in about 6 months (around 05/03/2023). You can have the follow up appointment with Dr. Dellis Anes or a Nurse Practicioner (our Nurse Practitioners are excellent and always have Physician oversight!).    Please inform us of any Emergency Department visits, hospitalizations, or changes in symptoms. Call us before going to the ED for breathing or allergy symptoms since we might be able to fit you in for a sick visit. Feel free to contact us anytime with any questions, problems, or concerns.  It was a pleasure to meet you today! Good luck with the rest of the pregnancy!   Websites that have reliable patient information: 1. American Academy of Asthma, Allergy, and Immunology: www.aaaai.org 2. Food Allergy Research and Education (FARE): foodallergy.org 3. Mothers of Asthmatics: http://www.asthmacommunitynetwork.org 4. American College of Allergy, Asthma, and Immunology: www.acaai.org   COVID-19 Vaccine Information can be found at: [email or web address] For questions related to vaccine distribution or appointments, please email [email or web address] or call [phone].   We realize that you might be concerned about having an allergic reaction to the COVID19 vaccines. To help with that concern, WE ARE OFFERING THE COVID19 VACCINES IN OUR OFFICE! Ask the front  desk for dates!     "Like" [place] on Facebook and Instagram for our latest updates!      A healthy democracy works best when [employer] participate! Make sure you are registered to vote! If you have moved or changed any of your contact information, you will need to get this updated before voting!  In some cases, you MAY be able to register to vote online: [email or web address]

## 2022-10-31 NOTE — Progress Notes (Signed)
   PRENATAL VISIT NOTE  Subjective:  Alyssa Sandoval is a 22 y.o. G3P1011 at [redacted]w[redacted]d being seen today for ongoing prenatal care.  She is currently monitored for the following issues for this high-risk pregnancy and has HSV (herpes simplex virus) anogenital infection; HIV positive (HCC); Supervision of high risk pregnancy, antepartum; Short interval between pregnancies affecting pregnancy, antepartum; HIV (human immunodeficiency virus) risk factors complicating pregnancy; Gestational diabetes; and History of postpartum hemorrhage on their problem list.  Patient reports no complaints.  Contractions: Irritability. Vag. Bleeding: None.  Movement: Present. Denies leaking of fluid.   The following portions of the patient's history were reviewed and updated as appropriate: allergies, current medications, past family history, past medical history, past social history, past surgical history and problem list.   Objective:   Vitals:   10/31/22 1553  BP: 105/70  Pulse: (!) 102  Weight: 181 lb (82.1 kg)    Fetal Status: Fetal Heart Rate (bpm): 138   Movement: Present     General:  Alert, oriented and cooperative. Patient is in no acute distress.  Skin: Skin is warm and dry. No rash noted.   Cardiovascular: Normal heart rate noted  Respiratory: Normal respiratory effort, no problems with respiration noted  Abdomen: Soft, gravid, appropriate for gestational age.  Pain/Pressure: Present     Pelvic: Cervical exam deferred        Extremities: Normal range of motion.     Mental Status: Normal mood and affect. Normal behavior. Normal judgment and thought content.   Assessment and Plan:  Pregnancy: G3P1011 at [redacted]w[redacted]d 1. Gestational diabetes mellitus (GDM) in third trimester, gestational diabetes method of control unspecified Has diabetes education tomorrow  2. HIV risk factors affecting pregnancy in second trimester Needs to reschedule her ID f/u  3. Supervision of high risk pregnancy, antepartum Growth  [place] tomorrow  Preterm labor symptoms and general obstetric precautions including but not limited to vaginal bleeding, contractions, leaking of fluid and fetal movement were reviewed in detail with the patient. Please refer to After Visit Summary for other counseling recommendations.   Return in about 2 weeks (around 11/14/2022).  Future Appointments  Date Time Provider Department Center  11/01/2022  2:15 PM [facility]  11/01/2022  3:15 PM [facility] NURSE [facility]  11/01/2022  3:30 PM [facility] US3 [facility]  11/14/2022  4:15 PM Mikey Kirschner B, PT [facility] None  11/21/2022  2:45 PM Vernell Barrier, PT [facility] None  11/28/2022  3:30 PM Vernell Barrier, PT [facility] None  12/05/2022  2:45 PM Vernell Barrier, PT [facility] None  12/12/2022  2:45 PM Vernell Barrier, PT [facility] None  12/19/2022  2:45 PM Vernell Barrier, PT [facility] None  05/03/2023  3:30 PM Dellis Anes Hetty Ely, MD [facility] None    Scheryl Darter, MD

## 2022-11-01 ENCOUNTER — Ambulatory Visit (INDEPENDENT_AMBULATORY_CARE_PROVIDER_SITE_OTHER): Payer: MEDICAID | Admitting: Skilled Nursing Facility1

## 2022-11-01 ENCOUNTER — Ambulatory Visit: Payer: MEDICAID | Admitting: *Deleted

## 2022-11-01 ENCOUNTER — Ambulatory Visit: Payer: MEDICAID | Attending: Obstetrics and Gynecology

## 2022-11-01 ENCOUNTER — Encounter: Payer: Self-pay | Admitting: *Deleted

## 2022-11-01 VITALS — BP 110/68 | HR 94

## 2022-11-01 VITALS — Ht 60.0 in | Wt 180.5 lb

## 2022-11-01 DIAGNOSIS — Z3A28 28 weeks gestation of pregnancy: Secondary | ICD-10-CM

## 2022-11-01 DIAGNOSIS — B20 Human immunodeficiency virus [HIV] disease: Secondary | ICD-10-CM | POA: Insufficient documentation

## 2022-11-01 DIAGNOSIS — O98713 Human immunodeficiency virus [HIV] disease complicating pregnancy, third trimester: Secondary | ICD-10-CM | POA: Diagnosis not present

## 2022-11-01 DIAGNOSIS — O0990 Supervision of high risk pregnancy, unspecified, unspecified trimester: Secondary | ICD-10-CM | POA: Diagnosis present

## 2022-11-01 DIAGNOSIS — O99212 Obesity complicating pregnancy, second trimester: Secondary | ICD-10-CM | POA: Diagnosis present

## 2022-11-01 DIAGNOSIS — O09899 Supervision of other high risk pregnancies, unspecified trimester: Secondary | ICD-10-CM | POA: Insufficient documentation

## 2022-11-01 DIAGNOSIS — O98813 Other maternal infectious and parasitic diseases complicating pregnancy, third trimester: Secondary | ICD-10-CM | POA: Diagnosis not present

## 2022-11-01 DIAGNOSIS — B009 Herpesviral infection, unspecified: Secondary | ICD-10-CM | POA: Diagnosis not present

## 2022-11-01 DIAGNOSIS — O24419 Gestational diabetes mellitus in pregnancy, unspecified control: Secondary | ICD-10-CM

## 2022-11-01 DIAGNOSIS — E669 Obesity, unspecified: Secondary | ICD-10-CM

## 2022-11-01 NOTE — Progress Notes (Signed)
Patient was seen for Gestational Diabetes self-management on 11/01/2022  Start time 1:59 and End time 3:00   Estimated due date: [redacted]w[redacted]d  Lab Results  Component Value Date   HGBA1C 5.4 07/23/2022   Wt Readings from Last 3 Encounters:  10/31/22 181 lb (82.1 kg)  10/31/22 181 lb 3.2 oz (82.2 kg)  10/17/22 181 lb (82.1 kg)    Clinical: Medications: see list Medical History: HIV, GDM,  Labs: OGTT WNL except for 2 hr 157  Dietary and Lifestyle History:  Pt states she has started PT for weak hips but has not been doing the exercises at home.   Physical Activity: ADL Stress: low stress level Sleep: interrupted sleep due to discomfort   24 hr Recall: wakes around 8-9am; adds white sugar to most meals First Meal 9am: sausage, eggs, pancakes or cucumber, celery, red hot sausages  Snack:  Second meal: (snacked on throughout the day) chips, apple + caramel, fruits or pizza or burger or chicken  Snack: chips and fruit or Sandwich or hot dog or canned spaghetti or noodles Third meal: wings or vegetarian lasagna  Snack: after having gone to bed: the rest of dinner Beverages: soda, juice, Gatorade (246 g of sugar from soda alone)  NUTRITION INTERVENTION  Nutrition education (E-1) on the following topics:   Initial Follow-up  [x]  []  Definition of Gestational Diabetes [x]  []  Why dietary management is important in controlling blood glucose [x]  []  Effects each nutrient has on blood glucose levels [x]  []  Simple carbohydrates vs complex carbohydrates [x]  []  Fluid intake [x]  []  Creating a balanced meal plan [x]  []  Carbohydrate counting  [x]  []  When to check blood glucose levels [x]  []  Proper blood glucose monitoring techniques [x]  []  Effect of stress and stress reduction techniques  [x]  []  Exercise effect on blood glucose levels, appropriate exercise during pregnancy [x]  []  Importance of limiting caffeine and abstaining from alcohol and smoking [x]  []  Medications used for blood sugar  control during pregnancy [x]  []  Hypoglycemia and rule of 15 [x]  []  Postpartum self care  Blood glucose monitor given: accucheck guide me Lot # [id number] Exp: [id number] CBG: 89 mg/dL   Patient instructed to monitor glucose levels: FBS: 60 - ? 95 mg/dL; 2 hour: ? 621 mg/dL  Goals: Be sure to do your PT exercises at home Stop adding sugar to your foods Limit to 3 sugary beverages per day Drink more water  Patient received handouts: Nutrition Diabetes and Pregnancy Carbohydrate Counting List  Patient will be seen for follow-up as needed.

## 2022-11-02 ENCOUNTER — Other Ambulatory Visit: Payer: Self-pay | Admitting: *Deleted

## 2022-11-02 DIAGNOSIS — Z8619 Personal history of other infectious and parasitic diseases: Secondary | ICD-10-CM

## 2022-11-08 ENCOUNTER — Ambulatory Visit: Payer: MEDICAID | Attending: Maternal & Fetal Medicine

## 2022-11-08 ENCOUNTER — Other Ambulatory Visit: Payer: Self-pay | Admitting: *Deleted

## 2022-11-08 DIAGNOSIS — O99213 Obesity complicating pregnancy, third trimester: Secondary | ICD-10-CM

## 2022-11-08 DIAGNOSIS — O98713 Human immunodeficiency virus [HIV] disease complicating pregnancy, third trimester: Secondary | ICD-10-CM | POA: Diagnosis not present

## 2022-11-08 DIAGNOSIS — Z8619 Personal history of other infectious and parasitic diseases: Secondary | ICD-10-CM | POA: Diagnosis present

## 2022-11-08 DIAGNOSIS — B20 Human immunodeficiency virus [HIV] disease: Secondary | ICD-10-CM

## 2022-11-08 DIAGNOSIS — I3139 Other pericardial effusion (noninflammatory): Secondary | ICD-10-CM

## 2022-11-08 DIAGNOSIS — E669 Obesity, unspecified: Secondary | ICD-10-CM

## 2022-11-08 DIAGNOSIS — O24410 Gestational diabetes mellitus in pregnancy, diet controlled: Secondary | ICD-10-CM

## 2022-11-08 DIAGNOSIS — Z3A29 29 weeks gestation of pregnancy: Secondary | ICD-10-CM

## 2022-11-08 DIAGNOSIS — O98813 Other maternal infectious and parasitic diseases complicating pregnancy, third trimester: Secondary | ICD-10-CM

## 2022-11-08 DIAGNOSIS — B009 Herpesviral infection, unspecified: Secondary | ICD-10-CM

## 2022-11-08 DIAGNOSIS — O24419 Gestational diabetes mellitus in pregnancy, unspecified control: Secondary | ICD-10-CM

## 2022-11-09 ENCOUNTER — Encounter: Payer: Self-pay | Admitting: Obstetrics & Gynecology

## 2022-11-09 DIAGNOSIS — O24410 Gestational diabetes mellitus in pregnancy, diet controlled: Secondary | ICD-10-CM

## 2022-11-12 MED ORDER — ACCU-CHEK SOFTCLIX LANCETS MISC
6 refills | Status: DC
Start: 2022-11-12 — End: 2023-01-12

## 2022-11-12 MED ORDER — ACCU-CHEK GUIDE VI STRP
ORAL_STRIP | 6 refills | Status: DC
Start: 2022-11-12 — End: 2023-01-12

## 2022-11-14 ENCOUNTER — Ambulatory Visit: Payer: MEDICAID

## 2022-11-15 ENCOUNTER — Other Ambulatory Visit: Payer: Self-pay

## 2022-11-15 ENCOUNTER — Encounter: Payer: MEDICAID | Admitting: Family Medicine

## 2022-11-15 ENCOUNTER — Ambulatory Visit: Payer: MEDICAID

## 2022-11-15 ENCOUNTER — Ambulatory Visit: Payer: MEDICAID | Admitting: Family Medicine

## 2022-11-15 ENCOUNTER — Encounter: Payer: Self-pay | Admitting: Family Medicine

## 2022-11-15 VITALS — BP 106/72 | HR 112 | Wt 179.0 lb

## 2022-11-15 DIAGNOSIS — O24419 Gestational diabetes mellitus in pregnancy, unspecified control: Secondary | ICD-10-CM

## 2022-11-15 DIAGNOSIS — O09899 Supervision of other high risk pregnancies, unspecified trimester: Secondary | ICD-10-CM

## 2022-11-15 DIAGNOSIS — Z8759 Personal history of other complications of pregnancy, childbirth and the puerperium: Secondary | ICD-10-CM

## 2022-11-15 DIAGNOSIS — O368130 Decreased fetal movements, third trimester, not applicable or unspecified: Secondary | ICD-10-CM

## 2022-11-15 DIAGNOSIS — Z3A30 30 weeks gestation of pregnancy: Secondary | ICD-10-CM

## 2022-11-15 DIAGNOSIS — A609 Anogenital herpesviral infection, unspecified: Secondary | ICD-10-CM

## 2022-11-15 DIAGNOSIS — O09893 Supervision of other high risk pregnancies, third trimester: Secondary | ICD-10-CM

## 2022-11-15 DIAGNOSIS — O0990 Supervision of high risk pregnancy, unspecified, unspecified trimester: Secondary | ICD-10-CM

## 2022-11-15 MED ORDER — METFORMIN HCL 500 MG PO TABS
500.0000 mg | ORAL_TABLET | Freq: Every day | ORAL | 2 refills | Status: AC
Start: 2022-11-15 — End: ?

## 2022-11-15 MED ORDER — VALACYCLOVIR HCL 500 MG PO TABS
500.0000 mg | ORAL_TABLET | Freq: Two times a day (BID) | ORAL | 2 refills | Status: DC
Start: 2022-11-15 — End: 2022-12-25

## 2022-11-15 NOTE — Progress Notes (Signed)
PRENATAL VISIT NOTE  Subjective:  Alyssa Sandoval is a 22 y.o. G3P1011 at [redacted]w[redacted]d being seen today for ongoing prenatal care.  She is currently monitored for the following issues for this high-risk pregnancy and has HSV (herpes simplex virus) anogenital infection; HIV positive (HCC); Supervision of high risk pregnancy, antepartum; Short interval between pregnancies affecting pregnancy, antepartum; HIV (human immunodeficiency virus) risk factors complicating pregnancy; Gestational diabetes; and History of postpartum hemorrhage on their problem list.  Patient reports no complaints.  Contractions: Irritability. Vag. Bleeding: None.  Movement: (!) Decreased. Denies leaking of fluid.   The following portions of the patient's history were reviewed and updated as appropriate: allergies, current medications, past family history, past medical history, past social history, past surgical history and problem list.   Objective:   Vitals:   11/15/22 0927  BP: 106/72  Pulse: (!) 112  Weight: 179 lb (81.2 kg)    Fetal Status:     Movement: (!) Decreased     General:  Alert, oriented and cooperative. Patient is in no acute distress.  Skin: Skin is warm and dry. No rash noted.   Cardiovascular: Normal heart rate noted  Respiratory: Normal respiratory effort, no problems with respiration noted  Abdomen: Soft, gravid, appropriate for gestational age.  Pain/Pressure: Present     Pelvic: Cervical exam deferred        Extremities: Normal range of motion.  Edema: Trace  Mental Status: Normal mood and affect. Normal behavior. Normal judgment and thought content.   Assessment and Plan:  Pregnancy: G3P1011 at [redacted]w[redacted]d 1. [redacted] weeks gestation of pregnancy   2. Gestational diabetes mellitus (GDM) in third trimester, gestational diabetes method of control unspecified NO book, reports  FBS 90-104 (? If true fasting) 2 hour pp 120-140 Begin Metformin q hs - metFORMIN (GLUCOPHAGE) 500 MG tablet; Take 1 tablet  (500 mg total) by mouth at bedtime.  Dispense: 60 tablet; Refill: 2  3. Supervision of high risk pregnancy, antepartum   4. Short interval between pregnancies affecting pregnancy, antepartum   5. HSV (herpes simplex virus) anogenital infection Begin suppression - valACYclovir (VALTREX) 500 MG tablet; Take 1 tablet (500 mg total) by mouth 2 (two) times daily.  Dispense: 180 tablet; Refill: 2  6. History of postpartum hemorrhage   7. HIV risk factors affecting pregnancy in third trimester On Biktarvy, needs f/u with ID and more recent viral load  8. Decreased fetal movements in third trimester, single or unspecified fetus NST:  Baseline: 150 bpm, Variability: Good {> 6 bpm), Accelerations: Non-reactive but appropriate for gestational age, and Decelerations: Variable: mild BPP 8/10 - US FETAL BPP W/NONSTRESS; Future  Preterm labor symptoms and general obstetric precautions including but not limited to vaginal bleeding, contractions, leaking of fluid and fetal movement were reviewed in detail with the patient. Please refer to After Visit Summary for other counseling recommendations.   Return in 2 weeks (on 11/29/2022).  Future Appointments  Date Time Provider Department Center  11/21/2022  2:45 PM Vernell Barrier, PT [facility] None  11/22/2022  2:30 PM [facility] NURSE [facility]  11/22/2022  2:45 PM [facility] US6 [facility]  11/28/2022  3:30 PM Vernell Barrier, PT [facility] None  11/29/2022  1:45 PM [facility] US5 [facility]  12/04/2022  3:15 PM Adam Phenix, MD [facility]  12/05/2022  2:45 PM Vernell Barrier, PT [facility] None  12/12/2022  2:45 PM Vernell Barrier, PT [facility] None  12/19/2022  2:45 PM Vernell Barrier, PT  [facility] None  05/03/2023  3:30 PM Dellis Anes Hetty Ely, MD [facility] None    Reva Bores, MD

## 2022-11-21 ENCOUNTER — Ambulatory Visit: Payer: MEDICAID

## 2022-11-22 ENCOUNTER — Other Ambulatory Visit: Payer: Self-pay

## 2022-11-22 ENCOUNTER — Inpatient Hospital Stay (HOSPITAL_COMMUNITY)
Admission: AD | Admit: 2022-11-22 | Discharge: 2022-11-22 | Disposition: A | Discharge status: Home / Self Care | Payer: MEDICAID | Attending: Obstetrics & Gynecology | Admitting: Obstetrics & Gynecology

## 2022-11-22 ENCOUNTER — Ambulatory Visit: Payer: MEDICAID

## 2022-11-22 ENCOUNTER — Encounter (HOSPITAL_COMMUNITY): Payer: Self-pay | Admitting: Obstetrics & Gynecology

## 2022-11-22 DIAGNOSIS — O99891 Other specified diseases and conditions complicating pregnancy: Secondary | ICD-10-CM | POA: Insufficient documentation

## 2022-11-22 DIAGNOSIS — O26893 Other specified pregnancy related conditions, third trimester: Secondary | ICD-10-CM | POA: Diagnosis not present

## 2022-11-22 DIAGNOSIS — O99613 Diseases of the digestive system complicating pregnancy, third trimester: Secondary | ICD-10-CM | POA: Diagnosis not present

## 2022-11-22 DIAGNOSIS — B20 Human immunodeficiency virus [HIV] disease: Secondary | ICD-10-CM | POA: Insufficient documentation

## 2022-11-22 DIAGNOSIS — Z3A31 31 weeks gestation of pregnancy: Secondary | ICD-10-CM | POA: Diagnosis not present

## 2022-11-22 DIAGNOSIS — O09293 Supervision of pregnancy with other poor reproductive or obstetric history, third trimester: Secondary | ICD-10-CM | POA: Insufficient documentation

## 2022-11-22 DIAGNOSIS — N898 Other specified noninflammatory disorders of vagina: Secondary | ICD-10-CM

## 2022-11-22 DIAGNOSIS — R102 Pelvic and perineal pain: Secondary | ICD-10-CM | POA: Diagnosis present

## 2022-11-22 DIAGNOSIS — Z8616 Personal history of COVID-19: Secondary | ICD-10-CM | POA: Insufficient documentation

## 2022-11-22 DIAGNOSIS — Z87891 Personal history of nicotine dependence: Secondary | ICD-10-CM | POA: Diagnosis not present

## 2022-11-22 DIAGNOSIS — O98513 Other viral diseases complicating pregnancy, third trimester: Secondary | ICD-10-CM | POA: Insufficient documentation

## 2022-11-22 DIAGNOSIS — R1031 Right lower quadrant pain: Secondary | ICD-10-CM | POA: Insufficient documentation

## 2022-11-22 DIAGNOSIS — N949 Unspecified condition associated with female genital organs and menstrual cycle: Secondary | ICD-10-CM

## 2022-11-22 DIAGNOSIS — O98713 Human immunodeficiency virus [HIV] disease complicating pregnancy, third trimester: Secondary | ICD-10-CM | POA: Diagnosis not present

## 2022-11-22 LAB — URINALYSIS, MICROSCOPIC (REFLEX)

## 2022-11-22 LAB — URINALYSIS, ROUTINE W REFLEX MICROSCOPIC
Bilirubin Urine: NEGATIVE
Glucose, UA: NEGATIVE mg/dL
Hgb urine dipstick: NEGATIVE
Ketones, ur: 15 mg/dL — AB
Nitrite: NEGATIVE
Protein, ur: NEGATIVE mg/dL
Specific Gravity, Urine: 1.02 (ref 1.005–1.030)
pH: 7 (ref 5.0–8.0)

## 2022-11-22 LAB — WET PREP, GENITAL
Clue Cells Wet Prep HPF POC: NONE SEEN
Sperm: NONE SEEN
Trich, Wet Prep: NONE SEEN
WBC, Wet Prep HPF POC: >=10 — AB (ref <10)
Yeast Wet Prep HPF POC: NONE SEEN

## 2022-11-22 MED ORDER — CYCLOBENZAPRINE HCL 10 MG PO TABS: 10.0000 mg | ORAL_TABLET | Freq: Three times a day (TID) | ORAL | 0 refills | Status: DC | PRN

## 2022-11-22 MED ORDER — ACETAMINOPHEN 500 MG PO TABS
1000.0000 mg | ORAL_TABLET | Freq: Once | ORAL | Status: AC
  Administered 2022-11-22: 1000 mg via ORAL
  Filled 2022-11-22: qty 2

## 2022-11-22 MED ORDER — CYCLOBENZAPRINE HCL 5 MG PO TABS
10.0000 mg | ORAL_TABLET | Freq: Once | ORAL | Status: AC
  Administered 2022-11-22: 10 mg via ORAL
  Filled 2022-11-22: qty 2

## 2022-11-22 MED ORDER — ACETAMINOPHEN 500 MG PO TABS: 1000.0000 mg | ORAL_TABLET | Freq: Three times a day (TID) | ORAL | 2 refills | Status: AC | PRN

## 2022-11-22 NOTE — MAU Provider Note (Signed)
Chief Complaint: Abdominal Pain and Buttock Pain   Event Date/Time   First Provider Initiated Contact with Patient 11/22/22 1315      SUBJECTIVE HPI: Alyssa Sandoval is a 22 y.o. G3P1011 at [redacted]w[redacted]d by LMP who presents to maternity admissions reporting R sided flank/groin pain and pink vaginal discharge. Last night started having sharp pain in her groin. Warm bath helped. Took 2 flexeril and went to sleep. Pain back this AM. Scant pink discharge yesterday. Had intercourse the day before. No itching/leaking/urinary symptoms. FM+.  Pregnancy c/b HIV, A2GDM, hx HSV.    HPI  Past Medical History:  Diagnosis Date   Angio-edema    Chronic constipation 06/10/2012   COVID-19 04/2020   Depression    Dysfunctional uterine bleeding    GERD (gastroesophageal reflux disease) 06/10/2012   Headache 12/09/2020   Headache 12/09/2020   High-risk pregnancy 07/13/2021   HIV (human immunodeficiency virus infection) (HCC)    HIV (human immunodeficiency virus infection) (HCC)    HIV disease (HCC) 11/01/2020   -on Biktarvy, last viral load: 07/13/21: 12,000  12/19/21: not detected   EFW @ 28, 36wks     No testing        C/S @ 38wks (VL >1K) or IOL @ 39wks (VL <1K)   HSV (herpes simplex virus) anogenital infection    Low back pain 12/09/2020   Low back pain 12/09/2020   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   Passive suicidal ideations 11/07/2020   Passive suicidal ideations 11/07/2020   Polyhydramnios 02/13/2022   Postpartum hemorrhage 02/16/2022   Pregnancy induced hypertension    Pyelonephritis 08/19/2021   Admission 5/13-5/15   Urine cx poc 09/11/21 neg   Urticaria    Vaginal delivery 02/16/2022   Vaginal pruritus 03/15/2021   Vaginal yeast infection    Past Surgical History:  Procedure Laterality Date   NO PAST SURGERIES     Social History   Socioeconomic History   Marital status: Single    Spouse name: Not on file   Number of children: Not on file   Years of education: Not on file   Highest  education level: Not on file  Occupational History   Not on file  Tobacco Use   Smoking status: Former    Types: Cigars, E-cigarettes   Smokeless tobacco: Never   Tobacco comments:    vapes  Vaping Use   Vaping status: Former   Substances: Nicotine, CBD, Flavoring  Substance and Sexual Activity   Alcohol use: No   Drug use: Not Currently    Types: Marijuana    Comment: more than a year ago   Sexual activity: Yes    Partners: Male    Birth control/protection: None    Comment: declined condoms  Other Topics Concern   Not on file  Social History Narrative   Marital status/children/pets: Single.    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      Social Determinants of Health   Financial Resource Strain: Low Risk  (08/09/2021)   Overall Financial Resource Strain (CARDIA)    Difficulty of Paying Living Expenses: Not hard at all  Food Insecurity: No Food Insecurity (02/13/2022)   Hunger Vital Sign    Worried About Running Out of Food in the Last Year: Never true    Ran Out of Food in the Last Year: Never true  Transportation Needs: No Transportation Needs (02/13/2022)   PRAPARE - Transportation    Lack of Transportation (Medical): No    Lack  of Transportation (Non-Medical): No  Physical Activity: Inactive (08/09/2021)   Exercise Vital Sign    Days of Exercise per Week: 0 days    Minutes of Exercise per Session: 0 min  Stress: No Stress Concern Present (08/09/2021)   [employer] of Occupational Health - Occupational Stress Questionnaire    Feeling of Stress : Only a little  Social Connections: Socially Integrated (08/09/2021)   Social Connection and Isolation Panel [NHANES]    Frequency of Communication with Friends and Family: More than three times a week    Frequency of Social Gatherings with Friends and Family: Never    Attends Religious Services: 1 to 4 times per year    Active Member of [employer] or Organizations: Yes    Attends  Banker Meetings: Never    Marital Status: Living with partner  Intimate Partner Violence: Not At Risk (02/13/2022)   Humiliation, Afraid, Rape, and Kick questionnaire    Fear of Current or Ex-Partner: No    Emotionally Abused: No    Physically Abused: No    Sexually Abused: No   No current facility-administered medications on file prior to encounter.   Current Outpatient Medications on File Prior to Encounter  Medication Sig Dispense Refill   Accu-Chek Softclix Lancets lancets Use as instructed QID 100 each 6   bictegravir-emtricitabine-tenofovir AF (BIKTARVY) 50-200-25 MG TABS tablet Take 1 tablet by mouth daily. 30 tablet 5   glucose blood (ACCU-CHEK GUIDE) test strip Use as instructed QID 100 each 6   metFORMIN (GLUCOPHAGE) 500 MG tablet Take 1 tablet (500 mg total) by mouth at bedtime. 60 tablet 2   Prenatal MV & Min w/FA-DHA (PRENATAL GUMMIES PO) Take by mouth.     valACYclovir (VALTREX) 500 MG tablet Take 1 tablet (500 mg total) by mouth 2 (two) times daily. 180 tablet 2   [DISCONTINUED] norelgestromin-ethinyl estradiol (ORTHO EVRA) 150-35 MCG/24HR transdermal patch Place 1 patch onto the skin once a week. 3 patch 12   No Known Allergies  ROS:  Pertinent positives/negatives listed above.  I have reviewed patient's Past Medical Hx, Surgical Hx, Family Hx, Social Hx, medications and allergies.   Physical Exam  Patient Vitals for the past 24 hrs:  BP Temp Temp src Pulse Resp SpO2 Height Weight  11/22/22 1300 115/76 -- -- (!) 108 -- 98 % -- --  11/22/22 1245 107/67 98.4 F (36.9 C) Oral (!) 110 18 98 % -- --  11/22/22 1237 -- -- -- -- -- -- 5' (1.524 m) 81.4 kg   Constitutional: Well-developed, well-nourished female in no acute distress.  Cardiovascular: normal rate Respiratory: normal effort GI: Abd soft, non-tender. Pos BS x 4 MS: Extremities nontender, no edema, normal ROM Neurologic: Alert and oriented x 4.   FHT:  Baseline 140 , moderate variability,  accelerations present, no decelerations Contractions: none, mild irritability  LAB RESULTS Results for orders placed or performed during the hospital encounter of 11/22/22 (from the past 24 hour(s))  Urinalysis, Routine w reflex microscopic -Urine, Clean Catch     Status: Abnormal   Collection Time: 11/22/22 12:37 PM  Result Value Ref Range   Color, Urine YELLOW YELLOW   APPearance CLEAR CLEAR   Specific Gravity, Urine 1.020 1.005 - 1.030   pH 7.0 5.0 - 8.0   Glucose, UA NEGATIVE NEGATIVE mg/dL   Hgb urine dipstick NEGATIVE NEGATIVE   Bilirubin Urine NEGATIVE NEGATIVE   Ketones, ur 15 (A) NEGATIVE mg/dL   Protein, ur NEGATIVE NEGATIVE mg/dL  Nitrite NEGATIVE NEGATIVE   Leukocytes,Ua TRACE (A) NEGATIVE  Urinalysis, Microscopic (reflex)     Status: Abnormal   Collection Time: 11/22/22 12:37 PM  Result Value Ref Range   RBC / HPF 0-5 0 - 5 RBC/hpf   WBC, UA 0-5 0 - 5 WBC/hpf   Bacteria, UA RARE (A) NONE SEEN   Squamous Epithelial / HPF 0-5 0 - 5 /HPF  Wet prep, genital     Status: Abnormal   Collection Time: 11/22/22  1:41 PM   Specimen: PATH Cytology Cervicovaginal Ancillary Only  Result Value Ref Range   Yeast Wet Prep HPF POC NONE SEEN NONE SEEN   Trich, Wet Prep NONE SEEN NONE SEEN   Clue Cells Wet Prep HPF POC NONE SEEN NONE SEEN   WBC, Wet Prep HPF POC >=10 (A) <10   Sperm NONE SEEN     AB/Positive/-- (04/15 1056)  IMAGING US FETAL BPP W/NONSTRESS  Result Date: 11/15/2022 ----------------------------------------------------------------------  OBSTETRICS REPORT                       (Signed Final 11/15/2022 01:24 pm) ---------------------------------------------------------------------- Patient Info  ID #:       [id number]                          D.O.B.:  May 17, 2000 (22 yrs)  Name:       Cena Benton             Visit Date: 11/15/2022 10:17 am ---------------------------------------------------------------------- Performed By  Attending:        Scheryl Darter MD         Ref. Address:     [address]                                                             [place], [place]                                                             [address]  Performed By:     Otilio Jefferson BA       [facility]  Referred By:      Juleen China              Visit Type:       OB                    HOGAN CNM ---------------------------------------------------------------------- Orders  #  Description                           Code        Ordered By  1  US FETAL BPP W/NONSTRESS              16109.6     Tinnie Gens ----------------------------------------------------------------------  #  Order #                     Accession #                Episode #  1  [id number]                   [id number]                 [id number] ---------------------------------------------------------------------- Indications  [redacted] weeks gestation of pregnancy                Z3A.30  HIV affecting pregnancy, first trimester       O98.711  Diabetes - Gestational                         O24.419 ---------------------------------------------------------------------- Fetal Evaluation  Num Of Fetuses:         1  Fetal Heart Rate(bpm):  153  Cardiac Activity:       Observed  Presentation:           Cephalic  Amniotic Fluid  AFI FV:      Within normal limits  AFI Sum(cm)     %Tile       Largest Pocket(cm)  15.44           55          5.65  RUQ(cm)       RLQ(cm)       LUQ(cm)        LLQ(cm)  5.09          5.65          2.84           1.86 ---------------------------------------------------------------------- Biophysical Evaluation  Amniotic F.V:   Within normal limits       F. Tone:        Observed  F. Movement:    Observed                   Score:           8/8  F. Breathing:   Observed ---------------------------------------------------------------------- OB History  Gravidity:    3         Term:   1        Prem:   0  SAB:   1  TOP:          0       Ectopic:  0        Living: 1 ---------------------------------------------------------------------- Gestational Age  LMP:           30w 1d        Date:  04/18/22                  EDD:   01/23/23  Best:          30w 1d     Det. By:  LMP  (04/18/22)          EDD:   01/23/23 ---------------------------------------------------------------------- Impression  30 weeks 1 day, BPP 9/8 ---------------------------------------------------------------------- Recommendations  Continue weekly antenatal testing till delivery Continue  weekly antenatal testing till delivery . ----------------------------------------------------------------------                 Scheryl Darter, MD Electronically Signed Final Report   11/15/2022 01:24 pm ----------------------------------------------------------------------   [place] MFM OB LIMITED  Result Date: 11/08/2022 ----------------------------------------------------------------------  OBSTETRICS REPORT                       (Signed Final 11/08/2022 11:32 am) ---------------------------------------------------------------------- Patient Info  ID #:       [id number]                          D.O.B.:  01-Jul-2000 (22 yrs)  Name:       Cena Benton             Visit Date: 11/08/2022 10:29 am ---------------------------------------------------------------------- Performed By  Attending:        Noralee Space MD        Ref. Address:     [address]                                                             [place], [place]                                                             [address]  Performed By:     Marcellina Millin       [facility]  Referred By:      Armando Reichert CNM ---------------------------------------------------------------------- Orders  #  Description                           Code        Ordered By  1  [place] MFM OB LIMITED                     [phone]    Braxton Feathers ----------------------------------------------------------------------  #  Order #                     Accession #                Episode #  1  [id number]                   [id number]                 [id number] ---------------------------------------------------------------------- Indications  HIV affecting pregnancy, third trimester       O98.713  Gestational diabetes in pregnancy, diet        O24.410  controlled  Obesity complicating pregnancy, third          O99.213  trimester PG 35.5  Herpes simplex virus (HSV)                     O98.519 B00.9  Short interval between pregancies, 3rd         O09.893  trimester  [redacted] weeks gestation of pregnancy                Z3A.29  LR NIPS/AFP negative ---------------------------------------------------------------------- Fetal Evaluation  Num Of Fetuses:         1  Fetal Heart Rate(bpm):  157  Cardiac Activity:       Observed  Presentation:           Breech  Placenta:               Posterior  P. Cord Insertion:      Previously visualized  Amniotic Fluid  AFI FV:      Within normal limits  AFI Sum(cm)     %Tile       Largest Pocket(cm)  18.89           73          7.12  RUQ(cm)       RLQ(cm)       LUQ(cm)        LLQ(cm)  3.24          4.23          4.3            7.12 ---------------------------------------------------------------------- Biometry  LV:        6.7  mm ---------------------------------------------------------------------- OB History  Gravidity:    3         Term:   1        Prem:   0        SAB:   1  TOP:          0       Ectopic:  0        Living: 1 ----------------------------------------------------------------------  Gestational Age  LMP:           29w  1d        Date:  04/18/22                  EDD:   01/23/23  Best:          29w 1d     Det. By:  LMP  (04/18/22)          EDD:   01/23/23 ---------------------------------------------------------------------- Anatomy  Ventricles:            Appears normal         Stomach:                Appears normal, left                                                                        sided  Thoracic:              Abnormal, see          Kidneys:                Appear normal                         comments  Heart:                 Pericardial            Bladder:                Appears normal                         effusion  Diaphragm:             Appears normal ---------------------------------------------------------------------- Impression  Pregnancy complicated by HIV infection. Recent viral RNA  levels were low (164 copies/mL). Patient takes anti-retroviral  medications.  Patient has gestational diabetes that is reportedly well-  controlled on diet.  Fetal pericardial effusion was seen at previous ultrasound.  On today's ultrasound, minimal pericardial effusion  measuring 3 mm is seen.  Four-chamber view, otherwise,  appears normal.  Mild echogenicity is seen in the inferior lobe  of the right lung (1.5x 1 cm).  He does not receive blood  supply from aorta.  No evidence of pleural effusion.  I explained the finding that this echogenicity could be normal  or could represent evolving congenital pulmonary airway  malformation (CPAM). I reassured the patient that pericardial  effusion has not increased.  We will evaluate at follow-up ultrasound. ----------------------------------------------------------------------                 Noralee Space, MD Electronically Signed Final Report   11/08/2022 11:32 am ----------------------------------------------------------------------   [place] MFM OB FOLLOW UP  Result Date: 11/01/2022 ----------------------------------------------------------------------   OBSTETRICS REPORT                       (Signed Final 11/01/2022 04:43 pm) ---------------------------------------------------------------------- Patient Info  ID #:       [id number]                          D.O.B.:  [birth year] (22 yrs)  Name:  Cena Benton             Visit Date: 11/01/2022 04:09 pm ---------------------------------------------------------------------- Performed By  Attending:        Braxton Feathers DO       Ref. Address:     [address]                                                             [place], [place]                                                             [address]  Performed By:     Dennis Bast RDMS      [facility]  Referred By:      Armando Reichert CNM ---------------------------------------------------------------------- Orders  #  Description                           Code        Ordered By  1  [place] MFM OB FOLLOW UP                   53664.40    Noralee Space ----------------------------------------------------------------------  #  Order #                     Accession #                Episode #  1  [id number]                   [id number]                 [id number] ---------------------------------------------------------------------- Indications  [redacted] weeks gestation of pregnancy                Z3A.28  HIV affecting pregnancy, third trimester       O98.713  Herpes simplex virus (HSV)                     O98.519 B00.9  Obesity complicating pregnancy, third          O99.213  trimester PG 35.5  Short interval between pregancies, 3rd         O09.893  trimester  Antenatal follow-up for nonvisualized fetal    Z36.2  anatomy  LR NIPS/AFP negative  A1c = 5.4 ----------------------------------------------------------------------  Vital Signs  BP:          110/68 ---------------------------------------------------------------------- Fetal Evaluation  Num Of Fetuses:         1  Fetal Heart Rate(bpm):  141  Cardiac Activity:       Observed  Presentation:           Cephalic  Placenta:               Posterior  P. Cord Insertion:      Visualized  Amniotic Fluid  AFI FV:      Within normal limits  AFI Sum(cm)     %Tile       Largest Pocket(cm)  15.08           53          4.9  RUQ(cm)       RLQ(cm)       LUQ(cm)        LLQ(cm)  3.71          4.9           4.34           2.13 ---------------------------------------------------------------------- Biometry  BPD:      72.1  mm     G. Age:  29w 0d         64  %    CI:        75.41   %    70 - 86                                                          FL/HC:      19.9   %    18.8 - 20.6  HC:      263.3  mm     G. Age:  28w 5d         34  %    HC/AC:      1.10        1.05 - 1.21  AC:      238.3  mm     G. Age:  28w 1d         42  %    FL/BPD:     72.7   %    71 - 87  FL:       52.4  mm     G. Age:  27w 6d         28  %    FL/AC:      22.0   %    20 - 24  LV:        4.2  mm  Est. FW:    1183  gm    2 lb 10 oz  37  % ---------------------------------------------------------------------- OB History  Gravidity:    3         Term:   1        Prem:   0        SAB:   1  TOP:          0       Ectopic:  0        Living: 1 ---------------------------------------------------------------------- Gestational Age  LMP:           28w 1d        Date:  04/18/22                  EDD:   01/23/23  U/S Today:     28w 3d                                        EDD:   01/21/23  Best:          28w 1d     Det. By:  LMP  (04/18/22)          EDD:   01/23/23 ---------------------------------------------------------------------- Anatomy  Heart:                 Pericardial            Kidneys:                Appear normal                         effusion, 3-5 mm  Stomach:               Appears normal, left   Bladder:                 Appears normal                         sided ---------------------------------------------------------------------- Comments  The patient is here for ultrasound at 28w 1d. EDD:  01/23/2023 dated by LMP  (04/18/22).  Her pregnancy is  complicated by HIV and HSV.  She is compliant with Biktarvy.  Her last HIV viral load was 2 months ago and increased from  3264.  She has no further concerns today.  Sonographic findings  Single intrauterine pregnancy.  Fetal cardiac activity:  Observed and appears normal.  Presentation: Cephalic.  Interval fetal anatomy appears normal except for a very small  pericardial effusion measuring 3-5 mm.  This is likely  physiologic.  There are no other signs of hydrops.  Fetal biometry shows the estimated fetal weight at the 37  percentile.  Amniotic fluid volume: Within normal limits. MVP: 4.9 cm.  Placenta: Posterior.  Recommendations  -Limited ultrasound next week to assess the fetal pericardium  and monitor for progression to hydrops fetalis.  I think this is  likely physiologic fluid but we will confirm next week.  -Serial growth ultrasounds until delivery  -Continue to follow-up with infectious disease  -Timing and route of delivery based on HIV viral load  -If the viral load is greater than 50 zidovudine is  recommended intrapartum.  If the viral load is greater than  1000 a cesarean delivery is recommended with zidovudine 3  hours prior to delivery ----------------------------------------------------------------------  Braxton Feathers, DO Electronically Signed Final Report   11/01/2022 04:43 pm ----------------------------------------------------------------------    MAU Management/MDM: Orders Placed This Encounter  Procedures   Wet prep, genital   Culture, OB Urine   Urinalysis, Routine w reflex microscopic -Urine, Clean Catch   Urinalysis, Microscopic (reflex)   Discharge patient    Meds ordered this encounter  Medications   acetaminophen (TYLENOL) tablet  1,000 mg   cyclobenzaprine (FLEXERIL) tablet 10 mg   cyclobenzaprine (FLEXERIL) 10 MG tablet    Sig: Take 1 tablet (10 mg total) by mouth 3 (three) times daily as needed for muscle spasms.    Dispense:  60 tablet    Refill:  0   acetaminophen (TYLENOL) 500 MG tablet    Sig: Take 2 tablets (1,000 mg total) by mouth every 8 (eight) hours as needed for moderate pain.    Dispense:  100 tablet    Refill:  2    Patient given tylenol and flexeril with improvement in symptoms. Wet mount negative. Urine with trace leuks. Low suspicions for current infection but will send for culture. No ctx noted while on monitor/in MAU. Offered patient cervical exam but overall felt low likelihood that any of the pain is indicative of preterm labor. She declined. GC/C pending. Provided education on round ligament pain, safe dosing of flexeril, and s/sx of preterm labor.  ASSESSMENT 1. Round ligament pain   2. Vaginal discharge during pregnancy in third trimester   3. [redacted] weeks gestation of pregnancy     PLAN Discharge home with strict return precautions. Allergies as of 11/22/2022   No Known Allergies      Medication List     TAKE these medications    Accu-Chek Guide test strip Generic drug: glucose blood Use as instructed QID   Accu-Chek Softclix Lancets lancets Use as instructed QID   acetaminophen 500 MG tablet Commonly known as: TYLENOL Take 2 tablets (1,000 mg total) by mouth every 8 (eight) hours as needed for moderate pain.   Biktarvy 50-200-25 MG Tabs tablet Generic drug: bictegravir-emtricitabine-tenofovir AF Take 1 tablet by mouth daily.   cyclobenzaprine 10 MG tablet Commonly known as: FLEXERIL Take 1 tablet (10 mg total) by mouth 3 (three) times daily as needed for muscle spasms. What changed: when to take this   metFORMIN 500 MG tablet Commonly known as: GLUCOPHAGE Take 1 tablet (500 mg total) by mouth at bedtime.   PRENATAL GUMMIES PO Take by mouth.   valACYclovir 500 MG  tablet Commonly known as: Valtrex Take 1 tablet (500 mg total) by mouth 2 (two) times daily.         Wylene Simmer, MD OB Fellow 11/22/2022  3:30 PM

## 2022-11-22 NOTE — MAU Note (Signed)
Alyssa Sandoval is a 22 y.o. at [redacted]w[redacted]d here in MAU reporting: she's having abdominal and buttock pain that began yesterday but worsened last night.  Reports pain in abdomen is intermittent and it's sharp/stabbing on the right side.  Reports took 2 muscle relaxers last night and was able to sleep, but pain remains today.  States buttock pain is intermittent, "feels like something is pushing out of my booty".   Denies VB or LOF.  Reports has pink discharge last night, resolved today. Endorses +FM. LMP: NA Onset of complaint: yesterday Pain score: 7 Vitals:   11/22/22 1245  BP: 107/67  Pulse: (!) 110  Resp: 18  Temp: 98.4 F (36.9 C)  SpO2: 98%     FHT:148 bpm Lab orders placed from triage:   UA

## 2022-11-23 LAB — GC/CHLAMYDIA PROBE AMP (~~LOC~~) NOT AT ARMC
Chlamydia: NEGATIVE
Comment: NEGATIVE
Comment: NORMAL
Neisseria Gonorrhea: NEGATIVE

## 2022-11-23 LAB — CULTURE, OB URINE: Culture: 60000 — AB

## 2022-11-24 ENCOUNTER — Encounter: Payer: Self-pay | Admitting: Obstetrics and Gynecology

## 2022-11-24 DIAGNOSIS — R8271 Bacteriuria: Secondary | ICD-10-CM | POA: Insufficient documentation

## 2022-11-25 ENCOUNTER — Encounter: Payer: Self-pay | Admitting: Advanced Practice Midwife

## 2022-11-25 ENCOUNTER — Other Ambulatory Visit: Payer: Self-pay | Admitting: Advanced Practice Midwife

## 2022-11-25 MED ORDER — CEFADROXIL 500 MG PO CAPS: 500.0000 mg | ORAL_CAPSULE | Freq: Two times a day (BID) | ORAL | 0 refills | Status: DC

## 2022-11-28 ENCOUNTER — Ambulatory Visit: Payer: MEDICAID | Attending: Advanced Practice Midwife

## 2022-11-28 DIAGNOSIS — M5459 Other low back pain: Secondary | ICD-10-CM | POA: Insufficient documentation

## 2022-11-28 DIAGNOSIS — R262 Difficulty in walking, not elsewhere classified: Secondary | ICD-10-CM | POA: Insufficient documentation

## 2022-11-28 DIAGNOSIS — M62830 Muscle spasm of back: Secondary | ICD-10-CM | POA: Diagnosis present

## 2022-11-28 DIAGNOSIS — M6281 Muscle weakness (generalized): Secondary | ICD-10-CM | POA: Insufficient documentation

## 2022-11-28 DIAGNOSIS — R252 Cramp and spasm: Secondary | ICD-10-CM | POA: Diagnosis present

## 2022-11-28 NOTE — Therapy (Addendum)
 OUTPATIENT PHYSICAL THERAPY THORACOLUMBAR TREATMENT PHYSICAL THERAPY DISCHARGE SUMMARY  Visits from Start of Care: 2  Current functional level related to goals / functional outcomes: See below   Remaining deficits: See below   Education / Equipment: See below   Patient agrees to discharge. Patient goals were not met. Patient is being discharged due to not returning since the last visit.    Patient Name: Alyssa Sandoval MRN: [id number] DOB:11-Feb-2001, 22 y.o., female Today's Date: 11/28/2022  END OF SESSION:  PT End of Session - 11/28/22 1542     Visit Number 2    Number of Visits 12    Date for PT Re-Evaluation 12/24/22    Authorization Type [employer]    Authorization Time Period 10/25/22 to 12/26/22    PT Start Time 1535    PT Stop Time 1615    PT Time Calculation (min) 40 min    Activity Tolerance Patient tolerated treatment well;No increased pain             Past Medical History:  Diagnosis Date   Angio-edema    Chronic constipation 06/10/2012   COVID-19 04/2020   Depression    Dysfunctional uterine bleeding    GERD (gastroesophageal reflux disease) 06/10/2012   Headache 12/09/2020   Headache 12/09/2020   High-risk pregnancy 07/13/2021   HIV (human immunodeficiency virus infection) (HCC)    HIV (human immunodeficiency virus infection) (HCC)    HIV disease (HCC) 11/01/2020   -on Biktarvy , last viral load: 07/13/21: 12,000  12/19/21: not detected   EFW @ 28, 36wks     No testing        C/S @ 38wks (VL >1K) or IOL @ 39wks (VL <1K)   HSV (herpes simplex virus) anogenital infection    Low back pain 12/09/2020   Low back pain 12/09/2020   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   Passive suicidal ideations 11/07/2020   Passive suicidal ideations 11/07/2020   Polyhydramnios 02/13/2022   Postpartum hemorrhage 02/16/2022   Pregnancy induced hypertension    Pyelonephritis 08/19/2021   Admission 5/13-5/15   Urine cx poc 09/11/21 neg   Urticaria    Vaginal  delivery 02/16/2022   Vaginal pruritus 03/15/2021   Vaginal yeast infection    Past Surgical History:  Procedure Laterality Date   NO PAST SURGERIES     Patient Active Problem List   Diagnosis Date Noted   GBS bacteriuria 11/24/2022   History of postpartum hemorrhage 10/31/2022   Gestational diabetes 10/19/2022   HIV (human immunodeficiency virus) risk factors complicating pregnancy 10/17/2022   Supervision of high risk pregnancy, antepartum 07/17/2022   Short interval between pregnancies affecting pregnancy, antepartum 07/17/2022   HIV positive (HCC) 01/29/2022   HSV (herpes simplex virus) anogenital infection 08/09/2021    PCP: none   REFERRING PROVIDER: Virginia  Claudene, CNM  REFERRING DIAG:  O26.892,M25.559 (ICD-10-CM) - Pregnancy related hip pain in second trimester, antepartum  O26.892,M79.606 (ICD-10-CM) - Pregnancy related leg pain in second trimester, antepartum    Rationale for Evaluation and Treatment: Rehabilitation  THERAPY DIAG:  Other low back pain  Muscle weakness (generalized)  Muscle spasm of back  Difficulty in walking, not elsewhere classified  Cramp and spasm  ONSET DATE: beginning of pregnancy approximately 6 months ago  SUBJECTIVE:  SUBJECTIVE STATEMENT: Patient reports its about the same.  Spouse/boyfriend assists patient to walk into clinic.  Patient ambulates extremely slow/antalgic/guarded. She reports her pain at 6/10.   PERTINENT HISTORY:  Vaginal delivery 02/16/22 Currently [redacted] weeks pregnant  PAIN:  Are you having pain? Yes: NPRS scale: 0/10      max 10/10 can cause her to cry Pain location: low back Pain description: sharp shooting pain Aggravating factors: moving around, transitions, walking Relieving factors: hot epsom salt soak  PRECAUTIONS:  Other: pregnant   RED FLAGS: None   WEIGHT BEARING RESTRICTIONS: No  FALLS:  Has patient fallen in last 6 months? No  LIVING ENVIRONMENT: Lives with: lives with their family Lives in: House/apartment Stairs:  Has following equipment at home:   OCCUPATION: Stay at home mom  PLOF: Independent  PATIENT GOALS: better manage her pain  NEXT MD VISIT: regular OB visits   OBJECTIVE:   DIAGNOSTIC FINDINGS:    PATIENT SURVEYS:  ODI: 19/50 Moderate disability  SCREENING FOR RED FLAGS: Bowel or bladder incontinence: No Spinal tumors: No Cauda equina syndrome: No Compression fracture: No Abdominal aneurysm: No  COGNITION: Overall cognitive status: Within functional limits for tasks assessed     SENSATION: Not tested  MUSCLE LENGTH: Hamstrings: Right WNL deg; Left WNL deg Debby test: Right (+) hip flexor deg; Left (+) hip flexor deg  POSTURE: rounded shoulders, forward head, increased lumbar lordosis, increased thoracic kyphosis, and anterior pelvic tilt  PALPATION: Tenderness bilateral lumbar paraspinals L5 region  LUMBAR ROM:   AROM eval  Flexion Limited due to baby belly (+) pain low back  Extension   Right lateral flexion   Left lateral flexion   Right rotation WNL  Left rotation WNL   (Blank rows = not tested)  LOWER EXTREMITY ROM:     Passive  Right eval Left eval  Hip flexion WNL WNL  Hip extension    Hip abduction    Hip adduction    Hip internal rotation WNL WNL  Hip external rotation WNL WNL  Knee flexion    Knee extension    Ankle dorsiflexion    Ankle plantarflexion    Ankle inversion    Ankle eversion     (Blank rows = not tested)  LOWER EXTREMITY MMT:    MMT Right eval Left eval  Hip flexion 3 3  Hip extension 3 3  Hip abduction 3 3  Hip adduction    Hip internal rotation    Hip external rotation    Knee flexion    Knee extension    Ankle dorsiflexion    Ankle plantarflexion    Ankle inversion    Ankle eversion      (Blank rows = not tested)  LUMBAR SPECIAL TESTS:  Straight leg raise test: Positive and Trendelenburg sign: Positive  FUNCTIONAL TESTS:  Bed mobility Modified independent, (+) coning noted with poor pressure management of abdominals  GAIT: Distance walked:  Assistive device utilized:  Level of assistance:  Comments: decreased hip extension bilateral  TODAY'S TREATMENT:  DATE: 11/28/22 Nustep x 7 min level 3 Seated figure 4 stretch x 5 each LE x 10 sec (patient breathing hard, states she was getting a headache- verbal cues to avoid holding her breath) Standing hip flexor stretch 3 x 30 sec each LE (foot on table holding onto chair) Supine trunk rotation x 20 Reviewed HEP: cat/cow x 10, patient gets discomfort with tail wags Added childs pose with increasing hip abduction with each rep Added hooklying clam with blue loop 2 x 10  DATE:  No charge for treatment per insurance restrictions  Seated pelvic tilts and rolls for pain management Cat/cow HEP demo Quad tail wags HEP demo Log roll technique to avoid sit up position and excess strain on rectus abdominus   PATIENT EDUCATION:  Education details: eval findings/POC; HEP implemented; encouraged wearing compression socks, log roll, walking within pain free parameters Person educated: Patient Education method: Explanation, Verbal cues, and Handouts Education comprehension: verbalized understanding and returned demonstration  HOME EXERCISE PROGRAM: Access Code: [email or web address] URL: [email or web address] Date: 10/25/2022 Prepared by: [facility]  Exercises - Cat Cow  - 3 x daily - 7 x weekly - 1 sets - 10 reps - Tail Wag  - 3 x daily - 7 x weekly - 1 sets - 10 reps  Patient Education - Log Roll  ASSESSMENT:  CLINICAL IMPRESSION: Alyssa Sandoval is moving  quite slowly and is very guarded but completed all tasks today and was able to walk independently after bike and stretching.  She admits she tried to do some of the exercises but had pain with several of them and did not do any of those.  She was able to tolerate addition of childs pose.  We held on tail wags as this was painful for her.  She is now 32 weeks.  At end of session, patient called for partner to assist her out.   She would benefit from continuing skilled PT to address her ROM, strength and body mechanics restrictions and improve her quality of life and her ability to care for her infant son through the remainder of her pregnancy.   OBJECTIVE IMPAIRMENTS: decreased activity tolerance, decreased knowledge of condition, decreased mobility, difficulty walking, decreased strength, increased muscle spasms, impaired flexibility, improper body mechanics, postural dysfunction, and pain.   ACTIVITY LIMITATIONS: carrying, lifting, standing, transfers, bed mobility, locomotion level, and caring for others  PARTICIPATION LIMITATIONS: meal prep, cleaning, shopping, community activity, occupation, and yard work  PERSONAL FACTORS: Age, Fitness, Past/current experiences, Time since onset of injury/illness/exacerbation, and 1 comorbidity: previous pregnancy and time between pregnancies are also affecting patient's functional outcome.   REHAB POTENTIAL: Good  CLINICAL DECISION MAKING: Evolving/moderate complexity  EVALUATION COMPLEXITY: Moderate   GOALS: Goals reviewed with patient? Yes  SHORT TERM GOALS: Target date: 11/06/22  Pt will be independent with initial HEP to increase flexibility and strength. Baseline: Goal status: INITIAL  Goal status: INITIAL    LONG TERM GOALS: Target date: 12/26/22  Pt will demo proper body mechanics with bed mobility and lifting, requiring minimal PT cues, to decrease strain on the low back. Baseline:  Goal status: INITIAL  2.  Pt will score less than 14  points on the ODI to reflect a decrease in overall disability and improve quality of life.  Baseline:  Goal status: INITIAL  3.  Pt will have increase in BLE strength to atleast 4/5 MMT which will improve her ability to ambulate and complete basic ADLs with less difficulty.  Baseline:  Goal status: INITIAL  4.  Pt will be able to properly activate deep abdominals without the need for PT cuing, which will help her in the remainder of her pregnancy, during delivery, as well as recovery post partum. Baseline:  Goal status: INITIAL  5.  Pt will report atleast 25% reduction in sharp low back/hip pain from the start of PT to allow her to perform regular walking/light exercise for the remainder of her pregnancy.  Baseline:  Goal status: INITIAL    PLAN:  PT FREQUENCY: 1-2x/week  PT DURATION: 6 weeks  PLANNED INTERVENTIONS: Therapeutic exercises, Therapeutic activity, Neuromuscular re-education, Patient/Family education, Self Care, Joint mobilization, Aquatic Therapy, Dry Needling, Cryotherapy, Moist heat, Manual therapy, and Re-evaluation.  PLAN FOR NEXT SESSION: Nustep, quad and hip flexor stretching, progress core strength, body mechanics for lifting.  Delon B. Sergio Hobart, PT 11/28/22 5:13 PM [facility] [address], [address] [place], [place] [address] Phone # [phone] Fax [phone]

## 2022-11-29 ENCOUNTER — Ambulatory Visit: Payer: MEDICAID | Attending: Maternal & Fetal Medicine

## 2022-11-29 ENCOUNTER — Other Ambulatory Visit: Payer: Self-pay | Admitting: Maternal & Fetal Medicine

## 2022-11-29 DIAGNOSIS — O98713 Human immunodeficiency virus [HIV] disease complicating pregnancy, third trimester: Secondary | ICD-10-CM

## 2022-11-29 DIAGNOSIS — O99213 Obesity complicating pregnancy, third trimester: Secondary | ICD-10-CM | POA: Diagnosis present

## 2022-11-29 DIAGNOSIS — Z8619 Personal history of other infectious and parasitic diseases: Secondary | ICD-10-CM | POA: Diagnosis present

## 2022-11-29 DIAGNOSIS — O24415 Gestational diabetes mellitus in pregnancy, controlled by oral hypoglycemic drugs: Secondary | ICD-10-CM | POA: Diagnosis not present

## 2022-11-29 DIAGNOSIS — Z3A32 32 weeks gestation of pregnancy: Secondary | ICD-10-CM

## 2022-11-29 DIAGNOSIS — O24419 Gestational diabetes mellitus in pregnancy, unspecified control: Secondary | ICD-10-CM | POA: Diagnosis present

## 2022-11-29 DIAGNOSIS — B009 Herpesviral infection, unspecified: Secondary | ICD-10-CM

## 2022-11-29 DIAGNOSIS — O98813 Other maternal infectious and parasitic diseases complicating pregnancy, third trimester: Secondary | ICD-10-CM | POA: Diagnosis not present

## 2022-11-29 DIAGNOSIS — E669 Obesity, unspecified: Secondary | ICD-10-CM

## 2022-11-29 DIAGNOSIS — I3139 Other pericardial effusion (noninflammatory): Secondary | ICD-10-CM | POA: Diagnosis present

## 2022-11-29 DIAGNOSIS — B20 Human immunodeficiency virus [HIV] disease: Secondary | ICD-10-CM | POA: Diagnosis not present

## 2022-11-30 ENCOUNTER — Other Ambulatory Visit: Payer: Self-pay | Admitting: *Deleted

## 2022-11-30 DIAGNOSIS — O24415 Gestational diabetes mellitus in pregnancy, controlled by oral hypoglycemic drugs: Secondary | ICD-10-CM

## 2022-12-01 ENCOUNTER — Other Ambulatory Visit: Payer: Self-pay | Admitting: Advanced Practice Midwife

## 2022-12-01 MED ORDER — TERCONAZOLE 0.4 % VA CREA: 1.0000 | TOPICAL_CREAM | Freq: Every day | VAGINAL | 0 refills | Status: DC

## 2022-12-04 ENCOUNTER — Ambulatory Visit (INDEPENDENT_AMBULATORY_CARE_PROVIDER_SITE_OTHER): Payer: MEDICAID | Admitting: Obstetrics & Gynecology

## 2022-12-04 ENCOUNTER — Other Ambulatory Visit: Payer: Self-pay

## 2022-12-04 VITALS — BP 110/74 | HR 104 | Wt 178.0 lb

## 2022-12-04 DIAGNOSIS — O09893 Supervision of other high risk pregnancies, third trimester: Secondary | ICD-10-CM

## 2022-12-04 DIAGNOSIS — Z3A32 32 weeks gestation of pregnancy: Secondary | ICD-10-CM

## 2022-12-04 DIAGNOSIS — O0990 Supervision of high risk pregnancy, unspecified, unspecified trimester: Secondary | ICD-10-CM

## 2022-12-04 DIAGNOSIS — R8271 Bacteriuria: Secondary | ICD-10-CM

## 2022-12-04 DIAGNOSIS — O24419 Gestational diabetes mellitus in pregnancy, unspecified control: Secondary | ICD-10-CM

## 2022-12-04 NOTE — Progress Notes (Signed)
   PRENATAL VISIT NOTE  Subjective:  Alyssa Sandoval is a 22 y.o. G3P1011 at [redacted]w[redacted]d being seen today for ongoing prenatal care.  She is currently monitored for the following issues for this high-risk pregnancy and has HSV (herpes simplex virus) anogenital infection; HIV positive (HCC); Supervision of high risk pregnancy, antepartum; Short interval between pregnancies affecting pregnancy, antepartum; HIV (human immunodeficiency virus) risk factors complicating pregnancy; Gestational diabetes; History of postpartum hemorrhage; and GBS bacteriuria on their problem list.  Patient reports backache.  Contractions: Irritability. Vag. Bleeding: None.  Movement: Present. Denies leaking of fluid.   The following portions of the patient's history were reviewed and updated as appropriate: allergies, current medications, past family history, past medical history, past social history, past surgical history and problem list.   Objective:   Vitals:   12/04/22 1519  BP: 110/74  Pulse: (!) 104  Weight: 178 lb (80.7 kg)    Fetal Status: Fetal Heart Rate (bpm): 158   Movement: Present     General:  Alert, oriented and cooperative. Patient is in no acute distress.  Skin: Skin is warm and dry. No rash noted.   Cardiovascular: Normal heart rate noted  Respiratory: Normal respiratory effort, no problems with respiration noted  Abdomen: Soft, gravid, appropriate for gestational age.  Pain/Pressure: Present     Pelvic: Cervical exam deferred        Extremities: Normal range of motion.  Edema: Trace  Mental Status: Normal mood and affect. Normal behavior. Normal judgment and thought content.   Assessment and Plan:  Pregnancy: G3P1011 at [redacted]w[redacted]d 1. Gestational diabetes mellitus (GDM) in third trimester, gestational diabetes method of control unspecified FBS 90's, PP 120-130 by verbal report  2. Supervision of high risk pregnancy, antepartum F/u ID on 9/6  3. GBS bacteriuria   4. HIV risk factors  affecting pregnancy in third trimester ID f/u  Preterm labor symptoms and general obstetric precautions including but not limited to vaginal bleeding, contractions, leaking of fluid and fetal movement were reviewed in detail with the patient. Please refer to After Visit Summary for other counseling recommendations.   Return in about 2 weeks (around 12/18/2022).  Future Appointments  Date Time Provider Department Center  12/05/2022  2:45 PM Vernell Barrier, [place] [facility] None  12/06/2022  1:00 PM [facility] NURSE [facility]  12/06/2022  1:15 PM [facility] NST [facility]  12/12/2022  2:45 PM Mikey Kirschner B, PT [facility] None  12/13/2022  1:00 PM [facility] NURSE [facility]  12/13/2022  1:15 PM [facility] NST [facility]  12/14/2022 11:00 AM Veryl Speak, FNP [facility]  12/19/2022  2:45 PM Vernell Barrier, PT [facility] None  12/20/2022  1:00 PM [facility] NURSE [facility]  12/20/2022  1:15 PM [facility] NST [facility]  12/27/2022  1:15 PM [facility] NURSE [facility]  12/27/2022  1:30 PM [facility] US2 [facility]  01/03/2023  1:00 PM [facility] NURSE [facility]  01/03/2023  1:15 PM [facility] NST [facility]  05/03/2023  3:30 PM Alfonse Spruce, MD [facility] None    Scheryl Darter, MD

## 2022-12-05 ENCOUNTER — Ambulatory Visit: Payer: MEDICAID

## 2022-12-06 ENCOUNTER — Ambulatory Visit: Payer: MEDICAID

## 2022-12-06 ENCOUNTER — Ambulatory Visit: Payer: MEDICAID | Admitting: *Deleted

## 2022-12-06 ENCOUNTER — Ambulatory Visit: Payer: MEDICAID | Attending: Obstetrics and Gynecology | Admitting: *Deleted

## 2022-12-06 VITALS — BP 102/63 | HR 119

## 2022-12-06 DIAGNOSIS — O0990 Supervision of high risk pregnancy, unspecified, unspecified trimester: Secondary | ICD-10-CM

## 2022-12-06 DIAGNOSIS — O09899 Supervision of other high risk pregnancies, unspecified trimester: Secondary | ICD-10-CM

## 2022-12-06 DIAGNOSIS — O24415 Gestational diabetes mellitus in pregnancy, controlled by oral hypoglycemic drugs: Secondary | ICD-10-CM | POA: Insufficient documentation

## 2022-12-06 DIAGNOSIS — Z3A33 33 weeks gestation of pregnancy: Secondary | ICD-10-CM | POA: Insufficient documentation

## 2022-12-06 DIAGNOSIS — R8271 Bacteriuria: Secondary | ICD-10-CM

## 2022-12-06 NOTE — Procedures (Signed)
Alyssa Sandoval [birth year] [redacted]w[redacted]d  Fetus A Non-Stress Test Interpretation for 12/06/22   NST only  Indication: Gestational Diabetes medication controlled  Fetal Heart Rate A Mode: External Baseline Rate (A): 140 bpm Variability: Moderate Accelerations: 15 x 15 Decelerations: None Multiple birth?: No  Uterine Activity Mode: Palpation, Toco Contraction Frequency (min): none Resting Tone Palpated: Relaxed  Interpretation (Fetal Testing) Nonstress Test Interpretation: Reactive Overall Impression: Reassuring for gestational age Comments: Dr. Parke Poisson reviewed tracing.

## 2022-12-12 ENCOUNTER — Ambulatory Visit: Payer: MEDICAID | Attending: Advanced Practice Midwife

## 2022-12-12 DIAGNOSIS — M62830 Muscle spasm of back: Secondary | ICD-10-CM | POA: Insufficient documentation

## 2022-12-12 DIAGNOSIS — M6281 Muscle weakness (generalized): Secondary | ICD-10-CM | POA: Insufficient documentation

## 2022-12-12 DIAGNOSIS — R262 Difficulty in walking, not elsewhere classified: Secondary | ICD-10-CM | POA: Insufficient documentation

## 2022-12-12 DIAGNOSIS — M5459 Other low back pain: Secondary | ICD-10-CM | POA: Insufficient documentation

## 2022-12-12 DIAGNOSIS — R252 Cramp and spasm: Secondary | ICD-10-CM | POA: Insufficient documentation

## 2022-12-13 ENCOUNTER — Ambulatory Visit: Payer: MEDICAID | Admitting: *Deleted

## 2022-12-13 ENCOUNTER — Ambulatory Visit: Payer: MEDICAID | Attending: Obstetrics | Admitting: *Deleted

## 2022-12-13 VITALS — BP 108/63 | HR 102

## 2022-12-13 DIAGNOSIS — O24415 Gestational diabetes mellitus in pregnancy, controlled by oral hypoglycemic drugs: Secondary | ICD-10-CM | POA: Diagnosis present

## 2022-12-13 DIAGNOSIS — Z3A34 34 weeks gestation of pregnancy: Secondary | ICD-10-CM | POA: Diagnosis not present

## 2022-12-13 DIAGNOSIS — O09899 Supervision of other high risk pregnancies, unspecified trimester: Secondary | ICD-10-CM

## 2022-12-13 DIAGNOSIS — R8271 Bacteriuria: Secondary | ICD-10-CM

## 2022-12-13 DIAGNOSIS — O0990 Supervision of high risk pregnancy, unspecified, unspecified trimester: Secondary | ICD-10-CM

## 2022-12-13 NOTE — Procedures (Signed)
KIAJA DENVER 03/17/2001 [redacted]w[redacted]d  Fetus A Non-Stress Test Interpretation for 12/13/22  Indication: Gestational Diabetes medication controlled  Fetal Heart Rate A Mode: External Baseline Rate (A): 140 bpm Variability: Moderate Accelerations: 15 x 15 Decelerations: None Multiple birth?: No  Uterine Activity Mode: Palpation, Toco Contraction Frequency (min): none Resting Tone Palpated: Relaxed  Interpretation (Fetal Testing) Nonstress Test Interpretation: Reactive Overall Impression: Reassuring for gestational age Comments: Dr. Judeth Cornfield reviewed tracing

## 2022-12-14 ENCOUNTER — Inpatient Hospital Stay (HOSPITAL_COMMUNITY)
Admission: AD | Admit: 2022-12-14 | Discharge: 2022-12-14 | Disposition: A | Discharge status: Home / Self Care | Payer: MEDICAID | Attending: Obstetrics & Gynecology | Admitting: Obstetrics & Gynecology

## 2022-12-14 ENCOUNTER — Other Ambulatory Visit: Payer: Self-pay | Admitting: Certified Nurse Midwife

## 2022-12-14 ENCOUNTER — Other Ambulatory Visit: Payer: Self-pay | Admitting: Family

## 2022-12-14 ENCOUNTER — Encounter (HOSPITAL_COMMUNITY): Payer: Self-pay | Admitting: Obstetrics & Gynecology

## 2022-12-14 ENCOUNTER — Other Ambulatory Visit: Payer: Self-pay

## 2022-12-14 ENCOUNTER — Encounter: Payer: Self-pay | Admitting: Family

## 2022-12-14 ENCOUNTER — Inpatient Hospital Stay (HOSPITAL_COMMUNITY): Payer: MEDICAID

## 2022-12-14 ENCOUNTER — Ambulatory Visit (INDEPENDENT_AMBULATORY_CARE_PROVIDER_SITE_OTHER): Payer: MEDICAID | Admitting: Family

## 2022-12-14 VITALS — BP 101/72 | HR 72 | Resp 16 | Ht 60.0 in | Wt 182.0 lb

## 2022-12-14 DIAGNOSIS — O368130 Decreased fetal movements, third trimester, not applicable or unspecified: Secondary | ICD-10-CM

## 2022-12-14 DIAGNOSIS — Z3A34 34 weeks gestation of pregnancy: Secondary | ICD-10-CM

## 2022-12-14 DIAGNOSIS — O9A213 Injury, poisoning and certain other consequences of external causes complicating pregnancy, third trimester: Secondary | ICD-10-CM | POA: Diagnosis not present

## 2022-12-14 DIAGNOSIS — O09893 Supervision of other high risk pregnancies, third trimester: Secondary | ICD-10-CM | POA: Diagnosis not present

## 2022-12-14 DIAGNOSIS — B20 Human immunodeficiency virus [HIV] disease: Secondary | ICD-10-CM | POA: Diagnosis not present

## 2022-12-14 DIAGNOSIS — O98513 Other viral diseases complicating pregnancy, third trimester: Secondary | ICD-10-CM | POA: Insufficient documentation

## 2022-12-14 DIAGNOSIS — W51XXXA Accidental striking against or bumped into by another person, initial encounter: Secondary | ICD-10-CM | POA: Diagnosis not present

## 2022-12-14 DIAGNOSIS — Z21 Asymptomatic human immunodeficiency virus [HIV] infection status: Secondary | ICD-10-CM

## 2022-12-14 DIAGNOSIS — O24415 Gestational diabetes mellitus in pregnancy, controlled by oral hypoglycemic drugs: Secondary | ICD-10-CM

## 2022-12-14 DIAGNOSIS — O98713 Human immunodeficiency virus [HIV] disease complicating pregnancy, third trimester: Secondary | ICD-10-CM

## 2022-12-14 DIAGNOSIS — O0990 Supervision of high risk pregnancy, unspecified, unspecified trimester: Secondary | ICD-10-CM

## 2022-12-14 DIAGNOSIS — O99213 Obesity complicating pregnancy, third trimester: Secondary | ICD-10-CM | POA: Insufficient documentation

## 2022-12-14 DIAGNOSIS — S3991XA Unspecified injury of abdomen, initial encounter: Secondary | ICD-10-CM | POA: Diagnosis not present

## 2022-12-14 DIAGNOSIS — M545 Low back pain, unspecified: Secondary | ICD-10-CM | POA: Diagnosis not present

## 2022-12-14 DIAGNOSIS — O09899 Supervision of other high risk pregnancies, unspecified trimester: Secondary | ICD-10-CM

## 2022-12-14 DIAGNOSIS — O99013 Anemia complicating pregnancy, third trimester: Secondary | ICD-10-CM | POA: Diagnosis not present

## 2022-12-14 DIAGNOSIS — E669 Obesity, unspecified: Secondary | ICD-10-CM

## 2022-12-14 DIAGNOSIS — R8271 Bacteriuria: Secondary | ICD-10-CM

## 2022-12-14 DIAGNOSIS — O2343 Unspecified infection of urinary tract in pregnancy, third trimester: Secondary | ICD-10-CM

## 2022-12-14 DIAGNOSIS — O26893 Other specified pregnancy related conditions, third trimester: Secondary | ICD-10-CM | POA: Diagnosis not present

## 2022-12-14 DIAGNOSIS — O98813 Other maternal infectious and parasitic diseases complicating pregnancy, third trimester: Secondary | ICD-10-CM

## 2022-12-14 DIAGNOSIS — B009 Herpesviral infection, unspecified: Secondary | ICD-10-CM | POA: Diagnosis not present

## 2022-12-14 LAB — COMPREHENSIVE METABOLIC PANEL
ALT: 17 U/L (ref 0–44)
AST: 17 U/L (ref 15–41)
Albumin: 2.8 g/dL — ABNORMAL LOW (ref 3.5–5.0)
Alkaline Phosphatase: 100 U/L (ref 38–126)
Anion gap: 7 (ref 5–15)
BUN: 6 mg/dL (ref 6–20)
CO2: 23 mmol/L (ref 22–32)
Calcium: 8.9 mg/dL (ref 8.9–10.3)
Chloride: 104 mmol/L (ref 98–111)
Creatinine, Ser: 0.41 mg/dL — ABNORMAL LOW (ref 0.44–1.00)
GFR, Estimated: >60 mL/min (ref >60)
Glucose, Bld: 91 mg/dL (ref 70–99)
Potassium: 3.6 mmol/L (ref 3.5–5.1)
Sodium: 134 mmol/L — ABNORMAL LOW (ref 135–145)
Total Bilirubin: 0.5 mg/dL (ref 0.3–1.2)
Total Protein: 6.2 g/dL — ABNORMAL LOW (ref 6.5–8.1)

## 2022-12-14 LAB — CBC
HCT: 30.8 % — ABNORMAL LOW (ref 36.0–46.0)
Hemoglobin: 9.7 g/dL — ABNORMAL LOW (ref 12.0–15.0)
MCH: 24.7 pg — ABNORMAL LOW (ref 26.0–34.0)
MCHC: 31.5 g/dL (ref 30.0–36.0)
MCV: 78.6 fL — ABNORMAL LOW (ref 80.0–100.0)
Platelets: 301 10*3/uL (ref 150–400)
RBC: 3.92 MIL/uL (ref 3.87–5.11)
RDW: 14.7 % (ref 11.5–15.5)
WBC: 10.4 10*3/uL (ref 4.0–10.5)
nRBC: 0 % (ref 0.0–0.2)

## 2022-12-14 LAB — URINALYSIS, ROUTINE W REFLEX MICROSCOPIC
Bilirubin Urine: NEGATIVE
Glucose, UA: NEGATIVE mg/dL
Hgb urine dipstick: NEGATIVE
Ketones, ur: NEGATIVE mg/dL
Nitrite: NEGATIVE
Protein, ur: NEGATIVE mg/dL
Specific Gravity, Urine: 1.006 (ref 1.005–1.030)
pH: 7 (ref 5.0–8.0)

## 2022-12-14 LAB — TYPE AND SCREEN
ABO/RH(D): AB POS
Antibody Screen: NEGATIVE

## 2022-12-14 MED ORDER — FERROUS SULFATE 325 (65 FE) MG PO TABS: 325.0000 mg | ORAL_TABLET | ORAL | 3 refills | Status: AC

## 2022-12-14 MED ORDER — BIKTARVY 50-200-25 MG PO TABS
1.0000 | ORAL_TABLET | Freq: Every day | ORAL | 5 refills | Status: DC
Start: 2022-12-14 — End: 2023-07-31

## 2022-12-14 MED ORDER — CYCLOBENZAPRINE HCL 5 MG PO TABS
10.0000 mg | ORAL_TABLET | Freq: Once | ORAL | Status: AC
  Administered 2022-12-14: 10 mg via ORAL
  Filled 2022-12-14: qty 2

## 2022-12-14 MED ORDER — ACETAMINOPHEN-CAFFEINE 500-65 MG PO TABS
2.0000 | ORAL_TABLET | Freq: Once | ORAL | Status: AC
  Administered 2022-12-14: 2 via ORAL
  Filled 2022-12-14: qty 2

## 2022-12-14 MED ORDER — CYCLOBENZAPRINE HCL 10 MG PO TABS: 10.0000 mg | ORAL_TABLET | Freq: Three times a day (TID) | ORAL | 0 refills | Status: AC | PRN

## 2022-12-14 MED ORDER — LACTATED RINGERS IV BOLUS
1000.0000 mL | Freq: Once | INTRAVENOUS | Status: AC
  Administered 2022-12-14: 1000 mL via INTRAVENOUS

## 2022-12-14 MED ORDER — FERROUS SULFATE 325 (65 FE) MG PO TABS
325.0000 mg | ORAL_TABLET | ORAL | Status: DC
  Filled 2022-12-14: qty 1

## 2022-12-14 MED ORDER — SODIUM CHLORIDE 0.9 % IV SOLN
8.0000 mg | Freq: Once | INTRAVENOUS | Status: AC
  Administered 2022-12-14: 8 mg via INTRAVENOUS
  Filled 2022-12-14: qty 4

## 2022-12-14 NOTE — Patient Instructions (Addendum)
Nice to see you.  We will check your lab work today.  Continue to take your medication daily as prescribed.  Refills have been sent to the pharmacy.  Plan for follow up in 4 months or sooner if needed.  Have a great day and stay safe!

## 2022-12-14 NOTE — MAU Provider Note (Signed)
Fall now [facility]    S Alyssa Sandoval is a 22 y.o. G61P1011  female at [redacted]w[redacted]d who presents to MAU today with complaint of DFM after fall 9/5.  Pt states she still feels movement just subjectively less so than normal.  Pt reports her 30lb child was standing on her husband laying next to her and child fell back onto mother's stomach onto her L side of her abdomen about 1300 9/5 and immediately started feeling decreased fetal movement and had vaginal spotting.  VB resolved later that night but has since had low/mid back and abdominal pain.  Took tylenol and muscle relaxer last night without much relief. Went to her ID clinic appointment this morning and immediately became nauseated and starting vomiting after meal around 1100 9/6. States went home to lay down but continued to have vomitus, bilious so decided to come in for evaluation.  Denies LOF, CTX, F/C.    Receives care at [facility]. Prenatal records reviewed.  Pertinent items noted in HPI and remainder of comprehensive ROS otherwise negative.   O BP 109/69   Pulse 99   Temp 98 F (36.7 C) (Oral)   Resp 18   Ht 5' (1.524 m)   Wt 82.2 kg   LMP 04/18/2022 Comment: a month ago.  + home pregnancy test on 3/24  SpO2 95%   BMI 35.39 kg/m  Physical Exam Vitals and nursing note reviewed.  Constitutional:      General: She is not in acute distress.    Appearance: She is well-developed. She is ill-appearing.  HENT:     Head: Normocephalic and atraumatic.  Eyes:     Extraocular Movements: Extraocular movements intact.  Cardiovascular:     Rate and Rhythm: Regular rhythm. Tachycardia present.     Heart sounds: No murmur heard.    No friction rub. No gallop.  Pulmonary:     Effort: Pulmonary effort is normal. No respiratory distress.     Breath sounds: Normal breath sounds.  Abdominal:     General: There are no signs of injury.     Palpations: Abdomen is soft.     Tenderness: There is abdominal tenderness.     Comments: Fundal tenderness   Skin:    General: Skin is warm and dry.  Neurological:     General: No focal deficit present.     Mental Status: She is alert and oriented to person, place, and time.     Motor: No weakness.  Psychiatric:        Mood and Affect: Mood normal. Mood is not anxious or depressed.        Behavior: Behavior normal.     Media Information      Document Information  Ultrasound    12/14/2022 00:00  Attached To:  Hospital Encounter on 12/14/22  Source Information  Default, Provider, MD  Document History      MDM: MAU Course:  NST: 140bpm, moderate variability, +accels, moderate variable decels, CTX none, Cat 2  Limited OB US = no abruption or previa on prelim (see above)  BPP 6/8, no sustained breathing for but not expected for 34wks  Patient started to endorse HA and back pain, gave Excedrin Tension 2 tablets with relief of HA symptoms. Back pain still present.  Gave 10mg  Oral Flexeril. At time of discharge pt was eager to leave to get something to eat.   CBC 9.7 (microcytic) from 10.1 1 month ago CMP Na 134, Cr 0.41, LFTs wnl  AB Pos on T/S UA trace LE with few bacteria, sent for culture   A&P: #[redacted] weeks gestation  #Abdominal Trauma 2/2 Fall 9/6 #DFM, resolved   #HIV disease in pregnancy: seen by infectious disease 9/6 prior to presenting to MAU, per chart review, no compliance issues identified with Biktarvy 50-200-25mg , labs pending from clinic today but at last check undetectable (<200) viremia, CD4 count 1173, normal kidney / liver / electrolytes  #Anemia of pregnancy  - sending in PO iron supplementation   #LBP - sending in PO Flexeril    Discharge from MAU in stable condition with strict / usual precautions Follow up at [facility] as scheduled for ongoing prenatal care  Allergies as of 12/14/2022   No Known Allergies      Medication List     TAKE these medications    Accu-Chek Guide test strip Generic drug: glucose blood Use as instructed QID    Accu-Chek Softclix Lancets lancets Use as instructed QID   acetaminophen 500 MG tablet Commonly known as: TYLENOL Take 2 tablets (1,000 mg total) by mouth every 8 (eight) hours as needed for moderate pain.   Biktarvy 50-200-25 MG Tabs tablet Generic drug: bictegravir-emtricitabine-tenofovir AF Take 1 tablet by mouth daily.   cefadroxil 500 MG capsule Commonly known as: DURICEF Take 1 capsule (500 mg total) by mouth 2 (two) times daily.   cyclobenzaprine 10 MG tablet Commonly known as: FLEXERIL Take 1 tablet (10 mg total) by mouth 3 (three) times daily as needed for up to 7 days for muscle spasms.   ferrous sulfate 325 (65 FE) MG tablet Take 1 tablet (325 mg total) by mouth every other day.   metFORMIN 500 MG tablet Commonly known as: GLUCOPHAGE Take 1 tablet (500 mg total) by mouth at bedtime.   PRENATAL GUMMIES PO Take by mouth.   terconazole 0.4 % vaginal cream Commonly known as: TERAZOL 7 Place 1 applicator vaginally at bedtime.   valACYclovir 500 MG tablet Commonly known as: Valtrex Take 1 tablet (500 mg total) by mouth 2 (two) times daily.        Hessie Dibble, MD 12/14/2022 9:01 PM

## 2022-12-14 NOTE — Progress Notes (Signed)
Brief Narrative   Patient ID: Alyssa Sandoval, female    DOB: 05/06/2000, 22 y.o.   MRN: [id number]  Alyssa Sandoval is a 22 y/o AA female diagnosed with HIV disease with risk factor of heterosexual contact. Initial CD4 count count of 566 with viral load of 3,460. Genotype with no significant medication resistance mutations. No history of opportunistic infection. [id number] negative. Sole ART medication of Biktarvy. Entered care at [facility] Stage 1.    Subjective:    Chief Complaint  Patient presents with   Follow-up    HPI:  Alyssa Sandoval is a 22 y.o. female with HIV disease last seen on 08/29/22 with low level viremia and at 19 weeks of pregnancy. Viral load was 164 with CD4 count 1,173. Continued on Biktarvy. Here today for follow up.  Alyssa Sandoval has been doing well since her last office visit and has good adherence and tolerance to Pollard taking it on a daily basis. No problems obtaining medication. Newly diagnosed with gestational diabetes. No new concerns/complaints. Following up with Obstetrics.   Denies fevers, chills, night sweats, headaches, changes in vision, neck pain/stiffness, nausea, diarrhea, vomiting, lesions or rashes.  No Known Allergies    Outpatient Medications Prior to Visit  Medication Sig Dispense Refill   Accu-Chek Softclix Lancets lancets Use as instructed QID 100 each 6   acetaminophen (TYLENOL) 500 MG tablet Take 2 tablets (1,000 mg total) by mouth every 8 (eight) hours as needed for moderate pain. 100 tablet 2   cyclobenzaprine (FLEXERIL) 10 MG tablet Take 1 tablet (10 mg total) by mouth 3 (three) times daily as needed for muscle spasms. 60 tablet 0   glucose blood (ACCU-CHEK GUIDE) test strip Use as instructed QID 100 each 6   metFORMIN (GLUCOPHAGE) 500 MG tablet Take 1 tablet (500 mg total) by mouth at bedtime. 60 tablet 2   Prenatal MV & Min w/FA-DHA (PRENATAL GUMMIES PO) Take by mouth.     terconazole (TERAZOL 7) 0.4 % vaginal cream Place 1 applicator  vaginally at bedtime. 45 g 0   valACYclovir (VALTREX) 500 MG tablet Take 1 tablet (500 mg total) by mouth 2 (two) times daily. 180 tablet 2   bictegravir-emtricitabine-tenofovir AF (BIKTARVY) 50-200-25 MG TABS tablet Take 1 tablet by mouth daily. 30 tablet 5   cefadroxil (DURICEF) 500 MG capsule Take 1 capsule (500 mg total) by mouth 2 (two) times daily. (Patient not taking: Reported on 12/14/2022) 14 capsule 0   No facility-administered medications prior to visit.     Past Medical History:  Diagnosis Date   Angio-edema    Chronic constipation 06/10/2012   COVID-19 04/2020   Depression    Dysfunctional uterine bleeding    GERD (gastroesophageal reflux disease) 06/10/2012   Headache 12/09/2020   Headache 12/09/2020   High-risk pregnancy 07/13/2021   HIV (human immunodeficiency virus infection) (HCC)    HIV (human immunodeficiency virus infection) (HCC)    HIV disease (HCC) 11/01/2020   -on Biktarvy, last viral load: 07/13/21: 12,000  12/19/21: not detected   EFW @ 28, 36wks     No testing        C/S @ 38wks (VL >1K) or IOL @ 39wks (VL <1K)   HSV (herpes simplex virus) anogenital infection    Low back pain 12/09/2020   Low back pain 12/09/2020   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   Passive suicidal ideations 11/07/2020   Passive suicidal ideations 11/07/2020   Polyhydramnios 02/13/2022   Postpartum hemorrhage 02/16/2022   Pregnancy induced  hypertension    Pyelonephritis 08/19/2021   Admission 5/13-5/15   Urine cx poc 09/11/21 neg   Urticaria    Vaginal delivery 02/16/2022   Vaginal pruritus 03/15/2021   Vaginal yeast infection      Past Surgical History:  Procedure Laterality Date   NO PAST SURGERIES        Review of Systems  Constitutional:  Negative for appetite change, chills, diaphoresis, fatigue, fever and unexpected weight change.  Eyes:        Negative for acute change in vision  Respiratory:  Negative for chest tightness, shortness of breath and wheezing.    Cardiovascular:  Negative for chest pain.  Gastrointestinal:  Negative for diarrhea, nausea and vomiting.  Genitourinary:  Negative for dysuria, pelvic pain and vaginal discharge.  Musculoskeletal:  Negative for neck pain and neck stiffness.  Skin:  Negative for rash.  Neurological:  Negative for seizures, syncope, weakness and headaches.  Hematological:  Negative for adenopathy. Does not bruise/bleed easily.  Psychiatric/Behavioral:  Negative for hallucinations.       Objective:    BP 101/72   Pulse 72   Resp 16   Ht 5' (1.524 m)   Wt 182 lb (82.6 kg)   LMP 04/18/2022 Comment: a month ago.  + home pregnancy test on 3/24  SpO2 98%   Breastfeeding No   BMI 35.54 kg/m  Nursing note and vital signs reviewed.  Physical Exam Constitutional:      General: She is not in acute distress.    Appearance: She is well-developed.  Eyes:     Conjunctiva/sclera: Conjunctivae normal.  Cardiovascular:     Rate and Rhythm: Normal rate and regular rhythm.     Heart sounds: Normal heart sounds. No murmur heard.    No friction rub. No gallop.  Pulmonary:     Effort: Pulmonary effort is normal. No respiratory distress.     Breath sounds: Normal breath sounds. No wheezing or rales.  Chest:     Chest wall: No tenderness.  Abdominal:     General: Bowel sounds are normal.     Palpations: Abdomen is soft.     Tenderness: There is no abdominal tenderness.  Musculoskeletal:     Cervical back: Neck supple.  Lymphadenopathy:     Cervical: No cervical adenopathy.  Skin:    General: Skin is warm and dry.     Findings: No rash.  Neurological:     Mental Status: She is alert and oriented to person, place, and time.  Psychiatric:        Behavior: Behavior normal.        Thought Content: Thought content normal.        Judgment: Judgment normal.         12/14/2022   11:10 AM 08/29/2022    3:16 PM 07/23/2022   10:06 AM 01/24/2022    9:51 AM 12/19/2021    9:46 AM  Depression screen PHQ 2/9   Decreased Interest 0 0 0 3 0  Down, Depressed, Hopeless 0 0 0 0 0  PHQ - 2 Score 0 0 0 3 0  Altered sleeping   3 3   Tired, decreased energy   3 3   Change in appetite   0 0   Feeling bad or failure about yourself    0 0   Trouble concentrating   0 0   Moving slowly or fidgety/restless   0 0   Suicidal thoughts   0 0  PHQ-9 Score   6 9        Assessment & Plan:    Patient Active Problem List   Diagnosis Date Noted   GBS bacteriuria 11/24/2022   History of postpartum hemorrhage 10/31/2022   Gestational diabetes 10/19/2022   HIV (human immunodeficiency virus) risk factors complicating pregnancy 10/17/2022   Supervision of high risk pregnancy, antepartum 07/17/2022   Short interval between pregnancies affecting pregnancy, antepartum 07/17/2022   HIV disease (HCC) 01/29/2022   HSV (herpes simplex virus) anogenital infection 08/09/2021     Problem List Items Addressed This Visit       Other   HIV disease (HCC)    Alyssa Sandoval has been doing well since her last office visit and reports taking her medication daily as prescribed. Low level virema with last work although considered undetectable (<200). Check lab work today. Discussed guidelines for vaginal delivery vs C-section and encouraged continued discussion with Obstetrics. She has plans for formula feeding. Continue current dose of Biktarvy. Plan for follow up in 4 months or sooner if needed.       Relevant Medications   bictegravir-emtricitabine-tenofovir AF (BIKTARVY) 50-200-25 MG TABS tablet   HIV (human immunodeficiency virus) risk factors complicating pregnancy - Primary   Relevant Medications   bictegravir-emtricitabine-tenofovir AF (BIKTARVY) 50-200-25 MG TABS tablet     I am having Alyssa Sandoval maintain her Prenatal MV & Min w/FA-DHA (PRENATAL GUMMIES PO), Accu-Chek Guide, Accu-Chek Softclix Lancets, valACYclovir, metFORMIN, cyclobenzaprine, acetaminophen, cefadroxil, terconazole, and Biktarvy.   Meds  ordered this encounter  Medications   bictegravir-emtricitabine-tenofovir AF (BIKTARVY) 50-200-25 MG TABS tablet    Sig: Take 1 tablet by mouth daily.    Dispense:  30 tablet    Refill:  5    Order Specific Question:   Supervising Provider    Answer:   Judyann Munson [[id number]]     Follow-up: Return in about 4 months (around 04/15/2023), or if symptoms worsen or fail to improve.   Marcos Eke, MSN, FNP-C Nurse Practitioner [facility] Main number: [phone]

## 2022-12-14 NOTE — MAU Note (Signed)
Alyssa Sandoval is a 22 y.o. at [redacted]w[redacted]d here in MAU reporting: decreased FM, reports feeling movement, but less than usual.  Also states son fell onto the left side of her abdomen yesterday and has since had lower/middle back and abdominal pain. Reports took Tylenol and muscle relaxer last night. Denies VB or LOF LMP: NA Onset of complaint: last night Pain score: 9 back & 8 abdomen Vitals:   12/14/22 1657  BP: 109/72  Pulse: 99  Resp: 18  Temp: 98 F (36.7 C)  SpO2: 100%     FHT:140 bpm Lab orders placed from triage:   UA

## 2022-12-14 NOTE — Assessment & Plan Note (Signed)
Alyssa Sandoval has been doing well since her last office visit and reports taking her medication daily as prescribed. Low level virema with last work although considered undetectable (<200). Check lab work today. Discussed guidelines for vaginal delivery vs C-section and encouraged continued discussion with Obstetrics. She has plans for formula feeding. Continue current dose of Biktarvy. Plan for follow up in 4 months or sooner if needed.

## 2022-12-15 ENCOUNTER — Encounter (HOSPITAL_COMMUNITY): Payer: Self-pay | Admitting: Obstetrics & Gynecology

## 2022-12-15 ENCOUNTER — Inpatient Hospital Stay (HOSPITAL_COMMUNITY)
Admission: AD | Admit: 2022-12-15 | Discharge: 2022-12-15 | Disposition: A | Discharge status: Home / Self Care | Payer: MEDICAID | Attending: Obstetrics & Gynecology | Admitting: Obstetrics & Gynecology

## 2022-12-15 ENCOUNTER — Encounter: Payer: Self-pay | Admitting: Obstetrics & Gynecology

## 2022-12-15 ENCOUNTER — Other Ambulatory Visit: Payer: Self-pay

## 2022-12-15 DIAGNOSIS — O99343 Other mental disorders complicating pregnancy, third trimester: Secondary | ICD-10-CM | POA: Insufficient documentation

## 2022-12-15 DIAGNOSIS — Z3A34 34 weeks gestation of pregnancy: Secondary | ICD-10-CM | POA: Diagnosis not present

## 2022-12-15 DIAGNOSIS — F32A Depression, unspecified: Secondary | ICD-10-CM | POA: Insufficient documentation

## 2022-12-15 MED ORDER — TRAZODONE HCL 50 MG PO TABS
50.0000 mg | ORAL_TABLET | Freq: Every evening | ORAL | 0 refills | Status: DC | PRN
Start: 2022-12-15 — End: 2022-12-25

## 2022-12-15 MED ORDER — SERTRALINE HCL 25 MG PO TABS
25.0000 mg | ORAL_TABLET | Freq: Once | ORAL | Status: AC
  Administered 2022-12-15: 25 mg via ORAL
  Filled 2022-12-15: qty 1

## 2022-12-15 MED ORDER — SERTRALINE HCL 50 MG PO TABS
ORAL_TABLET | ORAL | 2 refills | Status: DC
Start: 2022-12-15 — End: 2024-02-12

## 2022-12-15 NOTE — Discharge Instructions (Signed)

## 2022-12-15 NOTE — MAU Note (Signed)
Alyssa Sandoval is a 22 y.o. at [redacted]w[redacted]d here in MAU reporting: she's has depression, states "been breaking down, don't want to be pregnant no more, and been crying."  States has been pregnant for 17 months.  Denies thoughts of want to kill or harm self. Denies VB or LOF.  Endorses +FM, but less than usual. LMP: NA Onset of complaint: today Pain score: 7 Vitals:   12/15/22 1613  BP: 114/71  Pulse: 99  Resp: 18  Temp: 98.1 F (36.7 C)  SpO2: 98%     FHT: 142 bpm Lab orders placed from triage:   None

## 2022-12-15 NOTE — MAU Provider Note (Signed)
History     CSN: [id number]  Arrival date and time: 12/15/22 1557   Event Date/Time   First Provider Initiated Contact with Patient 12/15/22 1631      Chief Complaint  Patient presents with   Fatigue   HPI  Alyssa Sandoval is a 22 y.o. G3P1011 at [redacted]w[redacted]d who presents for evaluation of depression. Patient reports she is tired of being pregnant because she has been pregnant for "17 months." She feels very depressed because she knows she cannot be delivered now and it feels like there is no end in sight. She denies any SI/HI. She presents with her grandmother who reports she is safe at home. She denies any pain.  She denies any vaginal bleeding, discharge, and leaking of fluid. Denies any constipation, diarrhea or any urinary complaints. Reports normal fetal movement.   OB History     Gravida  3   Para  1   Term  1   Preterm  0   AB  1   Living  1      SAB  1   IAB  0   Ectopic  0   Multiple  0   Live Births  1           Past Medical History:  Diagnosis Date   Angio-edema    Chronic constipation 06/10/2012   COVID-19 04/2020   Depression    Dysfunctional uterine bleeding    GERD (gastroesophageal reflux disease) 06/10/2012   Headache 12/09/2020   Headache 12/09/2020   High-risk pregnancy 07/13/2021   HIV (human immunodeficiency virus infection) (HCC)    HIV (human immunodeficiency virus infection) (HCC)    HIV disease (HCC) 11/01/2020   -on Biktarvy, last viral load: 07/13/21: 12,000  12/19/21: not detected   EFW @ 28, 36wks     No testing        C/S @ 38wks (VL >1K) or IOL @ 39wks (VL <1K)   HSV (herpes simplex virus) anogenital infection    Low back pain 12/09/2020   Low back pain 12/09/2020   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   Passive suicidal ideations 11/07/2020   Passive suicidal ideations 11/07/2020   Polyhydramnios 02/13/2022   Postpartum hemorrhage 02/16/2022   Pregnancy induced hypertension    Pyelonephritis 08/19/2021   Admission 5/13-5/15    Urine cx poc 09/11/21 neg   Urticaria    Vaginal delivery 02/16/2022   Vaginal pruritus 03/15/2021   Vaginal yeast infection     Past Surgical History:  Procedure Laterality Date   NO PAST SURGERIES      Family History  Problem Relation Age of Onset   Diabetes Mother    Asthma Mother    Obesity Mother    Fibromyalgia Mother    Mental illness Mother    Other Father        not to her knowledge, re med problems   Diabetes Maternal Uncle    Asthma Maternal Grandmother    Hypertension Maternal Grandmother    Cancer Maternal Grandfather    Colon cancer Maternal Grandfather    Cancer Other    Heart disease Neg Hx     Social History   Tobacco Use   Smoking status: Former    Types: Cigars, E-cigarettes   Smokeless tobacco: Never   Tobacco comments:    vapes  Vaping Use   Vaping status: Former   Substances: Nicotine, CBD, Flavoring  Substance Use Topics   Alcohol use: No   Drug use: Not Currently  Types: Marijuana    Comment: more than a year ago    Allergies: No Known Allergies  No medications prior to admission.    Review of Systems  Constitutional: Negative.  Negative for chills, fatigue and fever.  HENT: Negative.    Respiratory: Negative.  Negative for shortness of breath.   Cardiovascular: Negative.  Negative for chest pain.  Gastrointestinal: Negative.  Negative for abdominal pain, constipation, diarrhea, nausea and vomiting.  Genitourinary: Negative.  Negative for dysuria.  Neurological: Negative.  Negative for dizziness and headaches.   Physical Exam   Blood pressure 112/73, pulse 98, temperature 98.1 F (36.7 C), temperature source Oral, resp. rate 16, height 5' (1.524 m), weight 81.9 kg, last menstrual period 04/18/2022, SpO2 99%, not currently breastfeeding.  Patient Vitals for the past 24 hrs:  BP Temp Temp src Pulse Resp SpO2 Height Weight  12/15/22 1727 112/73 -- -- 98 16 99 % -- --  12/15/22 1640 -- -- -- -- -- 96 % -- --  12/15/22 1635  -- -- -- -- -- 97 % -- --  12/15/22 1630 -- -- -- -- -- 98 % -- --  12/15/22 1628 112/74 -- -- (!) 103 -- -- -- --  12/15/22 1613 114/71 98.1 F (36.7 C) Oral 99 18 98 % -- --  12/15/22 1608 -- -- -- -- -- -- 5' (1.524 m) 81.9 kg    Physical Exam Vitals and nursing note reviewed.  Constitutional:      General: She is not in acute distress.    Appearance: She is well-developed.  HENT:     Head: Normocephalic.  Eyes:     Pupils: Pupils are equal, round, and reactive to light.  Cardiovascular:     Rate and Rhythm: Normal rate and regular rhythm.     Heart sounds: Normal heart sounds.  Pulmonary:     Effort: Pulmonary effort is normal. No respiratory distress.     Breath sounds: Normal breath sounds.  Abdominal:     General: Bowel sounds are normal. There is no distension.     Palpations: Abdomen is soft.     Tenderness: There is no abdominal tenderness.  Skin:    General: Skin is warm and dry.  Neurological:     Mental Status: She is alert and oriented to person, place, and time.  Psychiatric:        Mood and Affect: Mood is anxious. Affect is tearful.        Behavior: Behavior is withdrawn.        Thought Content: Thought content normal. Thought content does not include homicidal or suicidal ideation. Thought content does not include homicidal or suicidal plan.        Judgment: Judgment normal.     Fetal Tracing:  Baseline: 125 Variability: moderate Accels: 15x15 Decels: none  Toco: 2-3 with UI  MAU Course  Procedures   MDM Labs ordered and reviewed.   NST reactive No SI/HI ideation and patient verbally contracts for safety Discussed option to start medication and patient agreeable. Will do weekly visits at office to monitor mood. Message sent to office.   Assessment and Plan   1. Depression affecting pregnancy   2. [redacted] weeks gestation of pregnancy     -Discharge home in stable condition -Rx for zoloft and trazadone sent to pharmacy -SI/HI precautions  discussed -Patient advised to follow-up with OB this week -Patient may return to MAU as needed or if her condition were to change or worsen  Rolm Bookbinder, CNM 12/15/2022, 4:32 PM

## 2022-12-17 LAB — COMPLETE METABOLIC PANEL WITH GFR
AG Ratio: 1.3 (calc) (ref 1.0–2.5)
ALT: 13 U/L (ref 6–29)
AST: 12 U/L (ref 10–30)
Albumin: 3.4 g/dL — ABNORMAL LOW (ref 3.6–5.1)
Alkaline phosphatase (APISO): 108 U/L (ref 31–125)
BUN/Creatinine Ratio: 24 (calc) — ABNORMAL HIGH (ref 6–22)
BUN: 10 mg/dL (ref 7–25)
CO2: 19 mmol/L — ABNORMAL LOW (ref 20–32)
Calcium: 9.1 mg/dL (ref 8.6–10.2)
Chloride: 105 mmol/L (ref 98–110)
Creat: 0.42 mg/dL — ABNORMAL LOW (ref 0.50–0.96)
Globulin: 2.6 g/dL (ref 1.9–3.7)
Glucose, Bld: 107 mg/dL — ABNORMAL HIGH (ref 65–99)
Potassium: 3.8 mmol/L (ref 3.5–5.3)
Sodium: 135 mmol/L (ref 135–146)
Total Bilirubin: 0.3 mg/dL (ref 0.2–1.2)
Total Protein: 6 g/dL — ABNORMAL LOW (ref 6.1–8.1)
eGFR: 141 mL/min/{1.73_m2} (ref >=60)

## 2022-12-17 LAB — HIV-1 RNA QUANT-NO REFLEX-BLD
HIV 1 RNA Quant: 51 {copies}/mL — ABNORMAL HIGH
HIV-1 RNA Quant, Log: 1.71 {Log_copies}/mL — ABNORMAL HIGH

## 2022-12-17 LAB — CULTURE, OB URINE: Culture: >=100000 — AB

## 2022-12-17 LAB — T-HELPER CELLS (CD4) COUNT (NOT AT ARMC)
Absolute CD4: 895 {cells}/uL (ref 490–1740)
CD4 T Helper %: 44 % (ref 30–61)
Total lymphocyte count: 2017 {cells}/uL (ref 850–3900)

## 2022-12-18 ENCOUNTER — Telehealth (INDEPENDENT_AMBULATORY_CARE_PROVIDER_SITE_OTHER): Payer: MEDICAID

## 2022-12-18 DIAGNOSIS — Z3A34 34 weeks gestation of pregnancy: Secondary | ICD-10-CM

## 2022-12-18 DIAGNOSIS — O2342 Unspecified infection of urinary tract in pregnancy, second trimester: Secondary | ICD-10-CM

## 2022-12-18 DIAGNOSIS — O2343 Unspecified infection of urinary tract in pregnancy, third trimester: Secondary | ICD-10-CM

## 2022-12-18 MED ORDER — CEFADROXIL 500 MG PO CAPS: 500.0000 mg | ORAL_CAPSULE | Freq: Two times a day (BID) | ORAL | 0 refills | Status: DC

## 2022-12-18 NOTE — Telephone Encounter (Signed)
Alyssa Sandoval [birth year] [id number]   Patient called, at her home, and verified her identity via birth date and last 4 of her SSN.  Patient agreeable to results via phone and was informed of positive urine culture.  Reviewed treatment and need for repeat testing in 3-4 weeks.  Patient questions regarding E. Coli addressed.  Patient requests medications be sent to [facility] in [place].  Rx for Duricef 500mg  Disp 14, RF 0 sent.  Patient without further questions or concerns.Cherre Robins MSN, CNM Advanced Practice Provider, Center for [facility]  **This visit was completed, in its entirety, via telehealth communications.  I personally spent =2 minutes on the phone providing recommendations, education, and guidance.Provider was located at [facility] during this interaction.**

## 2022-12-20 ENCOUNTER — Ambulatory Visit: Payer: MEDICAID

## 2022-12-20 ENCOUNTER — Other Ambulatory Visit (HOSPITAL_COMMUNITY): Payer: Self-pay | Admitting: Family Medicine

## 2022-12-20 ENCOUNTER — Telehealth: Payer: Self-pay | Admitting: *Deleted

## 2022-12-20 ENCOUNTER — Encounter: Payer: Self-pay | Admitting: Obstetrics & Gynecology

## 2022-12-20 DIAGNOSIS — O2342 Unspecified infection of urinary tract in pregnancy, second trimester: Secondary | ICD-10-CM

## 2022-12-20 MED ORDER — NITROFURANTOIN MONOHYD MACRO 100 MG PO CAPS
100.0000 mg | ORAL_CAPSULE | Freq: Two times a day (BID) | ORAL | 1 refills | Status: DC
Start: 2022-12-20 — End: 2022-12-25

## 2022-12-20 NOTE — Progress Notes (Signed)
E coli UTI. Sent message to patient to pick up prescription.    Mittie Bodo, MD Family Medicine - Obstetrics Fellow

## 2022-12-20 NOTE — Telephone Encounter (Signed)
I called patient to discuss her 2 MyChart messages from today . She c/o spotting a little - states it is pink and has noticed twice she denies recent intercourse or vaginal exam. We discussed this can be normal but if it changes to bleeding she needs to go to hospital to be evaluated. She c/o contractions. Upon further discussion she states she is having them every 10-15 minutes for days now since she went to the hospital. They have not gotten more frequent. I advised to go to hospital if they get closer together. She c/o pain in upper right side when she has contraction. I advised to go to hospital if it becomes stronger or contractions more frequent. She reports she went to pharmacy and could not get her Duricef- they did not have it. I clarified it was [place] and that it looks like it was sent in. I informed her I would call the pharmacy and make sure they had received the order, and if not , I would give them the order . I also informed her I would call her if I had any problems. I also advised she call the pharmacy to make sure it is ready before she goes to pick it up. She also c/o " hurts to pee". I explained this is because she has a UTI and when she starts the duricef that should help. She voices understanding. I called [facility] and left a voicemail stating I was calling to see if they received the RX and I gave the RX instructions/ dosage and to call if questions.  Nancy Fetter

## 2022-12-21 ENCOUNTER — Ambulatory Visit (HOSPITAL_BASED_OUTPATIENT_CLINIC_OR_DEPARTMENT_OTHER): Payer: MEDICAID

## 2022-12-21 ENCOUNTER — Ambulatory Visit: Payer: MEDICAID | Attending: Obstetrics | Admitting: *Deleted

## 2022-12-21 VITALS — BP 114/63 | HR 100

## 2022-12-21 DIAGNOSIS — O0990 Supervision of high risk pregnancy, unspecified, unspecified trimester: Secondary | ICD-10-CM

## 2022-12-21 DIAGNOSIS — O98713 Human immunodeficiency virus [HIV] disease complicating pregnancy, third trimester: Secondary | ICD-10-CM | POA: Diagnosis not present

## 2022-12-21 DIAGNOSIS — R8271 Bacteriuria: Secondary | ICD-10-CM

## 2022-12-21 DIAGNOSIS — O09899 Supervision of other high risk pregnancies, unspecified trimester: Secondary | ICD-10-CM

## 2022-12-21 DIAGNOSIS — O24419 Gestational diabetes mellitus in pregnancy, unspecified control: Secondary | ICD-10-CM | POA: Diagnosis not present

## 2022-12-21 DIAGNOSIS — Z3A35 35 weeks gestation of pregnancy: Secondary | ICD-10-CM

## 2022-12-21 DIAGNOSIS — O09893 Supervision of other high risk pregnancies, third trimester: Secondary | ICD-10-CM | POA: Insufficient documentation

## 2022-12-21 DIAGNOSIS — O24415 Gestational diabetes mellitus in pregnancy, controlled by oral hypoglycemic drugs: Secondary | ICD-10-CM | POA: Diagnosis not present

## 2022-12-21 NOTE — Procedures (Signed)
Alyssa Sandoval 2000-12-26 [redacted]w[redacted]d  Fetus A Non-Stress Test Interpretation for 12/21/22  Indication: Gestational Diabetes medication controlled and HIV   Fetal Heart Rate A Mode: External Baseline Rate (A): 140 bpm Variability: Moderate Accelerations: 15 x 15 Decelerations: None  Uterine Activity Mode: Toco Contraction Frequency (min): No UC's Resting Tone Palpated: Relaxed  Interpretation (Fetal Testing) Nonstress Test Interpretation: Reactive Overall Impression: Reassuring for gestational age Comments: Tracing reviewed by Dr. Deedra Ehrich  12/21/22

## 2022-12-25 ENCOUNTER — Ambulatory Visit (INDEPENDENT_AMBULATORY_CARE_PROVIDER_SITE_OTHER): Payer: MEDICAID | Admitting: Obstetrics and Gynecology

## 2022-12-25 ENCOUNTER — Other Ambulatory Visit: Payer: Self-pay

## 2022-12-25 ENCOUNTER — Other Ambulatory Visit (HOSPITAL_COMMUNITY)
Admission: RE | Admit: 2022-12-25 | Discharge: 2022-12-25 | Disposition: A | Discharge status: Home / Self Care | Payer: MEDICAID | Source: Ambulatory Visit | Attending: Obstetrics and Gynecology | Admitting: Obstetrics and Gynecology

## 2022-12-25 VITALS — BP 113/66 | Wt 181.3 lb

## 2022-12-25 DIAGNOSIS — O24415 Gestational diabetes mellitus in pregnancy, controlled by oral hypoglycemic drugs: Secondary | ICD-10-CM

## 2022-12-25 DIAGNOSIS — O0993 Supervision of high risk pregnancy, unspecified, third trimester: Secondary | ICD-10-CM

## 2022-12-25 DIAGNOSIS — A609 Anogenital herpesviral infection, unspecified: Secondary | ICD-10-CM

## 2022-12-25 DIAGNOSIS — O09893 Supervision of other high risk pregnancies, third trimester: Secondary | ICD-10-CM

## 2022-12-25 DIAGNOSIS — O283 Abnormal ultrasonic finding on antenatal screening of mother: Secondary | ICD-10-CM

## 2022-12-25 DIAGNOSIS — R8271 Bacteriuria: Secondary | ICD-10-CM

## 2022-12-25 DIAGNOSIS — Z3A35 35 weeks gestation of pregnancy: Secondary | ICD-10-CM

## 2022-12-25 DIAGNOSIS — O0990 Supervision of high risk pregnancy, unspecified, unspecified trimester: Secondary | ICD-10-CM | POA: Diagnosis present

## 2022-12-25 MED ORDER — VALACYCLOVIR HCL 500 MG PO TABS
500.0000 mg | ORAL_TABLET | Freq: Two times a day (BID) | ORAL | 6 refills | Status: DC
Start: 2022-12-25 — End: 2023-01-12

## 2022-12-25 NOTE — Progress Notes (Signed)
Subjective:  MAILE BREVIK is a 22 y.o. G3P1011 at [redacted]w[redacted]d being seen today for ongoing prenatal care.  She is currently monitored for the following issues for this high-risk pregnancy and has HSV (herpes simplex virus) anogenital infection; HIV disease (HCC); Supervision of high risk pregnancy, antepartum; Short interval between pregnancies affecting pregnancy, antepartum; HIV (human immunodeficiency virus) risk factors complicating pregnancy; Gestational diabetes; History of postpartum hemorrhage; GBS bacteriuria; and Abnormal fetal ultrasound on their problem list.  Patient reports  general discomforts of pregnancy .  Contractions: Not present. Vag. Bleeding: None.  Movement: Present. Denies leaking of fluid.   The following portions of the patient's history were reviewed and updated as appropriate: allergies, current medications, past family history, past medical history, past social history, past surgical history and problem list. Problem list updated.  Objective:   Vitals:   12/25/22 1632  BP: 113/66  Weight: 82.2 kg    Fetal Status: Fetal Heart Rate (bpm): 136   Movement: Present     General:  Alert, oriented and cooperative. Patient is in no acute distress.  Skin: Skin is warm and dry. No rash noted.   Cardiovascular: Normal heart rate noted  Respiratory: Normal respiratory effort, no problems with respiration noted  Abdomen: Soft, gravid, appropriate for gestational age. Pain/Pressure: Present     Pelvic:  Cervical exam deferred        Extremities: Normal range of motion.  Edema: Trace  Mental Status: Normal mood and affect. Normal behavior. Normal judgment and thought content.   Urinalysis:      Assessment and Plan:  Pregnancy: G3P1011 at [redacted]w[redacted]d  1. Supervision of high risk pregnancy, antepartum Stable Labor precautions - Cervicovaginal ancillary only( [place])  2. Gestational diabetes mellitus (GDM) in third trimester controlled on oral hypoglycemic drug CBG's  in goal range per pt report Serial growth scan and antenatal testing as per MFM IOL 38-39 weeks  3. HIV risk factors affecting pregnancy in third trimester Stable Viral 51 on 12/14/22 Followed by ID  4. GBS bacteriuria Tx while in labor  5. HSV (herpes simplex virus) anogenital infection Continue with suppression - valACYclovir (VALTREX) 500 MG tablet; Take 1 tablet (500 mg total) by mouth 2 (two) times daily.  Dispense: 60 tablet; Refill: 6  6. Abnormal fetal ultrasound Chest mass. Difficult to see on last U/S  Preterm labor symptoms and general obstetric precautions including but not limited to vaginal bleeding, contractions, leaking of fluid and fetal movement were reviewed in detail with the patient. Please refer to After Visit Summary for other counseling recommendations.  Return in about 1 week (around 01/01/2023) for OB visit, face to face, MD only.   Hermina Staggers, MD

## 2022-12-26 ENCOUNTER — Telehealth: Payer: Self-pay

## 2022-12-26 ENCOUNTER — Encounter: Payer: Self-pay | Admitting: Obstetrics & Gynecology

## 2022-12-26 ENCOUNTER — Encounter (HOSPITAL_COMMUNITY): Payer: Self-pay | Admitting: Obstetrics & Gynecology

## 2022-12-26 ENCOUNTER — Inpatient Hospital Stay (HOSPITAL_COMMUNITY)
Admission: AD | Admit: 2022-12-26 | Discharge: 2022-12-26 | Disposition: A | Discharge status: Home / Self Care | Payer: MEDICAID | Attending: Obstetrics & Gynecology | Admitting: Obstetrics & Gynecology

## 2022-12-26 DIAGNOSIS — O479 False labor, unspecified: Secondary | ICD-10-CM | POA: Diagnosis not present

## 2022-12-26 DIAGNOSIS — O0990 Supervision of high risk pregnancy, unspecified, unspecified trimester: Secondary | ICD-10-CM

## 2022-12-26 DIAGNOSIS — Z3A36 36 weeks gestation of pregnancy: Secondary | ICD-10-CM | POA: Diagnosis not present

## 2022-12-26 DIAGNOSIS — O4703 False labor before 37 completed weeks of gestation, third trimester: Secondary | ICD-10-CM | POA: Diagnosis present

## 2022-12-26 DIAGNOSIS — O09899 Supervision of other high risk pregnancies, unspecified trimester: Secondary | ICD-10-CM

## 2022-12-26 DIAGNOSIS — R8271 Bacteriuria: Secondary | ICD-10-CM

## 2022-12-26 HISTORY — DX: Gestational diabetes mellitus in pregnancy, unspecified control: O24.419

## 2022-12-26 HISTORY — DX: Anemia, unspecified: D64.9

## 2022-12-26 HISTORY — DX: Type 2 diabetes mellitus without complications: E11.9

## 2022-12-26 NOTE — MAU Provider Note (Signed)
Alyssa Sandoval is a 22 y.o. G47P1011 female at [redacted]w[redacted]d.  RN Labor check, not seen by provider.  SVE by RN: Dilation: 3 Effacement (%): 60 Station: Ballotable Exam by:: jolynn  NST: FHR baseline 135 bpm, Variability: moderate, Accelerations:present, Decelerations:  Absent= Cat I/Reactive Toco: irregular  A/P: False labor Dispo: D/C home  Alyssa Maples, MD/MPH Attending Family Medicine Physician, [facility] for [facility], [facility]  12/26/2022 9:48 AM

## 2022-12-26 NOTE — MAU Note (Signed)
Pt says PNC- clinic No VE in office  UC's strong since 0400 Has HX of HSV- Denies any outbreak during preg- Denies any S/S now.  Takes Valtrex daily GBS- collected yesterday

## 2022-12-26 NOTE — Telephone Encounter (Signed)
Pt called regarding having frequent contractions and went to the MAU this morning and was discharged home for false labor. Pt notified RN that she was 3 cm dilated and having contractions. RN notified pt to go back to the MAU if she has frequent contractions that are 5 minutes apart and cannot breathe through her contractions. Pt verbalized understanding and denies any further needs at this time.  Marcelino Duster, RN 12/26/22

## 2022-12-27 ENCOUNTER — Ambulatory Visit: Payer: MEDICAID | Admitting: *Deleted

## 2022-12-27 ENCOUNTER — Ambulatory Visit: Payer: MEDICAID | Attending: Obstetrics

## 2022-12-27 VITALS — BP 112/78 | HR 86

## 2022-12-27 DIAGNOSIS — B20 Human immunodeficiency virus [HIV] disease: Secondary | ICD-10-CM

## 2022-12-27 DIAGNOSIS — R8271 Bacteriuria: Secondary | ICD-10-CM | POA: Insufficient documentation

## 2022-12-27 DIAGNOSIS — O09899 Supervision of other high risk pregnancies, unspecified trimester: Secondary | ICD-10-CM

## 2022-12-27 DIAGNOSIS — O24415 Gestational diabetes mellitus in pregnancy, controlled by oral hypoglycemic drugs: Secondary | ICD-10-CM | POA: Diagnosis not present

## 2022-12-27 DIAGNOSIS — O0990 Supervision of high risk pregnancy, unspecified, unspecified trimester: Secondary | ICD-10-CM | POA: Diagnosis present

## 2022-12-27 DIAGNOSIS — O98713 Human immunodeficiency virus [HIV] disease complicating pregnancy, third trimester: Secondary | ICD-10-CM

## 2022-12-27 DIAGNOSIS — E669 Obesity, unspecified: Secondary | ICD-10-CM

## 2022-12-27 DIAGNOSIS — B009 Herpesviral infection, unspecified: Secondary | ICD-10-CM

## 2022-12-27 DIAGNOSIS — Z3A36 36 weeks gestation of pregnancy: Secondary | ICD-10-CM

## 2022-12-27 DIAGNOSIS — O99213 Obesity complicating pregnancy, third trimester: Secondary | ICD-10-CM

## 2022-12-27 DIAGNOSIS — O368130 Decreased fetal movements, third trimester, not applicable or unspecified: Secondary | ICD-10-CM

## 2022-12-27 DIAGNOSIS — O98813 Other maternal infectious and parasitic diseases complicating pregnancy, third trimester: Secondary | ICD-10-CM

## 2022-12-31 ENCOUNTER — Inpatient Hospital Stay (HOSPITAL_COMMUNITY)
Admission: AD | Admit: 2022-12-31 | Discharge: 2022-12-31 | Disposition: A | Discharge status: Home / Self Care | Payer: MEDICAID | Attending: Obstetrics and Gynecology | Admitting: Obstetrics and Gynecology

## 2022-12-31 ENCOUNTER — Encounter (HOSPITAL_COMMUNITY): Payer: Self-pay | Admitting: Obstetrics and Gynecology

## 2022-12-31 DIAGNOSIS — O26893 Other specified pregnancy related conditions, third trimester: Secondary | ICD-10-CM | POA: Insufficient documentation

## 2022-12-31 DIAGNOSIS — O99343 Other mental disorders complicating pregnancy, third trimester: Secondary | ICD-10-CM | POA: Diagnosis not present

## 2022-12-31 DIAGNOSIS — F419 Anxiety disorder, unspecified: Secondary | ICD-10-CM | POA: Diagnosis not present

## 2022-12-31 DIAGNOSIS — R519 Headache, unspecified: Secondary | ICD-10-CM | POA: Diagnosis not present

## 2022-12-31 DIAGNOSIS — Z3A36 36 weeks gestation of pregnancy: Secondary | ICD-10-CM | POA: Diagnosis not present

## 2022-12-31 DIAGNOSIS — O479 False labor, unspecified: Secondary | ICD-10-CM

## 2022-12-31 MED ORDER — HYDROXYZINE HCL 25 MG PO TABS: 25.0000 mg | ORAL_TABLET | Freq: Three times a day (TID) | ORAL | 0 refills | Status: DC | PRN

## 2022-12-31 MED ORDER — HYDROXYZINE HCL 50 MG PO TABS
50.0000 mg | ORAL_TABLET | Freq: Once | ORAL | Status: AC
  Administered 2022-12-31: 50 mg via ORAL
  Filled 2022-12-31: qty 1

## 2022-12-31 MED ORDER — ACETAMINOPHEN-CAFFEINE 500-65 MG PO TABS
2.0000 | ORAL_TABLET | Freq: Once | ORAL | Status: AC
  Administered 2022-12-31: 2 via ORAL
  Filled 2022-12-31: qty 2

## 2022-12-31 NOTE — MAU Note (Signed)
.  Alyssa Sandoval is a 22 y.o. at [redacted]w[redacted]d here in MAU reporting: lower abdominal + back contractions that started this morning and have become more progressive throughout the day. Reporting that they are 5 minutes apart. She also reports stabbing pain in her right mid-upper chest that occurs mainly with contractions. The chest pain started around 1600 this afternoon. Also has a on-going HA that has not responded to tylenol or flexeril (last taken around 1500).   Denies VB or LOF. Reports +FM.   Onset of complaint: This AM Pain score: 6/10 abdomen, 6/10 back, 10/10 chest Vitals:   12/31/22 1840  BP: 110/85  Pulse: (!) 105  Resp: 18  Temp: 98.8 F (37.1 C)  SpO2: 99%     FHT:155 Lab orders placed from triage:  EKG

## 2022-12-31 NOTE — MAU Provider Note (Addendum)
History     CSN: [id number]  Arrival date and time: 12/31/22 1820   Event Date/Time   First Provider Initiated Contact with Patient 12/31/22 1858      Chief Complaint  Patient presents with   Contractions   Back Pain   Chest Pain   HPI [name] is a 22 y.o. year old G2P1011 female at [redacted]w[redacted]d weeks gestation who presents to MAU reporting lower abdominal pain, contractions in her lower back since this morning that are now increasing to every 5 mins. She also reports feeling a stabbing pain in the upper RT part f her chest, "because I'm too tired and ready to have this baby." She reports the upper chest pain occurs mainly during contractions and started at 1600 this afternoon. She also reports an on-going H/A that has not resolved with Tylenol or Flexeril; last taken at 1500 today. She receives [facility] with [facility]; next appt is 01/03/2023. Her pregnancy is complicated by closely spaced pregnancies, HSV (on Valtrex), HIV (on Biktarvy), A2GDM (on Metformin), Anxiety/Depression (on Zoloft), and Anemia (on oral iron). Her grandmother is present and contributing to the history taking.   OB History     Gravida  3   Para  1   Term  1   Preterm  0   AB  1   Living  1      SAB  1   IAB  0   Ectopic  0   Multiple  0   Live Births  1           Past Medical History:  Diagnosis Date   Anemia    Angio-edema    Chronic constipation 06/10/2012   COVID-19 04/2020   Depression    Diabetes mellitus without complication (HCC)    Dysfunctional uterine bleeding    GERD (gastroesophageal reflux disease) 06/10/2012   Gestational diabetes    Headache 12/09/2020   Headache 12/09/2020   High-risk pregnancy 07/13/2021   HIV (human immunodeficiency virus infection) (HCC)    HIV (human immunodeficiency virus infection) (HCC)    HIV disease (HCC) 11/01/2020   -on Biktarvy, last viral load: 07/13/21: 12,000  12/19/21: not detected   EFW @ 28, 36wks     No testing        C/S @ 38wks  (VL >1K) or IOL @ 39wks (VL <1K)   HSV (herpes simplex virus) anogenital infection    Low back pain 12/09/2020   Low back pain 12/09/2020   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   Passive suicidal ideations 11/07/2020   Passive suicidal ideations 11/07/2020   Polyhydramnios 02/13/2022   Postpartum hemorrhage 02/16/2022   Pregnancy induced hypertension    Pyelonephritis 08/19/2021   Admission 5/13-5/15   Urine cx poc 09/11/21 neg   Urticaria    Vaginal delivery 02/16/2022   Vaginal pruritus 03/15/2021   Vaginal yeast infection     Past Surgical History:  Procedure Laterality Date   NO PAST SURGERIES      Family History  Problem Relation Age of Onset   Diabetes Mother    Asthma Mother    Obesity Mother    Fibromyalgia Mother    Mental illness Mother    Other Father        not to her knowledge, re med problems   Diabetes Maternal Uncle    Asthma Maternal Grandmother    Hypertension Maternal Grandmother    Cancer Maternal Grandfather    Colon cancer Maternal Grandfather    Cancer  Other    Heart disease Neg Hx     Social History   Tobacco Use   Smoking status: Former    Types: Cigars, E-cigarettes   Smokeless tobacco: Never   Tobacco comments:    vapes  Vaping Use   Vaping status: Former   Substances: Nicotine, CBD, Flavoring  Substance Use Topics   Alcohol use: No   Drug use: Not Currently    Types: Marijuana    Comment: more than a year ago    Allergies: No Known Allergies  Medications Prior to Admission  Medication Sig Dispense Refill Last Dose   bictegravir-emtricitabine-tenofovir AF (BIKTARVY) 50-200-25 MG TABS tablet Take 1 tablet by mouth daily. 30 tablet 5 12/31/2022   ferrous sulfate 325 (65 FE) MG tablet Take 1 tablet (325 mg total) by mouth every other day. 30 tablet 3 12/31/2022   metFORMIN (GLUCOPHAGE) 500 MG tablet Take 1 tablet (500 mg total) by mouth at bedtime. 60 tablet 2 12/31/2022   Prenatal MV & Min w/FA-DHA (PRENATAL GUMMIES PO) Take by mouth.    12/31/2022   sertraline (ZOLOFT) 50 MG tablet Take half a tablet daily x1 week then take 1 full tablet daily. 30 tablet 2 12/31/2022   valACYclovir (VALTREX) 500 MG tablet Take 1 tablet (500 mg total) by mouth 2 (two) times daily. 60 tablet 6 12/31/2022   Accu-Chek Softclix Lancets lancets Use as instructed QID 100 each 6    acetaminophen (TYLENOL) 500 MG tablet Take 2 tablets (1,000 mg total) by mouth every 8 (eight) hours as needed for moderate pain. 100 tablet 2    cefadroxil (DURICEF) 500 MG capsule Take 1 capsule (500 mg total) by mouth 2 (two) times daily. (Patient not taking: Reported on 12/31/2022) 14 capsule 0 Not Taking   glucose blood (ACCU-CHEK GUIDE) test strip Use as instructed QID 100 each 6     Review of Systems  Constitutional: Negative.   HENT: Negative.    Eyes: Negative.   Respiratory: Negative.    Cardiovascular:  Positive for chest pain.  Gastrointestinal: Negative.   Endocrine: Negative.   Genitourinary:  Positive for pelvic pain.  Musculoskeletal:  Positive for back pain.  Skin: Negative.   Allergic/Immunologic: Negative.   Neurological: Negative.   Hematological: Negative.   Psychiatric/Behavioral:  The patient is nervous/anxious.    Physical Exam   Blood pressure 102/69, pulse (!) 105, temperature 98.8 F (37.1 C), temperature source Oral, resp. rate 18, height 5' (1.524 m), weight 83.5 kg, last menstrual period 04/18/2022, SpO2 99%, not currently breastfeeding.  Physical Exam Vitals and nursing note reviewed.  Constitutional:      Appearance: Normal appearance. She is normal weight.  Cardiovascular:     Rate and Rhythm: Tachycardia present.  Pulmonary:     Effort: Pulmonary effort is normal.  Genitourinary:    Comments: deferred Musculoskeletal:        General: Normal range of motion.  Skin:    General: Skin is warm and dry.  Neurological:     Mental Status: She is alert and oriented to person, place, and time.  Psychiatric:        Mood and  Affect: Mood normal.        Behavior: Behavior normal.        Thought Content: Thought content normal.        Judgment: Judgment normal.    REACTIVE NST - FHR: 130 bpm / moderate variability / accels present / decels absent / TOCO: irregular 1 UC with  UI noted  MAU Course  Procedures  MDM Vistaril 50 mg po -- relieved anxiety/CP Excedrin Tension H/A 2 tablets -- resolved H/A  Report given to and care assumed by Emeline General, MD @ [address], CNM 12/31/2022, 6:58 PM   8:29 PM  Headache improved, anxiety better. Declines repeat cx check -- comfortable even during ctx. Stable for d/c. Return precautions given. Assessment and Plan  Acute nonintractable headache, unspecified headache type - Plan: Discharge patient Better w Excedrin Stable for d/c  Anxiety - Plan: Discharge patient Better w Atarax Stable for d/c  Braxton Hick's contraction - Plan: Discharge patient Reactive NST UI, increase hydration Labor precautions given  Sundra Aland, MD OB Fellow, Faculty Practice [facility], Center for [facility]

## 2023-01-01 ENCOUNTER — Other Ambulatory Visit: Payer: Self-pay

## 2023-01-03 ENCOUNTER — Ambulatory Visit (INDEPENDENT_AMBULATORY_CARE_PROVIDER_SITE_OTHER): Payer: MEDICAID | Admitting: Obstetrics and Gynecology

## 2023-01-03 ENCOUNTER — Ambulatory Visit: Payer: MEDICAID | Attending: Obstetrics

## 2023-01-03 ENCOUNTER — Telehealth: Payer: Self-pay | Admitting: Family Medicine

## 2023-01-03 ENCOUNTER — Encounter: Payer: Self-pay | Admitting: Obstetrics and Gynecology

## 2023-01-03 ENCOUNTER — Ambulatory Visit: Payer: MEDICAID

## 2023-01-03 ENCOUNTER — Other Ambulatory Visit: Payer: Self-pay

## 2023-01-03 VITALS — BP 113/76 | HR 95 | Wt 179.6 lb

## 2023-01-03 DIAGNOSIS — O283 Abnormal ultrasonic finding on antenatal screening of mother: Secondary | ICD-10-CM

## 2023-01-03 DIAGNOSIS — O24415 Gestational diabetes mellitus in pregnancy, controlled by oral hypoglycemic drugs: Secondary | ICD-10-CM

## 2023-01-03 DIAGNOSIS — O0990 Supervision of high risk pregnancy, unspecified, unspecified trimester: Secondary | ICD-10-CM

## 2023-01-03 DIAGNOSIS — O09893 Supervision of other high risk pregnancies, third trimester: Secondary | ICD-10-CM

## 2023-01-03 DIAGNOSIS — Z3A37 37 weeks gestation of pregnancy: Secondary | ICD-10-CM

## 2023-01-03 DIAGNOSIS — R8271 Bacteriuria: Secondary | ICD-10-CM

## 2023-01-03 DIAGNOSIS — A609 Anogenital herpesviral infection, unspecified: Secondary | ICD-10-CM

## 2023-01-03 NOTE — Telephone Encounter (Signed)
Patient was seen today in our office, she came to the front office to sign a Release of Information to get her records transferred to [facility] office, I expressed to the patient that was our sister office and they have the same system as we do and they have access to her records. She expressed to was told we had to fax her records.

## 2023-01-03 NOTE — Progress Notes (Signed)
Subjective:  Alyssa Sandoval is a 22 y.o. G3P1011 at [redacted]w[redacted]d being seen today for ongoing prenatal care.  She is currently monitored for the following issues for this high-risk pregnancy and has HSV (herpes simplex virus) anogenital infection; HIV disease (HCC); Supervision of high risk pregnancy, antepartum; Short interval between pregnancies affecting pregnancy, antepartum; HIV (human immunodeficiency virus) risk factors complicating pregnancy; Gestational diabetes; History of postpartum hemorrhage; GBS bacteriuria; and Abnormal fetal ultrasound on their problem list.  Patient reports  general discomforts of pregnancy .  Contractions: Not present. Vag. Bleeding: None.  Movement: Present. Denies leaking of fluid.   The following portions of the patient's history were reviewed and updated as appropriate: allergies, current medications, past family history, past medical history, past social history, past surgical history and problem list. Problem list updated.  Objective:   Vitals:   01/03/23 1042  BP: 113/76  Pulse: 95  Weight: 179 lb 9.6 oz (81.5 kg)    Fetal Status: Fetal Heart Rate (bpm): 134   Movement: Present     General:  Alert, oriented and cooperative. Patient is in no acute distress.  Skin: Skin is warm and dry. No rash noted.   Cardiovascular: Normal heart rate noted  Respiratory: Normal respiratory effort, no problems with respiration noted  Abdomen: Soft, gravid, appropriate for gestational age. Pain/Pressure: Present     Pelvic:  Cervical exam deferred        Extremities: Normal range of motion.  Edema: Trace  Mental Status: Normal mood and affect. Normal behavior. Normal judgment and thought content.   Urinalysis:      Assessment and Plan:  Pregnancy: G3P1011 at [redacted]w[redacted]d  1. Supervision of high risk pregnancy, antepartum Stable Labor precautions IOL scheduled  2. HIV risk factors affecting pregnancy in third trimester Stable Followed by ID Viral load 51   3.  Gestational diabetes mellitus (GDM) in third trimester controlled on oral hypoglycemic drug Did not bring CBG's today's visit IOL scheduled  4. GBS bacteriuria Tx while in labor  5. Abnormal fetal ultrasound Chest wall mass Unable to fully see on last U/S  6. HSV (herpes simplex virus) anogenital infection Continue with suppression  Term labor symptoms and general obstetric precautions including but not limited to vaginal bleeding, contractions, leaking of fluid and fetal movement were reviewed in detail with the patient. Please refer to After Visit Summary for other counseling recommendations.  Return in about 1 week (around 01/10/2023) for OB visit, face to face, MD only.   Hermina Staggers, MD

## 2023-01-06 ENCOUNTER — Encounter (HOSPITAL_COMMUNITY): Payer: Self-pay | Admitting: Obstetrics & Gynecology

## 2023-01-06 ENCOUNTER — Inpatient Hospital Stay (HOSPITAL_COMMUNITY)
Admission: AD | Admit: 2023-01-06 | Discharge: 2023-01-07 | Disposition: A | Discharge status: Home / Self Care | Payer: MEDICAID | Attending: Obstetrics & Gynecology | Admitting: Obstetrics & Gynecology

## 2023-01-06 DIAGNOSIS — Z3A37 37 weeks gestation of pregnancy: Secondary | ICD-10-CM | POA: Diagnosis not present

## 2023-01-06 DIAGNOSIS — Z0371 Encounter for suspected problem with amniotic cavity and membrane ruled out: Secondary | ICD-10-CM | POA: Diagnosis not present

## 2023-01-06 DIAGNOSIS — O471 False labor at or after 37 completed weeks of gestation: Secondary | ICD-10-CM | POA: Insufficient documentation

## 2023-01-06 LAB — URINALYSIS, ROUTINE W REFLEX MICROSCOPIC
Bilirubin Urine: NEGATIVE
Glucose, UA: NEGATIVE mg/dL
Hgb urine dipstick: NEGATIVE
Ketones, ur: NEGATIVE mg/dL
Nitrite: NEGATIVE
Protein, ur: NEGATIVE mg/dL
Specific Gravity, Urine: 1.019 (ref 1.005–1.030)
pH: 7 (ref 5.0–8.0)

## 2023-01-06 NOTE — MAU Note (Signed)
.  Alyssa Sandoval is a 22 y.o. at [redacted]w[redacted]d here in MAU reporting: ctxs that are 3-5 min apart started yesterday. Rates ctxs 10/10 lower abdominal pain.   Pt states she has noticed a steady drip of clear fluid since Friday. Pt states she is wearing a pad because it continue to drip. +Fm.   IOL for the 9th of October 2024    Vitals:   01/06/23 2200  BP: 101/88  Pulse: 85  Resp: 17  Temp: 98.3 F (36.8 C)  SpO2: 99%     FHT:125 Lab orders placed from triage:   Labor eval. / fern slide.

## 2023-01-07 DIAGNOSIS — Z3A37 37 weeks gestation of pregnancy: Secondary | ICD-10-CM

## 2023-01-07 DIAGNOSIS — Z0371 Encounter for suspected problem with amniotic cavity and membrane ruled out: Secondary | ICD-10-CM | POA: Diagnosis not present

## 2023-01-07 LAB — RUPTURE OF MEMBRANE (ROM)PLUS: Rom Plus: NEGATIVE

## 2023-01-07 LAB — POCT FERN TEST: POCT Fern Test: NEGATIVE

## 2023-01-07 NOTE — MAU Provider Note (Signed)
History     CSN: [id number]  Arrival date and time: 01/06/23 2108  Provider's first contact with patient was at 0140  Chief Complaint  Patient presents with   Contractions   Rupture of Membranes   HPI [name] is a 22 y.o. year old G19P1011 female at [redacted]w[redacted]d weeks gestation who presents to MAU reporting contractions every 3 to 5 minutes that started yesterday she rates the contractions a 10 out of 10.  She also complains of a steady drip of clear fluid that has been occurring since Friday, January 04, 2023.  She states that she has had to wear a pad because of the continuous drip.  She reports positive fetal movement.She receives [facility] with [facility]; next appt is 01/09/2023 at [facility]. Her grandmother is present and contributing to the history taking.    OB History     Gravida  3   Para  1   Term  1   Preterm  0   AB  1   Living  1      SAB  1   IAB  0   Ectopic  0   Multiple  0   Live Births  1           Past Medical History:  Diagnosis Date   Anemia    Angio-edema    Chronic constipation 06/10/2012   COVID-19 04/2020   Depression    Diabetes mellitus without complication (HCC)    Dysfunctional uterine bleeding    GERD (gastroesophageal reflux disease) 06/10/2012   Gestational diabetes    Headache 12/09/2020   Headache 12/09/2020   High-risk pregnancy 07/13/2021   HIV (human immunodeficiency virus infection) (HCC)    HIV (human immunodeficiency virus infection) (HCC)    HIV disease (HCC) 11/01/2020   -on Biktarvy, last viral load: 07/13/21: 12,000  12/19/21: not detected   EFW @ 28, 36wks     No testing        C/S @ 38wks (VL >1K) or IOL @ 39wks (VL <1K)   HSV (herpes simplex virus) anogenital infection    Low back pain 12/09/2020   Low back pain 12/09/2020   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   Passive suicidal ideations 11/07/2020   Passive suicidal ideations 11/07/2020   Polyhydramnios 02/13/2022   Postpartum hemorrhage 02/16/2022    Pregnancy induced hypertension    Pyelonephritis 08/19/2021   Admission 5/13-5/15   Urine cx poc 09/11/21 neg   Urticaria    Vaginal delivery 02/16/2022   Vaginal pruritus 03/15/2021   Vaginal yeast infection     Past Surgical History:  Procedure Laterality Date   NO PAST SURGERIES      Family History  Problem Relation Age of Onset   Diabetes Mother    Asthma Mother    Obesity Mother    Fibromyalgia Mother    Mental illness Mother    Other Father        not to her knowledge, re med problems   Diabetes Maternal Uncle    Asthma Maternal Grandmother    Hypertension Maternal Grandmother    Cancer Maternal Grandfather    Colon cancer Maternal Grandfather    Cancer Other    Heart disease Neg Hx     Social History   Tobacco Use   Smoking status: Former    Types: Cigars, E-cigarettes   Smokeless tobacco: Never   Tobacco comments:    vapes  Vaping Use   Vaping status: Former   Substances:  Nicotine, CBD, Flavoring  Substance Use Topics   Alcohol use: No   Drug use: Not Currently    Types: Marijuana    Comment: more than a year ago    Allergies: No Known Allergies  Medications Prior to Admission  Medication Sig Dispense Refill Last Dose   bictegravir-emtricitabine-tenofovir AF (BIKTARVY) 50-200-25 MG TABS tablet Take 1 tablet by mouth daily. 30 tablet 5 01/05/2023   hydrOXYzine (ATARAX) 25 MG tablet Take 1 tablet (25 mg total) by mouth every 8 (eight) hours as needed for anxiety. Do not take prior to operating heavy machinery -- may cause drowsiness. 30 tablet 0 01/05/2023   metFORMIN (GLUCOPHAGE) 500 MG tablet Take 1 tablet (500 mg total) by mouth at bedtime. 60 tablet 2 01/05/2023   Prenatal MV & Min w/FA-DHA (PRENATAL GUMMIES PO) Take by mouth.   01/05/2023   sertraline (ZOLOFT) 50 MG tablet Take half a tablet daily x1 week then take 1 full tablet daily. 30 tablet 2 01/05/2023   valACYclovir (VALTREX) 500 MG tablet Take 1 tablet (500 mg total) by mouth 2 (two) times  daily. 60 tablet 6 01/05/2023   Accu-Chek Softclix Lancets lancets Use as instructed QID 100 each 6    acetaminophen (TYLENOL) 500 MG tablet Take 2 tablets (1,000 mg total) by mouth every 8 (eight) hours as needed for moderate pain. 100 tablet 2    ferrous sulfate 325 (65 FE) MG tablet Take 1 tablet (325 mg total) by mouth every other day. 30 tablet 3    glucose blood (ACCU-CHEK GUIDE) test strip Use as instructed QID 100 each 6     Review of Systems  Constitutional: Negative.   HENT: Negative.    Eyes: Negative.   Respiratory: Negative.    Cardiovascular: Negative.   Gastrointestinal: Negative.   Endocrine: Negative.   Genitourinary:  Positive for pelvic pain and vaginal discharge. Menstrual problem: contractions every 3-5 mins. Musculoskeletal: Negative.   Skin: Negative.   Allergic/Immunologic: Negative.   Neurological: Negative.   Hematological: Negative.   Psychiatric/Behavioral: Negative.     Physical Exam   Blood pressure 101/88, pulse 85, temperature 98.3 F (36.8 C), temperature source Oral, resp. rate 17, last menstrual period 04/18/2022, SpO2 99%, not currently breastfeeding.  Physical Exam Constitutional:      Appearance: Normal appearance.  Genitourinary:    Comments: Dilation: 2.5 Effacement (%): 60 Cervical Position: Posterior Station: -3 Presentation: Vertex (CNM felt intact Membranes.) Exam by:: Raelyn Mora, CNM  Vulvar edema noted Neurological:     Mental Status: She is alert.    REACTIVE NST - FHR: 120 bpm / moderate variability / accels present / decels absent / TOCO: irregular UC's with UI noted   MAU Course  Procedures  MDM EFM Fern Slide - 1 fern seen with multiple urine crystals seen throughout the slide ROM Plus  Results for orders placed or performed during the hospital encounter of 01/06/23 (from the past 24 hour(s))  Urinalysis, Routine w reflex microscopic -Urine, Clean Catch     Status: Abnormal   Collection Time: 01/06/23 10:27  PM  Result Value Ref Range   Color, Urine YELLOW YELLOW   APPearance CLOUDY (A) CLEAR   Specific Gravity, Urine 1.019 1.005 - 1.030   pH 7.0 5.0 - 8.0   Glucose, UA NEGATIVE NEGATIVE mg/dL   Hgb urine dipstick NEGATIVE NEGATIVE   Bilirubin Urine NEGATIVE NEGATIVE   Ketones, ur NEGATIVE NEGATIVE mg/dL   Protein, ur NEGATIVE NEGATIVE mg/dL   Nitrite NEGATIVE NEGATIVE  Leukocytes,Ua LARGE (A) NEGATIVE   RBC / HPF 6-10 0 - 5 RBC/hpf   WBC, UA 21-50 0 - 5 WBC/hpf   Bacteria, UA MANY (A) NONE SEEN   Squamous Epithelial / HPF 6-10 0 - 5 /HPF   Mucus PRESENT   Rupture of Membrane (ROM) Plus     Status: None   Collection Time: 01/06/23 11:42 PM  Result Value Ref Range   Rom Plus NEGATIVE   Fern Test     Status: Normal   Collection Time: 01/07/23 12:23 AM  Result Value Ref Range   POCT Fern Test Negative = intact amniotic membranes      Assessment and Plan  1. No leakage of amniotic fluid into vagina - Advised that the fern slide had 1 fern on it which is more than likely from cervical mucus.  All that was seen on the slide was dried urine crystals consistent with leakage of urine. - Patient reports positive leakage of urine at the time of the collection of the fern slide  2. [redacted] weeks gestation of pregnancy   - Discharge patient - Keep scheduled appointment with [facility] on January 09, 2023 - Patient verbalized an understanding of the plan of care and agrees.  Raelyn Mora, CNM 01/07/2023, 1:53 AM

## 2023-01-08 ENCOUNTER — Other Ambulatory Visit: Payer: Self-pay

## 2023-01-08 ENCOUNTER — Encounter (HOSPITAL_COMMUNITY): Payer: Self-pay | Admitting: Obstetrics and Gynecology

## 2023-01-08 ENCOUNTER — Inpatient Hospital Stay (HOSPITAL_COMMUNITY)
Admission: AD | Admit: 2023-01-08 | Discharge: 2023-01-09 | Disposition: A | Discharge status: Home / Self Care | Payer: MEDICAID | Attending: Obstetrics and Gynecology | Admitting: Obstetrics and Gynecology

## 2023-01-08 ENCOUNTER — Inpatient Hospital Stay (HOSPITAL_COMMUNITY): Payer: MEDICAID

## 2023-01-08 DIAGNOSIS — O26613 Liver and biliary tract disorders in pregnancy, third trimester: Secondary | ICD-10-CM | POA: Insufficient documentation

## 2023-01-08 DIAGNOSIS — R1010 Upper abdominal pain, unspecified: Secondary | ICD-10-CM

## 2023-01-08 DIAGNOSIS — Z3A38 38 weeks gestation of pregnancy: Secondary | ICD-10-CM | POA: Diagnosis not present

## 2023-01-08 DIAGNOSIS — O26893 Other specified pregnancy related conditions, third trimester: Secondary | ICD-10-CM | POA: Insufficient documentation

## 2023-01-08 DIAGNOSIS — M549 Dorsalgia, unspecified: Secondary | ICD-10-CM

## 2023-01-08 DIAGNOSIS — M546 Pain in thoracic spine: Secondary | ICD-10-CM | POA: Insufficient documentation

## 2023-01-08 DIAGNOSIS — R1012 Left upper quadrant pain: Secondary | ICD-10-CM | POA: Diagnosis present

## 2023-01-08 DIAGNOSIS — K8020 Calculus of gallbladder without cholecystitis without obstruction: Secondary | ICD-10-CM | POA: Diagnosis not present

## 2023-01-08 LAB — URINALYSIS, ROUTINE W REFLEX MICROSCOPIC
Bacteria, UA: NONE SEEN
Bilirubin Urine: NEGATIVE
Glucose, UA: NEGATIVE mg/dL
Hgb urine dipstick: NEGATIVE
Ketones, ur: 20 mg/dL — AB
Nitrite: NEGATIVE
Protein, ur: NEGATIVE mg/dL
Specific Gravity, Urine: 1.02 (ref 1.005–1.030)
pH: 6 (ref 5.0–8.0)

## 2023-01-08 LAB — RUPTURE OF MEMBRANE (ROM)PLUS: Rom Plus: NEGATIVE

## 2023-01-08 LAB — POCT FERN TEST: POCT Fern Test: NEGATIVE

## 2023-01-08 NOTE — MAU Provider Note (Signed)
Chief Complaint:  Abdominal Pain, Back Pain, Decreased Fetal Movement, and Vaginal Discharge   Event Date/Time   First Provider Initiated Contact with Patient 01/08/23 2301     HPI: Alyssa Sandoval is a 22 y.o. G3P1011 at 54w6dwho presents to maternity admissions reporting pain in upper abdomen and upper mid back.  Flexeril did not help.  Also continue to leak watery fluid, leaves a wet spot on bed.  Scheduled for IOL next week. . She denies vaginal bleeding, urinary symptoms, h/a, n/v, diarrhea, constipation or fever/chills.    Abdominal Pain This is a recurrent problem. The problem occurs constantly. The pain is located in the RLQ, LUQ and epigastric region. The abdominal pain radiates to the back. Pertinent negatives include no diarrhea, dysuria, fever, frequency, myalgias, nausea or vomiting. The pain is aggravated by palpation. The pain is relieved by Nothing.   RN Note: .Alyssa Sandoval is a 22 y.o. at [redacted]w[redacted]d here in MAU reporting: upper abdominal pain and mid back pain - describes both as stabbing and constant. States abdomen is tender. Denies VB. Reports she was seen here for LOF 2 days ago - was told she was not ruptured but reports she has continued to leak clear/yellow fluid. Reports DFM - states she has felt baby move day, it's just less than normal. States she took flexeril at 1900. Last intercourse 3-4 days ago. Also reporting HA   Onset of complaint: 1100 Pain score: 8 - abdomen; 10 - back; HA - 8  Past Medical History: Past Medical History:  Diagnosis Date   Anemia    Angio-edema    Chronic constipation 06/10/2012   COVID-19 04/2020   Depression    Diabetes mellitus without complication (HCC)    Dysfunctional uterine bleeding    GERD (gastroesophageal reflux disease) 06/10/2012   Gestational diabetes    Headache 12/09/2020   Headache 12/09/2020   High-risk pregnancy 07/13/2021   HIV (human immunodeficiency virus infection) (HCC)    HIV (human immunodeficiency  virus infection) (HCC)    HIV disease (HCC) 11/01/2020   -on Biktarvy, last viral load: 07/13/21: 12,000  12/19/21: not detected   EFW @ 28, 36wks     No testing        C/S @ 38wks (VL >1K) or IOL @ 39wks (VL <1K)   HSV (herpes simplex virus) anogenital infection    Low back pain 12/09/2020   Low back pain 12/09/2020   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   Passive suicidal ideations 11/07/2020   Passive suicidal ideations 11/07/2020   Polyhydramnios 02/13/2022   Postpartum hemorrhage 02/16/2022   Pregnancy induced hypertension    Pyelonephritis 08/19/2021   Admission 5/13-5/15   Urine cx poc 09/11/21 neg   Urticaria    Vaginal delivery 02/16/2022   Vaginal pruritus 03/15/2021   Vaginal yeast infection     Past obstetric history: OB History  Gravida Para Term Preterm AB Living  3 1 1  0 1 1  SAB IAB Ectopic Multiple Live Births  1 0 0 0 1    # Outcome Date GA Lbr Len/2nd Weight Sex Type Anes PTL Lv  3 Current           2 Term 02/14/22 [redacted]w[redacted]d 04:49 / 03:39 3220 g M Vag-Spont EPI  LIV  1 SAB 2020            Past Surgical History: Past Surgical History:  Procedure Laterality Date   NO PAST SURGERIES      Family History: Family History  Problem Relation Age of Onset   Diabetes Mother    Asthma Mother    Obesity Mother    Fibromyalgia Mother    Mental illness Mother    Other Father        not to her knowledge, re med problems   Diabetes Maternal Uncle    Asthma Maternal Grandmother    Hypertension Maternal Grandmother    Cancer Maternal Grandfather    Colon cancer Maternal Grandfather    Cancer Other    Heart disease Neg Hx     Social History: Social History   Tobacco Use   Smoking status: Former    Types: Software engineer, E-cigarettes   Smokeless tobacco: Never   Tobacco comments:    vapes  Vaping Use   Vaping status: Former   Substances: Nicotine, CBD, Flavoring  Substance Use Topics   Alcohol use: No   Drug use: Not Currently    Types: Marijuana    Comment: more than  a year ago    Allergies: No Known Allergies  Meds:  Medications Prior to Admission  Medication Sig Dispense Refill Last Dose   bictegravir-emtricitabine-tenofovir AF (BIKTARVY) 50-200-25 MG TABS tablet Take 1 tablet by mouth daily. 30 tablet 5 01/08/2023   cyclobenzaprine (FLEXERIL) 10 MG tablet Take 10 mg by mouth 3 (three) times daily as needed for muscle spasms.   01/08/2023 at 1900   ferrous sulfate 325 (65 FE) MG tablet Take 1 tablet (325 mg total) by mouth every other day. 30 tablet 3 01/07/2023   hydrOXYzine (ATARAX) 25 MG tablet Take 1 tablet (25 mg total) by mouth every 8 (eight) hours as needed for anxiety. Do not take prior to operating heavy machinery -- may cause drowsiness. 30 tablet 0 01/07/2023   metFORMIN (GLUCOPHAGE) 500 MG tablet Take 1 tablet (500 mg total) by mouth at bedtime. 60 tablet 2 01/08/2023   Prenatal MV & Min w/FA-DHA (PRENATAL GUMMIES PO) Take by mouth.   01/07/2023   sertraline (ZOLOFT) 50 MG tablet Take half a tablet daily x1 week then take 1 full tablet daily. 30 tablet 2 01/07/2023   valACYclovir (VALTREX) 500 MG tablet Take 1 tablet (500 mg total) by mouth 2 (two) times daily. 60 tablet 6 01/07/2023   Accu-Chek Softclix Lancets lancets Use as instructed QID 100 each 6    acetaminophen (TYLENOL) 500 MG tablet Take 2 tablets (1,000 mg total) by mouth every 8 (eight) hours as needed for moderate pain. 100 tablet 2    glucose blood (ACCU-CHEK GUIDE) test strip Use as instructed QID 100 each 6     I have reviewed patient's Past Medical Hx, Surgical Hx, Family Hx, Social Hx, medications and allergies.   ROS:  Review of Systems  Constitutional:  Negative for fever.  Gastrointestinal:  Positive for abdominal pain. Negative for diarrhea, nausea and vomiting.  Genitourinary:  Negative for dysuria and frequency.  Musculoskeletal:  Negative for myalgias.   Other systems negative  Physical Exam  Patient Vitals for the past 24 hrs:  BP Temp Temp src Pulse Resp SpO2   01/08/23 2245 120/70 98.2 F (36.8 C) Oral (!) 113 19 98 %   Constitutional: Well-developed, well-nourished female in no acute distress.  Cardiovascular: normal rate and rhythm Respiratory: normal effort GI: Abd soft, tender over upper abdomen, mild guarding, gravid appropriate for gestational age.   No rebound. MS: Extremities nontender, no edema, normal ROM Neurologic: Alert and oriented x 4.  GU: Neg CVAT.  PELVIC EXAM: ROM + collected  FHT:  Baseline 140 , moderate variability, accelerations present, no decelerations Contractions:  Irregular     Labs: Results for orders placed or performed during the hospital encounter of 01/08/23 (from the past 24 hour(s))  Urinalysis, Routine w reflex microscopic -Urine, Clean Catch     Status: Abnormal   Collection Time: 01/08/23 10:43 PM  Result Value Ref Range   Color, Urine YELLOW YELLOW   APPearance CLEAR CLEAR   Specific Gravity, Urine 1.020 1.005 - 1.030   pH 6.0 5.0 - 8.0   Glucose, UA NEGATIVE NEGATIVE mg/dL   Hgb urine dipstick NEGATIVE NEGATIVE   Bilirubin Urine NEGATIVE NEGATIVE   Ketones, ur 20 (A) NEGATIVE mg/dL   Protein, ur NEGATIVE NEGATIVE mg/dL   Nitrite NEGATIVE NEGATIVE   Leukocytes,Ua SMALL (A) NEGATIVE   RBC / HPF 0-5 0 - 5 RBC/hpf   WBC, UA 0-5 0 - 5 WBC/hpf   Bacteria, UA NONE SEEN NONE SEEN   Squamous Epithelial / HPF 0-5 0 - 5 /HPF   Mucus PRESENT   Fern Test     Status: None   Collection Time: 01/08/23 11:09 PM  Result Value Ref Range   POCT Fern Test Negative = intact amniotic membranes   Rupture of Membrane (ROM) Plus     Status: None   Collection Time: 01/08/23 11:16 PM  Result Value Ref Range   Rom Plus NEGATIVE   CBC     Status: Abnormal   Collection Time: 01/08/23 11:49 PM  Result Value Ref Range   WBC 10.3 4.0 - 10.5 K/uL   RBC 3.85 (L) 3.87 - 5.11 MIL/uL   Hemoglobin 9.1 (L) 12.0 - 15.0 g/dL   HCT 73.2 (L) 20.2 - 54.2 %   MCV 76.1 (L) 80.0 - 100.0 fL   MCH 23.6 (L) 26.0 - 34.0 pg    MCHC 31.1 30.0 - 36.0 g/dL   RDW 70.6 23.7 - 62.8 %   Platelets 327 150 - 400 K/uL   nRBC 0.2 0.0 - 0.2 %  Comprehensive metabolic panel     Status: Abnormal   Collection Time: 01/08/23 11:49 PM  Result Value Ref Range   Sodium 136 135 - 145 mmol/L   Potassium 3.8 3.5 - 5.1 mmol/L   Chloride 108 98 - 111 mmol/L   CO2 21 (L) 22 - 32 mmol/L   Glucose, Bld 114 (H) 70 - 99 mg/dL   BUN 6 6 - 20 mg/dL   Creatinine, Ser 3.15 0.44 - 1.00 mg/dL   Calcium 8.6 (L) 8.9 - 10.3 mg/dL   Total Protein 5.8 (L) 6.5 - 8.1 g/dL   Albumin 2.7 (L) 3.5 - 5.0 g/dL   AST 17 15 - 41 U/L   ALT 15 0 - 44 U/L   Alkaline Phosphatase 132 (H) 38 - 126 U/L   Total Bilirubin 0.1 (L) 0.3 - 1.2 mg/dL   GFR, Estimated >17 >61 mL/min   Anion gap 7 5 - 15    --/--/AB POS (09/06 1810)  Imaging:  US ABDOMEN LIMITED RUQ (LIVER/GB)  Result Date: 01/09/2023 CLINICAL DATA:  32 weeks and 6 days pregnant, presenting with upper abdominal pain. EXAM: ULTRASOUND ABDOMEN LIMITED RIGHT UPPER QUADRANT COMPARISON:  None Available. FINDINGS: Gallbladder: Small, shadowing echogenic gallstones are seen within the gallbladder lumen. The largest measures approximately 2.2 mm. There is no evidence of gallbladder wall thickening (2.8 mm). No sonographic Murphy sign noted by sonographer. Common bile duct: Diameter: 2.1 mm Liver: No focal lesion identified. Within normal  limits in parenchymal echogenicity. Portal vein is patent on color Doppler imaging with normal direction of blood flow towards the liver. Other: None. IMPRESSION: Cholelithiasis, without evidence of acute cholecystitis. Electronically Signed   By: Aram Candela M.D.   On: 01/09/2023 00:15     MAU Course/MDM: I have reviewed the triage vital signs and the nursing notes.   Pertinent labs & imaging results that were available during my care of the patient were reviewed by me and considered in my medical decision making (see chart for details).      I have reviewed her  medical records including past results, notes and treatments.   I have ordered labs and reviewed results. Normal transaminases and WBC US showed gallstones without cholecystitis. NST reviewed Consult Dr Jolayne Panther with presentation, exam findings and test results.  Treatments in MAU included Percocet and Phenergan which did help pain.    Assessment: Single IUP at [redacted]w[redacted]d Upper abdominal and back pain Cholelithiasis without cholecystitis  Plan: Discharge home Labor precautions and fetal kick counts Rx Percocet and Phenergan for gallstones Follow up in Office for prenatal visits and recheck Encouraged to return if she develops worsening of symptoms, increase in pain, fever, or other concerning symptoms.   Pt stable at time of discharge.  Wynelle Bourgeois CNM, MSN Certified Nurse-Midwife 01/08/2023 11:01 PM

## 2023-01-08 NOTE — MAU Note (Addendum)
.  Alyssa Sandoval is a 22 y.o. at [redacted]w[redacted]d here in MAU reporting: upper abdominal pain and mid back pain - describes both as stabbing and constant. States abdomen is tender. Denies VB. Reports she was seen here for LOF 2 days ago - was told she was not ruptured but reports she has continued to leak clear/yellow fluid. Reports DFM - states she has felt baby move day, it's just less than normal. States she took flexeril at 1900. Last intercourse 3-4 days ago. Also reporting HA   Onset of complaint: 1100 Pain score: 8 - abdomen; 10 - back; HA - 8 Vitals:   01/08/23 2245  BP: 120/70  Pulse: (!) 113  Resp: 19  Temp: 98.2 F (36.8 C)  SpO2: 98%     FHT:141 Lab orders placed from triage:  UA; fern

## 2023-01-09 ENCOUNTER — Ambulatory Visit (INDEPENDENT_AMBULATORY_CARE_PROVIDER_SITE_OTHER): Payer: MEDICAID | Admitting: Women's Health

## 2023-01-09 ENCOUNTER — Encounter: Payer: Self-pay | Admitting: Advanced Practice Midwife

## 2023-01-09 ENCOUNTER — Telehealth (HOSPITAL_COMMUNITY): Payer: Self-pay | Admitting: *Deleted

## 2023-01-09 ENCOUNTER — Encounter (HOSPITAL_COMMUNITY): Payer: Self-pay | Admitting: *Deleted

## 2023-01-09 ENCOUNTER — Encounter: Payer: Self-pay | Admitting: Women's Health

## 2023-01-09 VITALS — BP 121/81 | HR 93 | Wt 180.0 lb

## 2023-01-09 DIAGNOSIS — O99891 Other specified diseases and conditions complicating pregnancy: Secondary | ICD-10-CM | POA: Diagnosis not present

## 2023-01-09 DIAGNOSIS — O26613 Liver and biliary tract disorders in pregnancy, third trimester: Secondary | ICD-10-CM

## 2023-01-09 DIAGNOSIS — M549 Dorsalgia, unspecified: Secondary | ICD-10-CM

## 2023-01-09 DIAGNOSIS — Z3A38 38 weeks gestation of pregnancy: Secondary | ICD-10-CM

## 2023-01-09 DIAGNOSIS — K8020 Calculus of gallbladder without cholecystitis without obstruction: Secondary | ICD-10-CM

## 2023-01-09 DIAGNOSIS — R1010 Upper abdominal pain, unspecified: Secondary | ICD-10-CM

## 2023-01-09 DIAGNOSIS — Z87440 Personal history of urinary (tract) infections: Secondary | ICD-10-CM

## 2023-01-09 DIAGNOSIS — O24415 Gestational diabetes mellitus in pregnancy, controlled by oral hypoglycemic drugs: Secondary | ICD-10-CM

## 2023-01-09 DIAGNOSIS — O0993 Supervision of high risk pregnancy, unspecified, third trimester: Secondary | ICD-10-CM

## 2023-01-09 DIAGNOSIS — O0990 Supervision of high risk pregnancy, unspecified, unspecified trimester: Secondary | ICD-10-CM

## 2023-01-09 DIAGNOSIS — O24419 Gestational diabetes mellitus in pregnancy, unspecified control: Secondary | ICD-10-CM

## 2023-01-09 DIAGNOSIS — O283 Abnormal ultrasonic finding on antenatal screening of mother: Secondary | ICD-10-CM

## 2023-01-09 LAB — COMPREHENSIVE METABOLIC PANEL
ALT: 15 U/L (ref 0–44)
AST: 17 U/L (ref 15–41)
Albumin: 2.7 g/dL — ABNORMAL LOW (ref 3.5–5.0)
Alkaline Phosphatase: 132 U/L — ABNORMAL HIGH (ref 38–126)
Anion gap: 7 (ref 5–15)
BUN: 6 mg/dL (ref 6–20)
CO2: 21 mmol/L — ABNORMAL LOW (ref 22–32)
Calcium: 8.6 mg/dL — ABNORMAL LOW (ref 8.9–10.3)
Chloride: 108 mmol/L (ref 98–111)
Creatinine, Ser: 0.5 mg/dL (ref 0.44–1.00)
GFR, Estimated: >60 mL/min (ref >60)
Glucose, Bld: 114 mg/dL — ABNORMAL HIGH (ref 70–99)
Potassium: 3.8 mmol/L (ref 3.5–5.1)
Sodium: 136 mmol/L (ref 135–145)
Total Bilirubin: 0.1 mg/dL — ABNORMAL LOW (ref 0.3–1.2)
Total Protein: 5.8 g/dL — ABNORMAL LOW (ref 6.5–8.1)

## 2023-01-09 LAB — CBC
HCT: 29.3 % — ABNORMAL LOW (ref 36.0–46.0)
Hemoglobin: 9.1 g/dL — ABNORMAL LOW (ref 12.0–15.0)
MCH: 23.6 pg — ABNORMAL LOW (ref 26.0–34.0)
MCHC: 31.1 g/dL (ref 30.0–36.0)
MCV: 76.1 fL — ABNORMAL LOW (ref 80.0–100.0)
Platelets: 327 10*3/uL (ref 150–400)
RBC: 3.85 MIL/uL — ABNORMAL LOW (ref 3.87–5.11)
RDW: 15.1 % (ref 11.5–15.5)
WBC: 10.3 10*3/uL (ref 4.0–10.5)
nRBC: 0.2 % (ref 0.0–0.2)

## 2023-01-09 MED ORDER — METFORMIN HCL 500 MG PO TABS
ORAL_TABLET | ORAL | 0 refills | Status: DC
Start: 2023-01-09 — End: 2023-01-12

## 2023-01-09 MED ORDER — OXYCODONE-ACETAMINOPHEN 5-325 MG PO TABS
1.0000 | ORAL_TABLET | Freq: Once | ORAL | Status: AC
  Administered 2023-01-09: 1 via ORAL
  Filled 2023-01-09: qty 1

## 2023-01-09 MED ORDER — OXYCODONE-ACETAMINOPHEN 5-325 MG PO TABS
1.0000 | ORAL_TABLET | Freq: Four times a day (QID) | ORAL | 0 refills | Status: DC | PRN
Start: 2023-01-09 — End: 2023-01-12

## 2023-01-09 MED ORDER — PROMETHAZINE HCL 25 MG PO TABS: 25.0000 mg | ORAL_TABLET | Freq: Four times a day (QID) | ORAL | 2 refills | Status: DC | PRN

## 2023-01-09 MED ORDER — PROMETHAZINE HCL 25 MG PO TABS
25.0000 mg | ORAL_TABLET | Freq: Once | ORAL | Status: AC
  Administered 2023-01-09: 25 mg via ORAL
  Filled 2023-01-09: qty 1

## 2023-01-09 NOTE — Progress Notes (Addendum)
HIGH-RISK PREGNANCY VISIT Patient name: Alyssa Sandoval MRN [id number]  Date of birth: Apr 28, 2000 Chief Complaint:   Routine Prenatal Visit (Wants induction sooner)  History of Present Illness:   Alyssa Sandoval is a 22 y.o. G28P1011 female at [redacted]w[redacted]d with an Estimated Date of Delivery: 01/23/23 being seen today for ongoing management of a high-risk pregnancy complicated by diabetes mellitus A2DM currently on metformin 500mg  qhs  and HIV.  1st visit w/ [place], transferring from [facility]  Today she reports  wants IOL now (currently scheduled for 10/9 at 39w), went to MAU last night, has gallstones, taking percocet. FBS 94-100 (4 of last 7 elevated per report), 2hr pp 124-129.  Marland Kitchen Contractions: Irregular. Vag. Bleeding: None.  Movement: Present. denies leaking of fluid.      12/14/2022   11:10 AM 08/29/2022    3:16 PM 07/23/2022   10:06 AM 01/24/2022    9:51 AM 12/19/2021    9:46 AM  Depression screen PHQ 2/9  Decreased Interest 0 0 0 3 0  Down, Depressed, Hopeless 0 0 0 0 0  PHQ - 2 Score 0 0 0 3 0  Altered sleeping   3 3   Tired, decreased energy   3 3   Change in appetite   0 0   Feeling bad or failure about yourself    0 0   Trouble concentrating   0 0   Moving slowly or fidgety/restless   0 0   Suicidal thoughts   0 0   PHQ-9 Score   6 9         07/23/2022   10:07 AM 01/24/2022    9:52 AM 08/09/2021    3:56 PM 10/19/2020    2:11 PM  GAD 7 : Generalized Anxiety Score  Nervous, Anxious, on Edge 1 0 1 3  Control/stop worrying 0 0 0 3  Worry too much - different things 0 0 1 3  Trouble relaxing 1 0 0 3  Restless 0 0 0 0  Easily annoyed or irritable 0 0 0 3  Afraid - awful might happen 0 0 0 3  Total GAD 7 Score 2 0 2 18     Review of Systems:   Pertinent items are noted in HPI Denies abnormal vaginal discharge w/ itching/odor/irritation, headaches, visual changes, shortness of breath, chest pain, abdominal pain, severe nausea/vomiting, or problems with urination or bowel  movements unless otherwise stated above. Pertinent History Reviewed:  Reviewed past medical,surgical, social, obstetrical and family history.  Reviewed problem list, medications and allergies. Physical Assessment:   Vitals:   01/09/23 1159  BP: 121/81  Pulse: 93  Weight: 180 lb (81.6 kg)  Body mass index is 35.15 kg/m.           Physical Examination:   General appearance: alert, well appearing, and in no distress  Mental status: alert, oriented to person, place, and time  Skin: warm & dry   Extremities: Edema: None    Cardiovascular: normal heart rate noted  Respiratory: normal respiratory effort, no distress  Abdomen: gravid, soft, non-tender  Pelvic: Cervical exam deferred         Fetal Status: Fetal Heart Rate (bpm): 150 Fundal Height: 38 cm Movement: Present    Fetal Surveillance Testing today: doppler (had NST last night)  Chaperone: N/A    Results for orders placed or performed during the hospital encounter of 01/08/23 (from the past 24 hour(s))  Urinalysis, Routine w reflex microscopic -Urine, Clean Catch  Collection Time: 01/08/23 10:43 PM  Result Value Ref Range   Color, Urine YELLOW YELLOW   APPearance CLEAR CLEAR   Specific Gravity, Urine 1.020 1.005 - 1.030   pH 6.0 5.0 - 8.0   Glucose, UA NEGATIVE NEGATIVE mg/dL   Hgb urine dipstick NEGATIVE NEGATIVE   Bilirubin Urine NEGATIVE NEGATIVE   Ketones, ur 20 (A) NEGATIVE mg/dL   Protein, ur NEGATIVE NEGATIVE mg/dL   Nitrite NEGATIVE NEGATIVE   Leukocytes,Ua SMALL (A) NEGATIVE   RBC / HPF 0-5 0 - 5 RBC/hpf   WBC, UA 0-5 0 - 5 WBC/hpf   Bacteria, UA NONE SEEN NONE SEEN   Squamous Epithelial / HPF 0-5 0 - 5 /HPF   Mucus PRESENT   Fern Test   Collection Time: 01/08/23 11:09 PM  Result Value Ref Range   POCT Fern Test Negative = intact amniotic membranes   Rupture of Membrane (ROM) Plus   Collection Time: 01/08/23 11:16 PM  Result Value Ref Range   Rom Plus NEGATIVE   CBC   Collection Time: 01/08/23 11:49  PM  Result Value Ref Range   WBC 10.3 4.0 - 10.5 K/uL   RBC 3.85 (L) 3.87 - 5.11 MIL/uL   Hemoglobin 9.1 (L) 12.0 - 15.0 g/dL   HCT 25.4 (L) 27.0 - 62.3 %   MCV 76.1 (L) 80.0 - 100.0 fL   MCH 23.6 (L) 26.0 - 34.0 pg   MCHC 31.1 30.0 - 36.0 g/dL   RDW 76.2 83.1 - 51.7 %   Platelets 327 150 - 400 K/uL   nRBC 0.2 0.0 - 0.2 %  Comprehensive metabolic panel   Collection Time: 01/08/23 11:49 PM  Result Value Ref Range   Sodium 136 135 - 145 mmol/L   Potassium 3.8 3.5 - 5.1 mmol/L   Chloride 108 98 - 111 mmol/L   CO2 21 (L) 22 - 32 mmol/L   Glucose, Bld 114 (H) 70 - 99 mg/dL   BUN 6 6 - 20 mg/dL   Creatinine, Ser 6.16 0.44 - 1.00 mg/dL   Calcium 8.6 (L) 8.9 - 10.3 mg/dL   Total Protein 5.8 (L) 6.5 - 8.1 g/dL   Albumin 2.7 (L) 3.5 - 5.0 g/dL   AST 17 15 - 41 U/L   ALT 15 0 - 44 U/L   Alkaline Phosphatase 132 (H) 38 - 126 U/L   Total Bilirubin 0.1 (L) 0.3 - 1.2 mg/dL   GFR, Estimated >07 >37 mL/min   Anion gap 7 5 - 15    Assessment & Plan:  High-risk pregnancy: G3P1011 at [redacted]w[redacted]d with an Estimated Date of Delivery: 01/23/23   1) HIV, on meds, last VL 51 on 9/6, states she is taking meds daily,  IOL @ >=39wks (VL <1K)   2) A2DM, metformin 500mg  AM/1000mg  PM  3) Cholelithiasis> has percocet rx, referral to general surgery ordered  4) Recent UTI> urine cx poc today  Meds:  Meds ordered this encounter  Medications   metFORMIN (GLUCOPHAGE) 500 MG tablet    Sig: Take 500mg  (1 tablet) in the morning and 1000mg  (2 tablets at bedtime)    Dispense:  30 tablet    Refill:  0    Labs/procedures today: none  Treatment Plan:  discussed all/pt wanting IOL now w/ Dr. Charlotta Newton, no reason for earlier IOL, keep as scheduled 10/9. Continue 2x/wk testing   Reviewed: Term labor symptoms and general obstetric precautions including but not limited to vaginal bleeding, contractions, leaking of fluid and fetal movement  were reviewed in detail with the patient.  All questions were answered. Does have  home bp cuff. Office bp cuff given: not applicable. Check bp weekly, let us know if consistently >140 and/or >90.  Follow-up: Return for frid nst/nurse, Felton Clinton MD.   Future Appointments  Date Time Provider Department Center  01/11/2023 11:10 AM [facility] NURSE [facility]  01/15/2023  9:10 AM Lazaro Arms, MD [facility]  01/16/2023 12:00 AM [facility] SCHED ROOM [facility] None  05/02/2023 10:30 AM Veryl Speak, FNP [facility]  05/03/2023  3:30 PM Alfonse Spruce, MD [facility] None    Orders Placed This Encounter  Procedures   Urine Culture   Ambulatory referral to General Surgery   Cheral Marker CNM, [facility] 01/09/2023 12:32 PM

## 2023-01-09 NOTE — Patient Instructions (Signed)
Alyssa Sandoval, thank you for choosing our office today! We appreciate the opportunity to meet your healthcare needs. You may receive a short survey by mail, e-mail, or through [employer]. If you are happy with your care we would appreciate if you could take just a few minutes to complete the survey questions. We read all of your comments and take your feedback very seriously. Thank you again for choosing our office.  Center for [employer] Team at [facility]  [facility] at [facility] ([address] [place], [place] [address]) Entrance C, located off of [address] Free 24/7 valet parking   CLASSES: Go to [email or web address] to register for classes (childbirth, breastfeeding, waterbirth, infant CPR, daddy bootcamp, etc.)  Call the office [phone]) or go to [facility] if: You begin to have strong, frequent contractions Your water breaks.  Sometimes it is a big gush of fluid, sometimes it is just a trickle that keeps getting your panties wet or running down your legs You have vaginal bleeding.  It is normal to have a small amount of spotting if your cervix was checked.  You don't feel your baby moving like normal.  If you don't, get you something to eat and drink and lay down and focus on feeling your baby move.   If your baby is still not moving like normal, you should call the office or go to [facility].  Call the office [phone]) or go to [facility] hospital for these signs of pre-eclampsia: Severe headache that does not go away with Tylenol Visual changes- seeing spots, double, blurred vision Pain under your right breast or upper abdomen that does not go away with Tums or heartburn medicine Nausea and/or vomiting Severe swelling in your hands, feet, and face   Tdap Vaccine It is recommended that you get the Tdap vaccine during the third trimester of EACH pregnancy to help protect your baby from getting pertussis (whooping cough) 27-36 weeks is the BEST time to  do this so that you can pass the protection on to your baby. During pregnancy is better than after pregnancy, but if you are unable to get it during pregnancy it will be offered at the hospital.  You can get this vaccine with [place], at the health department, your family doctor, or some local pharmacies Everyone who will be around your baby should also be up-to-date on their vaccines before the baby comes. Adults (who are not pregnant) only need 1 dose of Tdap during adulthood.   [facility] Pediatricians/Family Doctors [facility]): [address] Colette Ribas, [phone]           [facility]: [address], [phone]                [facility]): [address], [phone] (call to ask if accepting patients) [facility] Department: [address], [place], [phone]    [facility] Pediatricians/Family Doctors [facility]): [phone] [address], [phone] [facility]: [address] [place], [phone] [facility]: [address], [phone]  [facility] Doctors  [facility]): [phone] [facility]: [address], [phone]   [facility] Doctors [facility]: [address], [phone]  [facility] Family Doctors  [facility]: [phone], [phone]  Home Blood Pressure Monitoring for Patients   Your provider has recommended that you check your  blood pressure (BP) at least once a week at home. If you do not have a blood pressure cuff at home, one will be provided for you. Contact your provider if you have not received your monitor within 1 week.   Helpful Tips for Accurate Home Blood Pressure Checks  Don't smoke, exercise, or drink caffeine 30 minutes before checking your BP Use the restroom before checking your BP (a full bladder can raise your  pressure) Relax in a comfortable upright chair Feet on the ground Left arm resting comfortably on a flat surface at the level of your heart Legs uncrossed Back supported Sit quietly and don't talk Place the cuff on your bare arm Adjust snuggly, so that only two fingertips can fit between your skin and the top of the cuff Check 2 readings separated by at least one minute Keep a log of your BP readings For a visual, please reference this diagram: http://ccnc.care/bpdiagram  Provider Name: [facility]     Phone: [phone]  Zone 1: ALL CLEAR  Continue to monitor your symptoms:  BP reading is less than 140 (top number) or less than 90 (bottom number)  No right upper stomach pain No headaches or seeing spots No feeling nauseated or throwing up No swelling in face and hands  Zone 2: CAUTION Call your doctor's office for any of the following:  BP reading is greater than 140 (top number) or greater than 90 (bottom number)  Stomach pain under your ribs in the middle or right side Headaches or seeing spots Feeling nauseated or throwing up Swelling in face and hands  Zone 3: EMERGENCY  Seek immediate medical care if you have any of the following:  BP reading is greater than160 (top number) or greater than 110 (bottom number) Severe headaches not improving with Tylenol Serious difficulty catching your breath Any worsening symptoms from Zone 2   Third Trimester of Pregnancy The third trimester is from week 29 through week 42, months 7 through 9. The third trimester is a time when the fetus is growing rapidly. At the end of the ninth month, the fetus is about 20 inches in length and weighs 6-10 pounds.  BODY CHANGES Your body goes through many changes during pregnancy. The changes vary from woman to woman.  Your weight will continue to increase. You can expect to gain 25-35 pounds (11-16 kg) by the end of the pregnancy. You may begin to get stretch marks on your hips, abdomen,  and breasts. You may urinate more often because the fetus is moving lower into your pelvis and pressing on your bladder. You may develop or continue to have heartburn as a result of your pregnancy. You may develop constipation because certain hormones are causing the muscles that push waste through your intestines to slow down. You may develop hemorrhoids or swollen, bulging veins (varicose veins). You may have pelvic pain because of the weight gain and pregnancy hormones relaxing your joints between the bones in your pelvis. Backaches may result from overexertion of the muscles supporting your posture. You may have changes in your hair. These can include thickening of your hair, rapid growth, and changes in texture. Some women also have hair loss during or after pregnancy, or hair that feels dry or thin. Your hair will most likely return to normal after your baby is born. Your breasts will continue to grow and be tender. A yellow discharge may leak from your breasts called colostrum. Your belly button may stick out. You may  feel short of breath because of your expanding uterus. You may notice the fetus "dropping," or moving lower in your abdomen. You may have a bloody mucus discharge. This usually occurs a few days to a week before labor begins. Your cervix becomes thin and soft (effaced) near your due date. WHAT TO EXPECT AT YOUR PRENATAL EXAMS  You will have prenatal exams every 2 weeks until week 36. Then, you will have weekly prenatal exams. During a routine prenatal visit: You will be weighed to make sure you and the fetus are growing normally. Your blood pressure is taken. Your abdomen will be measured to track your baby's growth. The fetal heartbeat will be listened to. Any test results from the previous visit will be discussed. You may have a cervical check near your due date to see if you have effaced. At around 36 weeks, your caregiver will check your cervix. At the same time, your  caregiver will also perform a test on the secretions of the vaginal tissue. This test is to determine if a type of bacteria, Group B streptococcus, is present. Your caregiver will explain this further. Your caregiver may ask you: What your birth plan is. How you are feeling. If you are feeling the baby move. If you have had any abnormal symptoms, such as leaking fluid, bleeding, severe headaches, or abdominal cramping. If you have any questions. Other tests or screenings that may be performed during your third trimester include: Blood tests that check for low iron levels (anemia). Fetal testing to check the health, activity level, and growth of the fetus. Testing is done if you have certain medical conditions or if there are problems during the pregnancy. FALSE LABOR You may feel small, irregular contractions that eventually go away. These are called Braxton Hicks contractions, or false labor. Contractions may last for hours, days, or even weeks before true labor sets in. If contractions come at regular intervals, intensify, or become painful, it is best to be seen by your caregiver.  SIGNS OF LABOR  Menstrual-like cramps. Contractions that are 5 minutes apart or less. Contractions that start on the top of the uterus and spread down to the lower abdomen and back. A sense of increased pelvic pressure or back pain. A watery or bloody mucus discharge that comes from the vagina. If you have any of these signs before the 37th week of pregnancy, call your caregiver right away. You need to go to the hospital to get checked immediately. HOME CARE INSTRUCTIONS  Avoid all smoking, herbs, alcohol, and unprescribed drugs. These chemicals affect the formation and growth of the baby. Follow your caregiver's instructions regarding medicine use. There are medicines that are either safe or unsafe to take during pregnancy. Exercise only as directed by your caregiver. Experiencing uterine cramps is a good sign to  stop exercising. Continue to eat regular, healthy meals. Wear a good support bra for breast tenderness. Do not use hot tubs, steam rooms, or saunas. Wear your seat belt at all times when driving. Avoid raw meat, uncooked cheese, cat litter boxes, and soil used by cats. These carry germs that can cause birth defects in the baby. Take your prenatal vitamins. Try taking a stool softener (if your caregiver approves) if you develop constipation. Eat more high-fiber foods, such as fresh vegetables or fruit and whole grains. Drink plenty of fluids to keep your urine clear or pale yellow. Take warm sitz baths to soothe any pain or discomfort caused by hemorrhoids. Use hemorrhoid cream if  your caregiver approves. If you develop varicose veins, wear support hose. Elevate your feet for 15 minutes, 3-4 times a day. Limit salt in your diet. Avoid heavy lifting, wear low heal shoes, and practice good posture. Rest a lot with your legs elevated if you have leg cramps or low back pain. Visit your dentist if you have not gone during your pregnancy. Use a soft toothbrush to brush your teeth and be gentle when you floss. A sexual relationship may be continued unless your caregiver directs you otherwise. Do not travel far distances unless it is absolutely necessary and only with the approval of your caregiver. Take prenatal classes to understand, practice, and ask questions about the labor and delivery. Make a trial run to the hospital. Pack your hospital bag. Prepare the baby's nursery. Continue to go to all your prenatal visits as directed by your caregiver. SEEK MEDICAL CARE IF: You are unsure if you are in labor or if your water has broken. You have dizziness. You have mild pelvic cramps, pelvic pressure, or nagging pain in your abdominal area. You have persistent nausea, vomiting, or diarrhea. You have a bad smelling vaginal discharge. You have pain with urination. SEEK IMMEDIATE MEDICAL CARE IF:  You  have a fever. You are leaking fluid from your vagina. You have spotting or bleeding from your vagina. You have severe abdominal cramping or pain. You have rapid weight loss or gain. You have shortness of breath with chest pain. You notice sudden or extreme swelling of your face, hands, ankles, feet, or legs. You have not felt your baby move in over an hour. You have severe headaches that do not go away with medicine. You have vision changes. Document Released: 03/20/2001 Document Revised: 03/31/2013 Document Reviewed: 05/27/2012 [facility] Patient Information 2015 [place], [place]. This information is not intended to replace advice given to you by your health care provider. Make sure you discuss any questions you have with your health care provider.

## 2023-01-09 NOTE — Telephone Encounter (Signed)
Preadmission screen  

## 2023-01-10 ENCOUNTER — Inpatient Hospital Stay (HOSPITAL_COMMUNITY): Payer: MEDICAID | Admitting: Anesthesiology

## 2023-01-10 ENCOUNTER — Ambulatory Visit: Payer: MEDICAID | Admitting: Women's Health

## 2023-01-10 ENCOUNTER — Inpatient Hospital Stay (HOSPITAL_COMMUNITY)
Admission: RE | Admit: 2023-01-10 | Discharge: 2023-01-12 | DRG: 806 | Disposition: A | Discharge status: Home / Self Care | Payer: MEDICAID | Attending: Obstetrics and Gynecology | Admitting: Obstetrics and Gynecology

## 2023-01-10 ENCOUNTER — Encounter (HOSPITAL_COMMUNITY): Payer: Self-pay | Admitting: Obstetrics and Gynecology

## 2023-01-10 DIAGNOSIS — B20 Human immunodeficiency virus [HIV] disease: Secondary | ICD-10-CM | POA: Diagnosis present

## 2023-01-10 DIAGNOSIS — Z30017 Encounter for initial prescription of implantable subdermal contraceptive: Secondary | ICD-10-CM | POA: Diagnosis not present

## 2023-01-10 DIAGNOSIS — O99824 Streptococcus B carrier state complicating childbirth: Secondary | ICD-10-CM | POA: Diagnosis present

## 2023-01-10 DIAGNOSIS — Z825 Family history of asthma and other chronic lower respiratory diseases: Secondary | ICD-10-CM | POA: Diagnosis not present

## 2023-01-10 DIAGNOSIS — O24425 Gestational diabetes mellitus in childbirth, controlled by oral hypoglycemic drugs: Principal | ICD-10-CM | POA: Diagnosis present

## 2023-01-10 DIAGNOSIS — O99891 Other specified diseases and conditions complicating pregnancy: Secondary | ICD-10-CM | POA: Diagnosis not present

## 2023-01-10 DIAGNOSIS — O0990 Supervision of high risk pregnancy, unspecified, unspecified trimester: Principal | ICD-10-CM

## 2023-01-10 DIAGNOSIS — A6000 Herpesviral infection of urogenital system, unspecified: Secondary | ICD-10-CM | POA: Diagnosis present

## 2023-01-10 DIAGNOSIS — O0993 Supervision of high risk pregnancy, unspecified, third trimester: Secondary | ICD-10-CM | POA: Diagnosis not present

## 2023-01-10 DIAGNOSIS — O9872 Human immunodeficiency virus [HIV] disease complicating childbirth: Secondary | ICD-10-CM | POA: Diagnosis present

## 2023-01-10 DIAGNOSIS — K8020 Calculus of gallbladder without cholecystitis without obstruction: Secondary | ICD-10-CM | POA: Diagnosis present

## 2023-01-10 DIAGNOSIS — Z87891 Personal history of nicotine dependence: Secondary | ICD-10-CM

## 2023-01-10 DIAGNOSIS — Z833 Family history of diabetes mellitus: Secondary | ICD-10-CM | POA: Diagnosis not present

## 2023-01-10 DIAGNOSIS — R1010 Upper abdominal pain, unspecified: Secondary | ICD-10-CM | POA: Diagnosis not present

## 2023-01-10 DIAGNOSIS — K219 Gastro-esophageal reflux disease without esophagitis: Secondary | ICD-10-CM | POA: Diagnosis present

## 2023-01-10 DIAGNOSIS — A609 Anogenital herpesviral infection, unspecified: Secondary | ICD-10-CM | POA: Diagnosis present

## 2023-01-10 DIAGNOSIS — O9962 Diseases of the digestive system complicating childbirth: Secondary | ICD-10-CM | POA: Diagnosis present

## 2023-01-10 DIAGNOSIS — O26893 Other specified pregnancy related conditions, third trimester: Secondary | ICD-10-CM | POA: Diagnosis present

## 2023-01-10 DIAGNOSIS — O358XX0 Maternal care for other (suspected) fetal abnormality and damage, not applicable or unspecified: Secondary | ICD-10-CM | POA: Diagnosis present

## 2023-01-10 DIAGNOSIS — O9081 Anemia of the puerperium: Secondary | ICD-10-CM | POA: Diagnosis not present

## 2023-01-10 DIAGNOSIS — Z8616 Personal history of COVID-19: Secondary | ICD-10-CM

## 2023-01-10 DIAGNOSIS — Z8632 Personal history of gestational diabetes: Secondary | ICD-10-CM | POA: Diagnosis present

## 2023-01-10 DIAGNOSIS — Z3A38 38 weeks gestation of pregnancy: Secondary | ICD-10-CM

## 2023-01-10 DIAGNOSIS — Z8249 Family history of ischemic heart disease and other diseases of the circulatory system: Secondary | ICD-10-CM | POA: Diagnosis not present

## 2023-01-10 DIAGNOSIS — D62 Acute posthemorrhagic anemia: Secondary | ICD-10-CM | POA: Diagnosis not present

## 2023-01-10 DIAGNOSIS — Z818 Family history of other mental and behavioral disorders: Secondary | ICD-10-CM

## 2023-01-10 DIAGNOSIS — O9832 Other infections with a predominantly sexual mode of transmission complicating childbirth: Secondary | ICD-10-CM | POA: Diagnosis present

## 2023-01-10 DIAGNOSIS — O24424 Gestational diabetes mellitus in childbirth, insulin controlled: Secondary | ICD-10-CM | POA: Diagnosis not present

## 2023-01-10 DIAGNOSIS — O24419 Gestational diabetes mellitus in pregnancy, unspecified control: Principal | ICD-10-CM

## 2023-01-10 DIAGNOSIS — O99820 Streptococcus B carrier state complicating pregnancy: Secondary | ICD-10-CM | POA: Diagnosis not present

## 2023-01-10 DIAGNOSIS — O24415 Gestational diabetes mellitus in pregnancy, controlled by oral hypoglycemic drugs: Secondary | ICD-10-CM

## 2023-01-10 LAB — COMPREHENSIVE METABOLIC PANEL
ALT: 16 U/L (ref 0–44)
AST: 17 U/L (ref 15–41)
Albumin: 3.1 g/dL — ABNORMAL LOW (ref 3.5–5.0)
Alkaline Phosphatase: 157 U/L — ABNORMAL HIGH (ref 38–126)
Anion gap: 10 (ref 5–15)
BUN: 6 mg/dL (ref 6–20)
CO2: 19 mmol/L — ABNORMAL LOW (ref 22–32)
Calcium: 9 mg/dL (ref 8.9–10.3)
Chloride: 107 mmol/L (ref 98–111)
Creatinine, Ser: 0.48 mg/dL (ref 0.44–1.00)
GFR, Estimated: >60 mL/min (ref >60)
Glucose, Bld: 90 mg/dL (ref 70–99)
Potassium: 3.5 mmol/L (ref 3.5–5.1)
Sodium: 136 mmol/L (ref 135–145)
Total Bilirubin: 0.6 mg/dL (ref 0.3–1.2)
Total Protein: 7.1 g/dL (ref 6.5–8.1)

## 2023-01-10 LAB — TYPE AND SCREEN
ABO/RH(D): AB POS
Antibody Screen: NEGATIVE

## 2023-01-10 LAB — CBC
HCT: 34.8 % — ABNORMAL LOW (ref 36.0–46.0)
Hemoglobin: 11 g/dL — ABNORMAL LOW (ref 12.0–15.0)
MCH: 24.4 pg — ABNORMAL LOW (ref 26.0–34.0)
MCHC: 31.6 g/dL (ref 30.0–36.0)
MCV: 77.2 fL — ABNORMAL LOW (ref 80.0–100.0)
Platelets: 358 10*3/uL (ref 150–400)
RBC: 4.51 MIL/uL (ref 3.87–5.11)
RDW: 15.3 % (ref 11.5–15.5)
WBC: 16.1 10*3/uL — ABNORMAL HIGH (ref 4.0–10.5)
nRBC: 0 % (ref 0.0–0.2)

## 2023-01-10 LAB — PROTEIN / CREATININE RATIO, URINE
Creatinine, Urine: 105 mg/dL
Protein Creatinine Ratio: 0.16 mg/mg{creat} — ABNORMAL HIGH (ref 0.00–0.15)
Total Protein, Urine: 17 mg/dL

## 2023-01-10 LAB — RUPTURE OF MEMBRANE (ROM)PLUS: Rom Plus: NEGATIVE

## 2023-01-10 MED ORDER — LACTATED RINGERS IV SOLN: INTRAVENOUS | Status: DC

## 2023-01-10 MED ORDER — SODIUM CHLORIDE 0.9 % IV SOLN: 250.0000 mL | INTRAVENOUS | Status: DC | PRN

## 2023-01-10 MED ORDER — ZIDOVUDINE 10 MG/ML IV SOLN
2.0000 mg/kg | Freq: Once | INTRAVENOUS | Status: AC
  Administered 2023-01-10: 163 mg via INTRAVENOUS
  Filled 2023-01-10: qty 16.3

## 2023-01-10 MED ORDER — EPHEDRINE 5 MG/ML INJ: 10.0000 mg | INTRAVENOUS | Status: DC | PRN

## 2023-01-10 MED ORDER — ONDANSETRON HCL 4 MG/2ML IJ SOLN: 4.0000 mg | INTRAMUSCULAR | Status: DC | PRN

## 2023-01-10 MED ORDER — PRENATAL MULTIVITAMIN CH
1.0000 | ORAL_TABLET | Freq: Every day | ORAL | Status: DC
  Administered 2023-01-11 – 2023-01-12 (×2): 1 via ORAL
  Filled 2023-01-10 (×2): qty 1

## 2023-01-10 MED ORDER — LACTATED RINGERS IV SOLN: 500.0000 mL | Freq: Once | INTRAVENOUS | Status: DC

## 2023-01-10 MED ORDER — SODIUM CHLORIDE 0.9 % IV SOLN: 1.0000 g | INTRAVENOUS | Status: DC

## 2023-01-10 MED ORDER — PHENYLEPHRINE 80 MCG/ML (10ML) SYRINGE FOR IV PUSH (FOR BLOOD PRESSURE SUPPORT): 80.0000 ug | PREFILLED_SYRINGE | INTRAVENOUS | Status: DC | PRN

## 2023-01-10 MED ORDER — BICTEGRAVIR-EMTRICITAB-TENOFOV 50-200-25 MG PO TABS
1.0000 | ORAL_TABLET | Freq: Every day | ORAL | Status: DC
  Administered 2023-01-10 – 2023-01-12 (×3): 1 via ORAL
  Filled 2023-01-10 (×3): qty 1

## 2023-01-10 MED ORDER — LIDOCAINE HCL (PF) 1 % IJ SOLN: 30.0000 mL | INTRAMUSCULAR | Status: DC | PRN

## 2023-01-10 MED ORDER — OXYCODONE-ACETAMINOPHEN 5-325 MG PO TABS: 2.0000 | ORAL_TABLET | ORAL | Status: DC | PRN

## 2023-01-10 MED ORDER — LIDOCAINE HCL (PF) 1 % IJ SOLN
INTRAMUSCULAR | Status: DC | PRN
  Administered 2023-01-10 (×2): 4 mL via EPIDURAL

## 2023-01-10 MED ORDER — WITCH HAZEL-GLYCERIN EX PADS: 1.0000 | MEDICATED_PAD | CUTANEOUS | Status: DC | PRN

## 2023-01-10 MED ORDER — SODIUM CHLORIDE 0.9% FLUSH: 3.0000 mL | INTRAVENOUS | Status: DC | PRN

## 2023-01-10 MED ORDER — DIPHENHYDRAMINE HCL 25 MG PO CAPS: 25.0000 mg | ORAL_CAPSULE | Freq: Four times a day (QID) | ORAL | Status: DC | PRN

## 2023-01-10 MED ORDER — OXYTOCIN-SODIUM CHLORIDE 30-0.9 UT/500ML-% IV SOLN
2.5000 [IU]/h | INTRAVENOUS | Status: DC
  Filled 2023-01-10: qty 500

## 2023-01-10 MED ORDER — SODIUM CHLORIDE 0.9 % IV SOLN
2.0000 g | Freq: Once | INTRAVENOUS | Status: AC
  Administered 2023-01-10: 2 g via INTRAVENOUS
  Filled 2023-01-10: qty 2000

## 2023-01-10 MED ORDER — IBUPROFEN 600 MG PO TABS
600.0000 mg | ORAL_TABLET | Freq: Four times a day (QID) | ORAL | Status: DC
  Administered 2023-01-10 – 2023-01-12 (×7): 600 mg via ORAL
  Filled 2023-01-10 (×7): qty 1

## 2023-01-10 MED ORDER — SODIUM CHLORIDE 0.9% FLUSH
3.0000 mL | Freq: Two times a day (BID) | INTRAVENOUS | Status: DC
  Administered 2023-01-11: 3 mL via INTRAVENOUS

## 2023-01-10 MED ORDER — METHYLERGONOVINE MALEATE 0.2 MG/ML IJ SOLN
INTRAMUSCULAR | Status: AC
  Filled 2023-01-10: qty 1

## 2023-01-10 MED ORDER — SENNOSIDES-DOCUSATE SODIUM 8.6-50 MG PO TABS
2.0000 | ORAL_TABLET | ORAL | Status: DC
  Administered 2023-01-11 – 2023-01-12 (×2): 2 via ORAL
  Filled 2023-01-10 (×2): qty 2

## 2023-01-10 MED ORDER — ACETAMINOPHEN 325 MG PO TABS: 650.0000 mg | ORAL_TABLET | ORAL | Status: DC | PRN

## 2023-01-10 MED ORDER — MISOPROSTOL 200 MCG PO TABS
ORAL_TABLET | ORAL | Status: AC
  Administered 2023-01-10: 1000 ug via RECTAL
  Filled 2023-01-10: qty 5

## 2023-01-10 MED ORDER — SIMETHICONE 80 MG PO CHEW: 80.0000 mg | CHEWABLE_TABLET | ORAL | Status: DC | PRN

## 2023-01-10 MED ORDER — BENZOCAINE-MENTHOL 20-0.5 % EX AERO: 1.0000 | INHALATION_SPRAY | CUTANEOUS | Status: DC | PRN

## 2023-01-10 MED ORDER — COCONUT OIL OIL: 1.0000 | TOPICAL_OIL | Status: DC | PRN

## 2023-01-10 MED ORDER — TRANEXAMIC ACID-NACL 1000-0.7 MG/100ML-% IV SOLN: 1000.0000 mg | Freq: Once | INTRAVENOUS | Status: DC

## 2023-01-10 MED ORDER — OXYTOCIN BOLUS FROM INFUSION
333.0000 mL | Freq: Once | INTRAVENOUS | Status: AC
  Administered 2023-01-10: 333 mL via INTRAVENOUS

## 2023-01-10 MED ORDER — LACTATED RINGERS IV SOLN: 500.0000 mL | INTRAVENOUS | Status: DC | PRN

## 2023-01-10 MED ORDER — ZOLPIDEM TARTRATE 5 MG PO TABS: 5.0000 mg | ORAL_TABLET | Freq: Every evening | ORAL | Status: DC | PRN

## 2023-01-10 MED ORDER — MISOPROSTOL 200 MCG PO TABS: 1000.0000 ug | ORAL_TABLET | Freq: Once | ORAL | Status: AC

## 2023-01-10 MED ORDER — SOD CITRATE-CITRIC ACID 500-334 MG/5ML PO SOLN: 30.0000 mL | ORAL | Status: DC | PRN

## 2023-01-10 MED ORDER — ZIDOVUDINE 10 MG/ML IV SOLN
1.0000 mg/kg/h | INTRAVENOUS | Status: DC
  Administered 2023-01-10: 1 mg/kg/h via INTRAVENOUS
  Filled 2023-01-10: qty 40

## 2023-01-10 MED ORDER — FENTANYL-BUPIVACAINE-NACL 0.5-0.125-0.9 MG/250ML-% EP SOLN
12.0000 mL/h | EPIDURAL | Status: DC | PRN
  Administered 2023-01-10: 12 mL/h via EPIDURAL
  Filled 2023-01-10: qty 250

## 2023-01-10 MED ORDER — ONDANSETRON HCL 4 MG/2ML IJ SOLN
4.0000 mg | Freq: Four times a day (QID) | INTRAMUSCULAR | Status: DC | PRN
  Administered 2023-01-10: 4 mg via INTRAVENOUS
  Filled 2023-01-10: qty 2

## 2023-01-10 MED ORDER — DIPHENHYDRAMINE HCL 50 MG/ML IJ SOLN
12.5000 mg | INTRAMUSCULAR | Status: DC | PRN
  Administered 2023-01-10: 12.5 mg via INTRAVENOUS
  Filled 2023-01-10: qty 1

## 2023-01-10 MED ORDER — DIBUCAINE (PERIANAL) 1 % EX OINT: 1.0000 | TOPICAL_OINTMENT | CUTANEOUS | Status: DC | PRN

## 2023-01-10 MED ORDER — FENTANYL CITRATE (PF) 100 MCG/2ML IJ SOLN
50.0000 ug | Freq: Once | INTRAMUSCULAR | Status: AC
  Administered 2023-01-10: 50 ug via INTRAVENOUS
  Filled 2023-01-10: qty 2

## 2023-01-10 MED ORDER — TRANEXAMIC ACID-NACL 1000-0.7 MG/100ML-% IV SOLN
INTRAVENOUS | Status: AC
  Administered 2023-01-10: 1000 mg
  Filled 2023-01-10: qty 100

## 2023-01-10 MED ORDER — OXYCODONE-ACETAMINOPHEN 5-325 MG PO TABS: 1.0000 | ORAL_TABLET | ORAL | Status: DC | PRN

## 2023-01-10 MED ORDER — ACETAMINOPHEN 325 MG PO TABS
650.0000 mg | ORAL_TABLET | ORAL | Status: DC | PRN
  Administered 2023-01-10 – 2023-01-11 (×2): 650 mg via ORAL
  Filled 2023-01-10 (×2): qty 2

## 2023-01-10 MED ORDER — ONDANSETRON HCL 4 MG PO TABS: 4.0000 mg | ORAL_TABLET | ORAL | Status: DC | PRN

## 2023-01-10 NOTE — Discharge Summary (Signed)
Postpartum Discharge Summary     Patient Name: Alyssa Sandoval DOB: [birth year] MRN: [id number]  Date of admission: 01/10/2023 Delivery date:01/10/2023 Delivering provider: CHUBB, CASEY C Date of discharge: 01/12/2023  Admitting diagnosis: Gestational diabetes [O24.419] Intrauterine pregnancy: [redacted]w[redacted]d     Secondary diagnosis:  Principal Problem:   Gestational diabetes Active Problems:   HSV (herpes simplex virus) anogenital infection   HIV disease (HCC)  Additional problems: N/a    Discharge diagnosis: Term Pregnancy Delivered                                              Post partum procedures: Nexplanon placement Augmentation: N/A Complications: None  Hospital course: Onset of Labor With Vaginal Delivery      22 y.o. yo [id number] at [redacted]w[redacted]d was admitted in Latent Labor on 01/10/2023. Labor course was complicated by spontaneous deliver before adequate GBS and HIV ppx completed.  Pt received only loading dose of Ampicillin 2g at 1500 and only loading dose and of maintenance dosing of Zidovudine.  Membrane Rupture Time/Date: 6:10 PM,01/10/2023  Delivery Method:Vaginal, Spontaneous Operative Delivery:N/A Episiotomy: None Lacerations:  None Patient had an uncomplicated postpartum course.  She is ambulating, tolerating a regular diet, passing flatus, and urinating well. Patient is discharged home in stable condition on 01/12/23.  Newborn Data: Birth date:01/10/2023 Birth time:6:19 PM Gender:Female Living status:Living Apgars:9 ,9  Weight:3120 g  Magnesium Sulfate received: No BMZ received: No Rhophylac:N/A MMR:N/A T-DaP: declined prenatally  Flu: N/A RSV Vaccine received: No Transfusion:No  Immunizations received: Immunization History  Administered Date(s) Administered   Influenza,inj,Quad PF,6+ Mos 03/15/2021   PFIZER(Purple Top)SARS-COV-2 Vaccination 12/18/2019   Pneumococcal Conjugate-13 12/09/2020    Physical exam  Vitals:   01/11/23 0517 01/11/23 1403  01/11/23 2037 01/12/23 0342  BP: 104/80 107/80 115/79 103/78  Pulse: 100 70 85 89  Resp: 16 16 17 18   Temp: 98.1 F (36.7 C) 97.9 F (36.6 C) 98.6 F (37 C) 98 F (36.7 C)  TempSrc: Oral Oral Oral Oral  SpO2:   100% 98%   General: alert, cooperative, and no distress Lochia: appropriate Uterine Fundus: firm Incision: N/A DVT Evaluation: No cords or calf tenderness. No significant calf/ankle edema. Labs: Lab Results  Component Value Date   WBC 17.8 (H) 01/11/2023   HGB 8.7 (L) 01/11/2023   HCT 27.4 (L) 01/11/2023   MCV 78.1 (L) 01/11/2023   PLT 292 01/11/2023      Latest Ref Rng & Units 01/10/2023    2:22 PM  CMP  Glucose 70 - 99 mg/dL 90   BUN 6 - 20 mg/dL 6   Creatinine 8.29 - 5.62 mg/dL 1.30   Sodium 865 - 784 mmol/L 136   Potassium 3.5 - 5.1 mmol/L 3.5   Chloride 98 - 111 mmol/L 107   CO2 22 - 32 mmol/L 19   Calcium 8.9 - 10.3 mg/dL 9.0   Total Protein 6.5 - 8.1 g/dL 7.1   Total Bilirubin 0.3 - 1.2 mg/dL 0.6   Alkaline Phos 38 - 126 U/L 157   AST 15 - 41 U/L 17   ALT 0 - 44 U/L 16    Edinburgh Score:    01/11/2023    9:50 PM  Edinburgh Postnatal Depression Scale Screening Tool  I have been able to laugh and see the funny side of things. 0  I have looked forward with  enjoyment to things. 0  I have blamed myself unnecessarily when things went wrong. 1  I have been anxious or worried for no good reason. 0  I have felt scared or panicky for no good reason. 0  Things have been getting on top of me. 0  I have been so unhappy that I have had difficulty sleeping. 0  I have felt sad or miserable. 0  I have been so unhappy that I have been crying. 0  The thought of harming myself has occurred to me. 0  Edinburgh Postnatal Depression Scale Total 1   Edinburgh Postnatal Depression Scale Total: 1   After visit meds:  Allergies as of 01/12/2023   No Known Allergies      Medication List     STOP taking these medications    Accu-Chek Guide test  strip Generic drug: glucose blood   Accu-Chek Softclix Lancets lancets   metFORMIN 500 MG tablet Commonly known as: GLUCOPHAGE   oxyCODONE-acetaminophen 5-325 MG tablet Commonly known as: PERCOCET/ROXICET   valACYclovir 500 MG tablet Commonly known as: VALTREX       TAKE these medications    acetaminophen 500 MG tablet Commonly known as: TYLENOL Take 2 tablets (1,000 mg total) by mouth every 8 (eight) hours as needed for moderate pain.   Biktarvy 50-200-25 MG Tabs tablet Generic drug: bictegravir-emtricitabine-tenofovir AF Take 1 tablet by mouth daily.   cyclobenzaprine 10 MG tablet Commonly known as: FLEXERIL Take 10 mg by mouth 3 (three) times daily as needed for muscle spasms.   ferrous sulfate 325 (65 FE) MG tablet Take 1 tablet (325 mg total) by mouth every other day.   hydrOXYzine 25 MG tablet Commonly known as: ATARAX Take 1 tablet (25 mg total) by mouth every 8 (eight) hours as needed for anxiety. Do not take prior to operating heavy machinery -- may cause drowsiness.   ibuprofen 600 MG tablet Commonly known as: ADVIL Take 1 tablet (600 mg total) by mouth every 6 (six) hours.   PRENATAL GUMMIES PO Take by mouth.   promethazine 25 MG tablet Commonly known as: PHENERGAN Take 1 tablet (25 mg total) by mouth every 6 (six) hours as needed for nausea or vomiting.   senna-docusate 8.6-50 MG tablet Commonly known as: Senokot-S Take 2 tablets by mouth daily.   sertraline 50 MG tablet Commonly known as: ZOLOFT Take half a tablet daily x1 week then take 1 full tablet daily.         Discharge home in stable condition Infant Feeding: Bottle Infant Disposition:home with mother Discharge instruction: per After Visit Summary and Postpartum booklet. Activity: Advance as tolerated. Pelvic rest for 6 weeks.  Diet: routine diet Future Appointments: Future Appointments  Date Time Provider Department Center  01/31/2023  2:15 PM Lucretia Roers, MD [facility]  None  05/02/2023 10:30 AM Veryl Speak, FNP [facility]  05/03/2023  3:30 PM Alfonse Spruce, MD [facility] None   Follow up Visit: Message sent to [facility] 10/3 Message sent to ID 10/5  Please schedule this patient for a In person postpartum visit in 4 weeks with the following provider: Any provider. Additional Postpartum F/U:2 hour GTT  High risk pregnancy complicated by: GDM and HIV, HSV Delivery mode:  Vaginal, Spontaneous Anticipated Birth Control:  Nexplanon   01/12/2023 Sundra Aland, MD

## 2023-01-10 NOTE — Anesthesia Preprocedure Evaluation (Addendum)
Anesthesia Evaluation  Patient identified by MRN, date of birth, ID band Patient awake    Reviewed: Allergy & Precautions, NPO status , Patient's Chart, lab work & pertinent test results  History of Anesthesia Complications Negative for: history of anesthetic complications  Airway Mallampati: III  TM Distance: >3 FB Neck ROM: Full    Dental   Pulmonary former smoker   Pulmonary exam normal breath sounds clear to auscultation       Cardiovascular negative cardio ROS  Rhythm:Regular Rate:Normal     Neuro/Psych  Headaches PSYCHIATRIC DISORDERS  Depression       GI/Hepatic ,GERD  ,,  Endo/Other  diabetes, Gestational, Oral Hypoglycemic Agents    Renal/GU      Musculoskeletal   Abdominal   Peds  Hematology  (+) Blood dyscrasia, anemia Lab Results      Component                Value               Date                      WBC                      16.1 (H)            01/10/2023                HGB                      11.0 (L)            01/10/2023                HCT                      34.8 (L)            01/10/2023                MCV                      77.2 (L)            01/10/2023                PLT                      358                 01/10/2023              Anesthesia Other Findings HIV on Biktarvy (CD4 895)  Reproductive/Obstetrics (+) Pregnancy                             Anesthesia Physical Anesthesia Plan  ASA: 3  Anesthesia Plan: Epidural   Post-op Pain Management:    Induction:   PONV Risk Score and Plan:   Airway Management Planned:   Additional Equipment:   Intra-op Plan:   Post-operative Plan:   Informed Consent: I have reviewed the patients History and Physical, chart, labs and discussed the procedure including the risks, benefits and alternatives for the proposed anesthesia with the patient or authorized representative who has indicated his/her  understanding and acceptance.       Plan Discussed with: Anesthesiologist  Anesthesia Plan Comments: (I have discussed risks  of neuraxial anesthesia including but not limited to infection, bleeding, nerve injury, back pain, headache, seizures, and failure of block. Patient denies bleeding disorders and is not currently anticoagulated. Labs have been reviewed. Risks and benefits discussed. All patient's questions answered.  )        Anesthesia Quick Evaluation

## 2023-01-10 NOTE — Lactation Note (Signed)
This note was copied from a baby's chart. Lactation Consultation Note  Patient Name: Alyssa Sandoval [id number] Date: 01/10/2023 Age:23 hours   Mom chooses to formula feed.   Maternal Data    Feeding Nipple Type: Slow - flow  LATCH Score                    Lactation Tools Discussed/Used    Interventions    Discharge    Consult Status Consult Status: Complete    Claudina Oliphant G 01/10/2023, 9:19 PM

## 2023-01-10 NOTE — Progress Notes (Signed)
Work-in HIGH-RISK PREGNANCY VISIT Patient name: Alyssa Sandoval MRN [id number]  Date of birth: 09-Oct-2000 Chief Complaint:   No chief complaint on file.  History of Present Illness:   Alyssa Sandoval is a 21 y.o. G57P1011 female at [redacted]w[redacted]d with an Estimated Date of Delivery: 01/23/23 being seen today for ongoing management of a high-risk pregnancy complicated by diabetes mellitus A2DM currently on metformin  and HIV.    She is being seen as a work-in for r/o labor. Contractions started last night, worsened this am. No vb or LOF. Good FM.      12/14/2022   11:10 AM 08/29/2022    3:16 PM 07/23/2022   10:06 AM 01/24/2022    9:51 AM 12/19/2021    9:46 AM  Depression screen PHQ 2/9  Decreased Interest 0 0 0 3 0  Down, Depressed, Hopeless 0 0 0 0 0  PHQ - 2 Score 0 0 0 3 0  Altered sleeping   3 3   Tired, decreased energy   3 3   Change in appetite   0 0   Feeling bad or failure about yourself    0 0   Trouble concentrating   0 0   Moving slowly or fidgety/restless   0 0   Suicidal thoughts   0 0   PHQ-9 Score   6 9         07/23/2022   10:07 AM 01/24/2022    9:52 AM 08/09/2021    3:56 PM 10/19/2020    2:11 PM  GAD 7 : Generalized Anxiety Score  Nervous, Anxious, on Edge 1 0 1 3  Control/stop worrying 0 0 0 3  Worry too much - different things 0 0 1 3  Trouble relaxing 1 0 0 3  Restless 0 0 0 0  Easily annoyed or irritable 0 0 0 3  Afraid - awful might happen 0 0 0 3  Total GAD 7 Score 2 0 2 18     Review of Systems:   Pertinent items are noted in HPI Denies abnormal vaginal discharge w/ itching/odor/irritation, headaches, visual changes, shortness of breath, chest pain, abdominal pain, severe nausea/vomiting, or problems with urination or bowel movements unless otherwise stated above. Pertinent History Reviewed:  Reviewed past medical,surgical, social, obstetrical and family history.  Reviewed problem list, medications and allergies. Physical Assessment:  There were no  vitals filed for this visit.There is no height or weight on file to calculate BMI.           Physical Examination:   General appearance: alert, uncomfortable w/ contractions  Mental status: alert, oriented to person, place, and time  Skin: warm & dry   Extremities:      Cardiovascular: normal heart rate noted  Respiratory: normal respiratory effort, no distress  Abdomen: gravid, soft, non-tender  Pelvic: Cervical exam performed  Dilation: 5 Effacement (%): 90 Station: -2  Fetal Status:          Fetal Surveillance Testing today: on monitor x , baseline 140, mod variability, no accel, mild variable w/ 2 of 3 contractions. UCs q    Chaperone:  Dr. Charlotta Newton     No results found for this or any previous visit (from the past 24 hour(s)).  Assessment & Plan:  High-risk pregnancy: G3P1011 at [redacted]w[redacted]d with an Estimated Date of Delivery: 01/23/23   1) HIV, last VL 51 (9/6) on meds  2) A2DM, on metformin, last EFW 56% @ 36w  3) Fetal  chest mass  4) Labor> to [facility] for direct admit, notified L&D charge and labor team   Meds: No orders of the defined types were placed in this encounter.   Labs/procedures today: SVE and NST  Follow-up: Return for will schedule pp visit after delivery.   Future Appointments  Date Time Provider Department Center  01/11/2023 11:10 AM [facility] NURSE [facility]  01/15/2023  8:50 AM [facility] NURSE [facility]  01/15/2023  9:10 AM Lazaro Arms, MD [facility]  01/16/2023 12:00 AM [facility]-LD SCHED ROOM [facility]-INDC None  01/31/2023  2:15 PM Lucretia Roers, MD RS-RS None  05/02/2023 10:30 AM Veryl Speak, FNP [facility]  05/03/2023  3:30 PM Alfonse Spruce, MD [facility] None    No orders of the defined types were placed in this encounter.  Cheral Marker CNM, [facility] 01/10/2023 1:44 PM

## 2023-01-10 NOTE — H&P (Addendum)
OBSTETRIC ADMISSION HISTORY AND PHYSICAL  Alyssa Sandoval is a 22 y.o. female G3P1011 with IUP at [redacted]w[redacted]d by LMP that is HIV + (viral load 9/6: 51), with A2GDM and GBS +; presenting for SOL with intact membranes. She reports +FMs, no VB, no blurry vision, headaches or peripheral edema, and RUQ pain.  Patient believes she may have ruptured over a week ago but states was seen in MAU with negative ROM plus.  On speculum exam she had what looked to be pooling of amniotic fluid but negative ferning.  Bag of water felt on cervical exam.  On spec exam she had white plaque areas that ruptured, given hx of HSV sent off for culture however not consistent with herpetic lesions after attending review. She is bottle feeding. She request nexplanon for birth control.  She received her [facility]   Dating: By LMP --->  Estimated Date of Delivery: 01/23/23  Sono:    @[redacted]w[redacted]d , CWD, normal anatomy, cephalic presentation, posterior placental lie, 2903g EFW 56 %, AC 85%, HC 53%   Prenatal History/Complications:  Patient Active Problem List   Diagnosis Date Noted   Cholelithiases 01/09/2023   Abnormal fetal ultrasound 12/25/2022   GBS bacteriuria 11/24/2022   History of postpartum hemorrhage 10/31/2022   Gestational diabetes 10/19/2022   HIV (human immunodeficiency virus) risk factors complicating pregnancy 10/17/2022   Supervision of high risk pregnancy, antepartum 07/17/2022   Short interval between pregnancies affecting pregnancy, antepartum 07/17/2022   HIV disease (HCC) 01/29/2022   HSV (herpes simplex virus) anogenital infection 08/09/2021   NURSING  PROVIDER  Office Location [facility] Dating by Lmp 04/18/22  [facility] Model Traditional Anatomy U/S Fetal chest mass, female 'Hinata'  Initiated care at  [employer]  English               LAB RESULTS   Support Person Roger(FOB) Genetics NIPS:   Low risk, female            AFP:  Declined       NT/IT (FT only)         Carrier Screen Horizon: Negative 4/4   Rhogam  AB/Positive/-- (04/15 1056) A1C/GTT Early:   5.4     Third trimester: GDM  Flu Vaccine  Declined-01/02/23      TDaP Vaccine  declined Blood Type AB/Positive/-- (04/15 1056)  Covid Vaccine 1-DOSE Antibody Negative (04/15 1056)      Rubella 2.06 (04/15 1056)  Feeding Plan Undecided RPR Non Reactive (04/15 1056)  Contraception Nexplanon inpatient  HBsAg Negative (04/15 1056)  Circumcision Yes HIV Reactive   Pediatrician  Dr. Adriana Simas HCVAb Non Reactive (04/15 1056)  Prenatal Classes  offered          Pap       Diagnosis  Date Value Ref Range Status  08/09/2021     Final    - Negative for intraepithelial lesion or malignancy (NILM)    BTLConsent NA GC/CT Initial:  Neg/Neg   36wks: neg/neg  VBAC  Consent NA GBS Positive/-- (10/18 1511) For PCN allergy, check sensitivities            DME Rx [ X] BP cuff [ ]  Weight Scale Waterbirth  [ ]  Class [ ]  Consent [ ]  CNM visit  PHQ9 & GAD7 [ x ] new OB [  ] 28 weeks  [  ] 36 weeks Induction  [ ]   Orders Entered [ ] Foley Y/N     Past Medical History: Past Medical History:  Diagnosis Date   Anemia    Angio-edema    Blood transfusion without reported diagnosis    3 units packed cells and 1 bag of iron   Chronic constipation 06/10/2012   COVID-19 04/2020   Depression    Diabetes mellitus without complication (HCC)    Dysfunctional uterine bleeding    Gallstones    GERD (gastroesophageal reflux disease) 06/10/2012   Gestational diabetes    Headache 12/09/2020   Headache 12/09/2020   High-risk pregnancy 07/13/2021   HIV (human immunodeficiency virus infection) (HCC)    HIV (human immunodeficiency virus infection) (HCC)    HIV disease (HCC) 11/01/2020   -on Biktarvy, last viral load: 07/13/21: 12,000  12/19/21: not detected   EFW @ 28, 36wks     No testing        C/S @ 38wks (VL >1K) or IOL @ 39wks (VL <1K)   HSV (herpes simplex virus) anogenital infection    Low back pain 12/09/2020   Low  back pain 12/09/2020   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   Passive suicidal ideations 11/07/2020   Passive suicidal ideations 11/07/2020   Polyhydramnios 02/13/2022   Postpartum hemorrhage 02/16/2022   Pregnancy induced hypertension    Pyelonephritis 08/19/2021   Admission 5/13-5/15   Urine cx poc 09/11/21 neg   Urticaria    Vaginal delivery 02/16/2022   Vaginal pruritus 03/15/2021   Vaginal yeast infection     Past Surgical History: Past Surgical History:  Procedure Laterality Date   NO PAST SURGERIES      Obstetrical History: OB History     Gravida  3   Para  1   Term  1   Preterm  0   AB  1   Living  1      SAB  1   IAB  0   Ectopic  0   Multiple  0   Live Births  1           Social History Social History   Socioeconomic History   Marital status: Single    Spouse name: Not on file   Number of children: Not on file   Years of education: Not on file   Highest education level: Not on file  Occupational History   Not on file  Tobacco Use   Smoking status: Former    Types: Cigars, E-cigarettes   Smokeless tobacco: Never   Tobacco comments:    vapes  Vaping Use   Vaping status: Former   Substances: Nicotine, CBD, Flavoring  Substance and Sexual Activity   Alcohol use: No   Drug use: Not Currently    Types: Marijuana    Comment: more than a year ago   Sexual activity: Yes    Partners: Male    Birth control/protection: None    Comment: declined condoms  Other Topics Concern   Not on file  Social History Narrative   Marital status/children/pets: Single.    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      Social Determinants of Health   Financial Resource Strain: Low Risk  (08/09/2021)   Overall Financial Resource Strain (CARDIA)    Difficulty of Paying Living Expenses: Not hard at all  Food Insecurity: No Food Insecurity (02/13/2022)   Hunger Vital Sign    Worried About Running Out of Food in the  Last Year: Never true    Ran  Out of Food in the Last Year: Never true  Transportation Needs: No Transportation Needs (02/13/2022)   PRAPARE - Administrator, Civil Service (Medical): No    Lack of Transportation (Non-Medical): No  Physical Activity: Inactive (08/09/2021)   Exercise Vital Sign    Days of Exercise per Week: 0 days    Minutes of Exercise per Session: 0 min  Stress: No Stress Concern Present (08/09/2021)   [employer] of Occupational Health - Occupational Stress Questionnaire    Feeling of Stress : Only a little  Social Connections: Socially Integrated (08/09/2021)   Social Connection and Isolation Panel [NHANES]    Frequency of Communication with Friends and Family: More than three times a week    Frequency of Social Gatherings with Friends and Family: Never    Attends Religious Services: 1 to 4 times per year    Active Member of [employer] or Organizations: Yes    Attends Banker Meetings: Never    Marital Status: Living with partner    Family History: Family History  Problem Relation Age of Onset   Diabetes Mother    Asthma Mother    Obesity Mother    Fibromyalgia Mother    Mental illness Mother    Other Father        not to her knowledge, re med problems   Diabetes Maternal Uncle    Asthma Maternal Grandmother    Hypertension Maternal Grandmother    Cancer Maternal Grandfather    Colon cancer Maternal Grandfather    Cancer Other    Heart disease Neg Hx     Allergies: No Known Allergies  Medications Prior to Admission  Medication Sig Dispense Refill Last Dose   Accu-Chek Softclix Lancets lancets Use as instructed QID 100 each 6    acetaminophen (TYLENOL) 500 MG tablet Take 2 tablets (1,000 mg total) by mouth every 8 (eight) hours as needed for moderate pain. 100 tablet 2    bictegravir-emtricitabine-tenofovir AF (BIKTARVY) 50-200-25 MG TABS tablet Take 1 tablet by mouth daily. 30 tablet 5    cyclobenzaprine (FLEXERIL) 10 MG tablet  Take 10 mg by mouth 3 (three) times daily as needed for muscle spasms.      ferrous sulfate 325 (65 FE) MG tablet Take 1 tablet (325 mg total) by mouth every other day. 30 tablet 3    glucose blood (ACCU-CHEK GUIDE) test strip Use as instructed QID 100 each 6    hydrOXYzine (ATARAX) 25 MG tablet Take 1 tablet (25 mg total) by mouth every 8 (eight) hours as needed for anxiety. Do not take prior to operating heavy machinery -- may cause drowsiness. 30 tablet 0    metFORMIN (GLUCOPHAGE) 500 MG tablet Take 500mg  (1 tablet) in the morning and 1000mg  (2 tablets at bedtime) 30 tablet 0    oxyCODONE-acetaminophen (PERCOCET/ROXICET) 5-325 MG tablet Take 1 tablet by mouth every 6 (six) hours as needed for moderate pain. 25 tablet 0    Prenatal MV & Min w/FA-DHA (PRENATAL GUMMIES PO) Take by mouth.      promethazine (PHENERGAN) 25 MG tablet Take 1 tablet (25 mg total) by mouth every 6 (six) hours as needed for nausea or vomiting. 30 tablet 2    sertraline (ZOLOFT) 50 MG tablet Take half a tablet daily x1 week then take 1 full tablet daily. 30 tablet 2    valACYclovir (VALTREX) 500 MG tablet Take 1 tablet (500 mg total) by mouth 2 (two) times daily. 60 tablet 6  Review of Systems   All systems reviewed and negative except as stated in HPI  Last menstrual period 04/18/2022, not currently breastfeeding. General appearance: alert, cooperative, appears stated age, and painful during contractions Lungs: clear to auscultation bilaterally Heart: regular rate and rhythm Abdomen: soft, non-tender; bowel sounds normal Pelvic: had a plaque rupture with purulent discharge from vaginal side wall, no other suspicion lesions and spec exam done by attending Dr. Alvester Morin Extremities: Denna Haggard sign is negative, no sign of DVT Presentation: cephalic Fetal monitoringBaseline: 135 bpm, Variability: Good {> 6 bpm), Accelerations: Reactive, and Decelerations: Absent Uterine activity regular q46mins      Prenatal  labs: ABO, Rh: --/--/AB POS (09/06 1810) Antibody: NEG (09/06 1810) Rubella: 2.06 (04/15 1056) RPR: Non Reactive (07/10 0920)  HBsAg: Negative (04/15 1056)  HIV: Preliminary Reactive (07/10 0920)  GBS: Positive/-- (10/18 1511)  Glucola 5.4 A1c, GDM Genetic screening  LR female, declined AFP, Horizon 4/4 neg Anatomy US fetal chest mass  Prenatal Transfer Tool  Maternal Diabetes: Yes:  Diabetes Type:  Insulin/Medication controlled Genetic Screening: Normal Maternal Ultrasounds/Referrals: Other: fetal chest mass  Fetal Ultrasounds or other Referrals:  None Maternal Substance Abuse:  No Significant Maternal Medications:  Meds include: Other: metformin Significant Maternal Lab Results:  Group B Strep positive Number of Prenatal Visits:greater than 3 verified prenatal visits Other Comments:  None  No results found for this or any previous visit (from the past 24 hour(s)).  Patient Active Problem List   Diagnosis Date Noted   Cholelithiases 01/09/2023   Abnormal fetal ultrasound 12/25/2022   GBS bacteriuria 11/24/2022   History of postpartum hemorrhage 10/31/2022   Gestational diabetes 10/19/2022   HIV (human immunodeficiency virus) risk factors complicating pregnancy 10/17/2022   Supervision of high risk pregnancy, antepartum 07/17/2022   Short interval between pregnancies affecting pregnancy, antepartum 07/17/2022   HIV disease (HCC) 01/29/2022   HSV (herpes simplex virus) anogenital infection 08/09/2021    Assessment/Plan:  Alyssa Sandoval is a 22 y.o. G3P1011 at [redacted]w[redacted]d here for SOL.   #Labor: Augmentation after antibiotics and epidural.  #Pain: IV fentanyl now, planning epidural  #FWB: Cat 1 #ID:  GBS positive, amp #MOF: bottle #MOC:Nexplanon  #A2GDM: on metformin 500mg  QAM, 1g at bedtime, EFW 56% @ 36w - CBGs q4hrs  #HIV: compliant with Biktarvy, VL 51 on 9/6 - Zidovudine 3hrs prior to delivery - Infectious disease consulted, ordered repeat viral load -  Continue home Biktarvy   #Hx HSV: compliant with suppression, no prodromal symptoms, last outbreak 44yrs ago, spec had one area of white plaque on side wall that was cultured as ruptured but not believed to be consistent with HSV lesion per attending review - f/u Viral Culture - ID added on PCR test as well  #Hx of PP hemorrhage: PP hemorrhage precautions  #Fetal Chest mass: ?pericardial effusion likely to be physiologic fluid 7/25, minimal 3mm on 8/1, difficult to see 8/22 - notify peds - MFM recommends echo after delivery    #Cholelithiases: dx in MAU, asymptomatic on admit   #?Rupture:  patient states she has been having leakage for over a week, negative ROM plus in MAU prior, spec exam with murky pink fluid with some pooling concerning for prolonged rupture, negative ferning on admission - f/u ROM plus    Mia Fernande Bras, Medical Student  01/10/2023, 2:15 PM  Evaluation and management procedures were performed by the MS3 medical student Ezekiel Slocumb under my supervision. I was immediately available for direct supervision, assistance and direction throughout  this encounter.  I also confirm that I have verified the information documented in the resident's note, and that I have also personally reperformed the pertinent components of the physical exam and all of the medical decision making activities.  I have also made any necessary editorial changes.   Mittie Bodo, MD Family Medicine - Obstetrics Fellow

## 2023-01-10 NOTE — Anesthesia Procedure Notes (Signed)
Epidural Patient location during procedure: OB Start time: 01/10/2023 3:13 PM End time: 01/10/2023 3:18 PM  Staffing Anesthesiologist: Linton Rump, MD Performed: anesthesiologist   Preanesthetic Checklist Completed: patient identified, IV checked, site marked, risks and benefits discussed, surgical consent, monitors and equipment checked, pre-op evaluation and timeout performed  Epidural Patient position: sitting Prep: DuraPrep and site prepped and draped Patient monitoring: continuous pulse ox and blood pressure Approach: midline Location: L3-L4 Injection technique: LOR saline  Needle:  Needle type: Tuohy  Needle gauge: 17 G Needle length: 9 cm and 9 Needle insertion depth: 5 cm Catheter type: closed end flexible Catheter size: 19 Gauge Catheter at skin depth: 9 cm Test dose: negative  Assessment Events: blood not aspirated, no cerebrospinal fluid, injection not painful, no injection resistance, no paresthesia and negative IV test  Additional Notes The patient has requested an epidural for labor pain management. Risks and benefits including, but not limited to, infection, bleeding, local anesthetic toxicity, headache, hypotension, back pain, block failure, etc. were discussed with the patient. The patient expressed understanding and consented to the procedure. I confirmed that the patient has no bleeding disorders and is not taking blood thinners. I confirmed the patient's last platelet count with the nurse. A time-out was performed immediately prior to the procedure. Please see nursing documentation for vital signs. Sterile technique was used throughout the whole procedure. Once LOR achieved, the epidural catheter threaded easily without resistance. Aspiration of the catheter was negative for blood and CSF. The epidural was dosed slowly and an infusion was started.  1 attempt(s)Reason for block:procedure for pain

## 2023-01-11 ENCOUNTER — Encounter: Payer: MEDICAID | Admitting: Obstetrics and Gynecology

## 2023-01-11 ENCOUNTER — Other Ambulatory Visit: Payer: MEDICAID

## 2023-01-11 ENCOUNTER — Encounter (HOSPITAL_COMMUNITY): Payer: Self-pay | Admitting: Obstetrics and Gynecology

## 2023-01-11 LAB — RPR: RPR Ser Ql: NONREACTIVE

## 2023-01-11 LAB — HIV-1 RNA QUANT-NO REFLEX-BLD
HIV 1 RNA Quant: 50 {copies}/mL
LOG10 HIV-1 RNA: 1.699 {Log}

## 2023-01-11 LAB — CBC
HCT: 27.4 % — ABNORMAL LOW (ref 36.0–46.0)
Hemoglobin: 8.7 g/dL — ABNORMAL LOW (ref 12.0–15.0)
MCH: 24.8 pg — ABNORMAL LOW (ref 26.0–34.0)
MCHC: 31.8 g/dL (ref 30.0–36.0)
MCV: 78.1 fL — ABNORMAL LOW (ref 80.0–100.0)
Platelets: 292 10*3/uL (ref 150–400)
RBC: 3.51 MIL/uL — ABNORMAL LOW (ref 3.87–5.11)
RDW: 15.2 % (ref 11.5–15.5)
WBC: 17.8 10*3/uL — ABNORMAL HIGH (ref 4.0–10.5)
nRBC: 0 % (ref 0.0–0.2)

## 2023-01-11 MED ORDER — FERROUS SULFATE 325 (65 FE) MG PO TABS
325.0000 mg | ORAL_TABLET | ORAL | Status: DC
  Administered 2023-01-11: 325 mg via ORAL
  Filled 2023-01-11 (×2): qty 1

## 2023-01-11 NOTE — Anesthesia Postprocedure Evaluation (Signed)
Anesthesia Post Note  Patient: Alyssa Sandoval  Procedure(s) Performed: AN AD HOC LABOR EPIDURAL     Patient location during evaluation: Mother Baby Anesthesia Type: Epidural Level of consciousness: awake and alert Pain management: pain level controlled Vital Signs Assessment: post-procedure vital signs reviewed and stable Respiratory status: spontaneous breathing, nonlabored ventilation and respiratory function stable Cardiovascular status: stable Postop Assessment: no headache, no backache and epidural receding Anesthetic complications: no   No notable events documented.  Last Vitals:  Vitals:   01/11/23 0230 01/11/23 0517  BP: 107/73 104/80  Pulse: 92 100  Resp: 16 16  Temp: 37.4 C 36.7 C  SpO2:      Last Pain:  Vitals:   01/11/23 0519  TempSrc:   PainSc: 0-No pain   Pain Goal:                   Alyssa Sandoval

## 2023-01-11 NOTE — Progress Notes (Signed)
Post Partum Day 1 Subjective:  Alyssa Sandoval is a 22 y.o. [id number] [redacted]w[redacted]d s/p SVD.  No acute events overnight.  Pt denies problems with ambulating, voiding or po intake.  She denies nausea or vomiting.  Pain is well controlled.  She has had flatus.  Lochia Minimal.  Plan for birth control is  Nexplanon .  Method of Feeding: formula  Objective: Blood pressure 104/80, pulse 100, temperature 98.1 F (36.7 C), temperature source Oral, resp. rate 16, last menstrual period 04/18/2022, SpO2 100%, unknown if currently breastfeeding.  Physical Exam:  General: alert, cooperative and no distress Lochia:normal flow Chest: normal WOB Heart: Regular rate Abdomen: +BS, soft, mild TTP (appropriate) Uterine Fundus: firm, at umbilcus DVT Evaluation: No evidence of DVT seen on physical exam. Extremities: No edema  Recent Labs    01/10/23 1422 01/11/23 0524  HGB 11.0* 8.7*  HCT 34.8* 27.4*    Assessment/Plan:  ASSESSMENT: Alyssa Sandoval is a 22 y.o. [id number] [redacted]w[redacted]d s/p SVD Nexplanon before discharge. SNM will plan to do later today Plan for discharge tomorrow Continue routine PP care Breastfeeding support PRN  #A2GDM - fasting BG not done today. Will get tomorrow. Will need 2hr GTT at postpartum  #HIV - continue pre pregnancy medications  #Anemia, Acute blood loss.  - Likely acute blood loss. Had 520 EBL at delivery and required Cytotec, TXA  - Started on oral fe   LOS: 1 day   Federico Flake 01/11/2023, 9:11 AM

## 2023-01-12 LAB — URINE CULTURE

## 2023-01-12 LAB — GLUCOSE, CAPILLARY: Glucose-Capillary: 92 mg/dL (ref 70–99)

## 2023-01-12 MED ORDER — IBUPROFEN 600 MG PO TABS: 600.0000 mg | ORAL_TABLET | Freq: Four times a day (QID) | ORAL | 0 refills | Status: DC

## 2023-01-12 MED ORDER — SENNOSIDES-DOCUSATE SODIUM 8.6-50 MG PO TABS: 2.0000 | ORAL_TABLET | ORAL | 0 refills | Status: AC

## 2023-01-12 MED ORDER — LIDOCAINE HCL 1 % IJ SOLN: 0.0000 mL | Freq: Once | INTRAMUSCULAR | Status: DC | PRN

## 2023-01-12 MED ORDER — ETONOGESTREL 68 MG ~~LOC~~ IMPL: 68.0000 mg | DRUG_IMPLANT | Freq: Once | SUBCUTANEOUS | Status: DC

## 2023-01-12 MED ORDER — MEDROXYPROGESTERONE ACETATE 150 MG/ML IM SUSP
150.0000 mg | Freq: Once | INTRAMUSCULAR | Status: AC
  Administered 2023-01-12: 150 mg via INTRAMUSCULAR
  Filled 2023-01-12: qty 1

## 2023-01-12 NOTE — Progress Notes (Signed)
CSW acknowledged consult and completed a clinical assessment.  There are no barriers to d/c.  Clinical assessment notes will be entered at a later time.   Follow up visits for the infant is scheduled for October 11th 2pm at [facility].  Enos Fling, [facility] Clinical Social Worker [phone]

## 2023-01-13 LAB — HSV CULTURE AND TYPING

## 2023-01-15 ENCOUNTER — Other Ambulatory Visit: Payer: MEDICAID

## 2023-01-15 ENCOUNTER — Encounter: Payer: MEDICAID | Admitting: Obstetrics & Gynecology

## 2023-01-15 NOTE — Social Work (Signed)
CSW received a consul for Specialty care of HIV exposed newborn. CSW met with MOB to offer support and complete assessment. CSW entered the room and observed MOB in bed resting and holding the infant. CSW introduced self, CSW role and reason for visit, MOB was agreeable to assessment. CSW inquired abut how MOB was feeling, MOB reported good. CSW inquired about any MH hx, MOB denied MH concerns or PPD. CSW provided education regarding the baby blues period vs. perinatal mood disorders, discussed treatment and gave resources for mental health follow up if concerns arise.  CSW recommends self-evaluation during the postpartum time period using the New Mom Checklist from Postpartum Progress and encouraged MOB to contact a medical professional if symptoms are noted at any time.    CSW inquired about where MOB wanted infant to be followed for Infectious Disease, MOB reported [facility]. CSW arranged an appointment with [facility] for 10/11 @ 2pm. CSW completed and faxed referral from for [employer].   CSW provided review of Sudden Infant Death Syndrome (SIDS) precautions.  MOB identified [facility] for infants follow up. MOB reported she has all necessary items for the infant including a bassinet and car seat. CSW identifies no further need for intervention and no barriers to discharge at this time.  Wende Neighbors, LCSWA Clinical Social Worker [phone]

## 2023-01-16 ENCOUNTER — Other Ambulatory Visit (HOSPITAL_COMMUNITY): Payer: Self-pay

## 2023-01-16 ENCOUNTER — Inpatient Hospital Stay (HOSPITAL_COMMUNITY): Payer: MEDICAID

## 2023-01-31 ENCOUNTER — Ambulatory Visit: Payer: MEDICAID | Admitting: General Surgery

## 2023-02-05 ENCOUNTER — Telehealth (HOSPITAL_COMMUNITY): Payer: Self-pay | Admitting: *Deleted

## 2023-02-05 NOTE — Telephone Encounter (Signed)
02/05/2023  Name: Alyssa Sandoval MRN: [id number] DOB: 06-09-2000  Reason for Call:  Transition of Care Hospital Discharge Call  Contact Status: Patient Contact Status: Complete  Language assistant needed: Interpreter Mode: Interpreter Not Needed        Follow-Up Questions: Do You Have Any Concerns About Your Health As You Heal From Delivery?: No Do You Have Any Concerns About Your Infants Health?: No  Edinburgh Postnatal Depression Scale:  In the Past 7 Days:    PHQ2-9 Depression Scale:     Discharge Follow-up: Edinburgh score requires follow up?:  (declines screening today, says she has completed screening several times already with no concerns, endorses she is feeling well emotionally) Patient was advised of the following resources:: Support Group (declines postpartum group information via email)  Post-discharge interventions: Reviewed Newborn Safe Sleep Practices  Salena Saner, RN 02/05/2023 11:44

## 2023-02-14 ENCOUNTER — Other Ambulatory Visit: Payer: MEDICAID

## 2023-02-14 ENCOUNTER — Ambulatory Visit: Payer: MEDICAID | Admitting: Women's Health

## 2023-02-14 DIAGNOSIS — Z8632 Personal history of gestational diabetes: Secondary | ICD-10-CM

## 2023-02-14 DIAGNOSIS — Z131 Encounter for screening for diabetes mellitus: Secondary | ICD-10-CM

## 2023-05-02 ENCOUNTER — Ambulatory Visit: Payer: MEDICAID | Admitting: Family

## 2023-05-03 ENCOUNTER — Ambulatory Visit: Payer: MEDICAID | Admitting: Allergy & Immunology

## 2023-07-01 ENCOUNTER — Ambulatory Visit: Payer: MEDICAID | Admitting: Family

## 2023-07-18 ENCOUNTER — Other Ambulatory Visit: Payer: Self-pay

## 2023-07-18 ENCOUNTER — Other Ambulatory Visit: Payer: MEDICAID

## 2023-07-18 ENCOUNTER — Ambulatory Visit: Payer: MEDICAID | Admitting: Pharmacist

## 2023-07-18 ENCOUNTER — Other Ambulatory Visit (HOSPITAL_COMMUNITY)
Admission: RE | Admit: 2023-07-18 | Discharge: 2023-07-18 | Disposition: A | Discharge status: Home / Self Care | Payer: MEDICAID | Source: Ambulatory Visit | Attending: Family | Admitting: Family

## 2023-07-18 DIAGNOSIS — Z113 Encounter for screening for infections with a predominantly sexual mode of transmission: Secondary | ICD-10-CM | POA: Diagnosis present

## 2023-07-18 DIAGNOSIS — B20 Human immunodeficiency virus [HIV] disease: Secondary | ICD-10-CM

## 2023-07-19 LAB — URINE CYTOLOGY ANCILLARY ONLY
Chlamydia: NEGATIVE
Comment: NEGATIVE
Comment: NORMAL
Neisseria Gonorrhea: NEGATIVE

## 2023-07-19 LAB — T-HELPER CELL (CD4) - (RCID CLINIC ONLY)
CD4 % Helper T Cell: 43 % (ref 33–65)
CD4 T Cell Abs: 1176 /uL (ref 400–1790)

## 2023-07-21 LAB — CBC WITH DIFFERENTIAL/PLATELET
Absolute Lymphocytes: 2747 {cells}/uL (ref 850–3900)
Absolute Monocytes: 491 {cells}/uL (ref 200–950)
Basophils Absolute: 19 {cells}/uL (ref 0–200)
Basophils Relative: 0.3 %
Eosinophils Absolute: 38 {cells}/uL (ref 15–500)
Eosinophils Relative: 0.6 %
HCT: 34.9 % — ABNORMAL LOW (ref 35.0–45.0)
Hemoglobin: 11.3 g/dL — ABNORMAL LOW (ref 11.7–15.5)
MCH: 24.9 pg — ABNORMAL LOW (ref 27.0–33.0)
MCHC: 32.4 g/dL (ref 32.0–36.0)
MCV: 76.9 fL — ABNORMAL LOW (ref 80.0–100.0)
MPV: 9.1 fL (ref 7.5–12.5)
Monocytes Relative: 7.8 %
Neutro Abs: 3005 {cells}/uL (ref 1500–7800)
Neutrophils Relative %: 47.7 %
Platelets: 369 10*3/uL (ref 140–400)
RBC: 4.54 10*6/uL (ref 3.80–5.10)
RDW: 15.8 % — ABNORMAL HIGH (ref 11.0–15.0)
Total Lymphocyte: 43.6 %
WBC: 6.3 10*3/uL (ref 3.8–10.8)

## 2023-07-21 LAB — COMPLETE METABOLIC PANEL WITHOUT GFR
AG Ratio: 1.6 (calc) (ref 1.0–2.5)
ALT: 15 U/L (ref 6–29)
AST: 14 U/L (ref 10–30)
Albumin: 4.2 g/dL (ref 3.6–5.1)
Alkaline phosphatase (APISO): 77 U/L (ref 31–125)
BUN: 13 mg/dL (ref 7–25)
CO2: 23 mmol/L (ref 20–32)
Calcium: 9.1 mg/dL (ref 8.6–10.2)
Chloride: 108 mmol/L (ref 98–110)
Creat: 0.56 mg/dL (ref 0.50–0.96)
Globulin: 2.7 g/dL (ref 1.9–3.7)
Glucose, Bld: 88 mg/dL (ref 65–99)
Potassium: 4 mmol/L (ref 3.5–5.3)
Sodium: 138 mmol/L (ref 135–146)
Total Bilirubin: 0.2 mg/dL (ref 0.2–1.2)
Total Protein: 6.9 g/dL (ref 6.1–8.1)

## 2023-07-21 LAB — HIV-1 RNA QUANT-NO REFLEX-BLD
HIV 1 RNA Quant: NOT DETECTED {copies}/mL
HIV-1 RNA Quant, Log: NOT DETECTED {Log_copies}/mL

## 2023-07-21 LAB — RPR: RPR Ser Ql: NONREACTIVE

## 2023-07-30 NOTE — Progress Notes (Unsigned)
 HPI: Alyssa Sandoval is a 23 y.o. female who presents to the [facility] pharmacy clinic for HIV follow-up.  Patient Active Problem List   Diagnosis Date Noted   Cholelithiases 01/09/2023   Abnormal fetal ultrasound 12/25/2022   GBS bacteriuria 11/24/2022   History of postpartum hemorrhage 10/31/2022   Gestational diabetes 10/19/2022   HIV (human immunodeficiency virus) risk factors complicating pregnancy 10/17/2022   Supervision of high risk pregnancy, antepartum 07/17/2022   Short interval between pregnancies affecting pregnancy, antepartum 07/17/2022   HIV disease (HCC) 01/29/2022   HSV (herpes simplex virus) anogenital infection 08/09/2021    Patient's Medications  New Prescriptions   No medications on file  Previous Medications   ACETAMINOPHEN  (TYLENOL ) 500 MG TABLET    Take 2 tablets (1,000 mg total) by mouth every 8 (eight) hours as needed for moderate pain.   BICTEGRAVIR-EMTRICITABINE -TENOFOVIR  AF (BIKTARVY ) 50-200-25 MG TABS TABLET    Take 1 tablet by mouth daily.   CYCLOBENZAPRINE  (FLEXERIL ) 10 MG TABLET    Take 10 mg by mouth 3 (three) times daily as needed for muscle spasms.   FERROUS SULFATE  325 (65 FE) MG TABLET    Take 1 tablet (325 mg total) by mouth every other day.   HYDROXYZINE  (ATARAX ) 25 MG TABLET    Take 1 tablet (25 mg total) by mouth every 8 (eight) hours as needed for anxiety. Do not take prior to operating heavy machinery -- may cause drowsiness.   IBUPROFEN  (ADVIL ) 600 MG TABLET    Take 1 tablet (600 mg total) by mouth every 6 (six) hours.   PRENATAL MV & MIN W/FA-DHA (PRENATAL GUMMIES PO)    Take by mouth.   PROMETHAZINE  (PHENERGAN ) 25 MG TABLET    Take 1 tablet (25 mg total) by mouth every 6 (six) hours as needed for nausea or vomiting.   SENNA-DOCUSATE (SENOKOT-S) 8.6-50 MG TABLET    Take 2 tablets by mouth daily.   SERTRALINE  (ZOLOFT ) 50 MG TABLET    Take half a tablet daily x1 week then take 1 full tablet daily.  Modified Medications   No medications on  file  Discontinued Medications   No medications on file    Labs: Lab Results  Component Value Date   HIV1RNAQUANT NOT DETECTED 07/18/2023   HIV1RNAQUANT 50 01/10/2023   HIV1RNAQUANT 51 (H) 12/14/2022   HIV1RNAVL 6,790 07/23/2022   CD4TABS 1,176 07/18/2023   CD4TABS 877 12/19/2021   CD4TABS 1,071 10/18/2021    RPR and STI Lab Results  Component Value Date   LABRPR NON-REACTIVE 07/18/2023   LABRPR NON REACTIVE 01/10/2023   LABRPR Non Reactive 10/17/2022   LABRPR Non Reactive 07/23/2022   LABRPR NON REACTIVE 02/13/2022    STI Results GC CT  07/18/2023  1:23 PM Negative  Negative   12/25/2022  4:27 PM Negative  Negative   11/22/2022  1:28 PM Negative  Negative   09/05/2022 12:00 PM Negative  Negative   07/23/2022 10:25 AM Negative  Negative   01/24/2022 10:36 AM Negative  Negative   01/13/2022  1:29 AM Negative  Negative   12/12/2021 10:18 AM Negative  Negative   08/09/2021  3:58 PM Negative  Negative   05/02/2021  8:55 AM Negative  Negative   03/15/2021  9:57 AM Negative    Negative    Negative  Negative    Negative    Negative   10/19/2020  2:19 PM Negative  Negative   08/22/2018 12:00 AM Negative  Negative     Hepatitis  B Lab Results  Component Value Date   HEPBSAG Negative 07/23/2022   Hepatitis C Lab Results  Component Value Date   HEPCAB NON-REACTIVE 11/07/2020   Hepatitis A No results found for: "HAV" Lipids: Lab Results  Component Value Date   CHOL 174 07/13/2021   TRIG 69 07/13/2021   HDL 57 07/13/2021   CHOLHDL 3.1 07/13/2021   LDLCALC 101 (H) 07/13/2021    Current HIV Regimen: Biktarvy   Assessment: Last RNA undetectable on 07/18/2022. *** Adherence/fill history (nothing since July 2024)  Pregnant? *** tdap    Plan: -  -  -   Kristopher Pheasant PharmD Candidate

## 2023-07-31 ENCOUNTER — Ambulatory Visit (INDEPENDENT_AMBULATORY_CARE_PROVIDER_SITE_OTHER): Payer: MEDICAID | Admitting: Pharmacist

## 2023-07-31 ENCOUNTER — Other Ambulatory Visit: Payer: Self-pay

## 2023-07-31 DIAGNOSIS — B20 Human immunodeficiency virus [HIV] disease: Secondary | ICD-10-CM

## 2023-07-31 MED ORDER — BIKTARVY 50-200-25 MG PO TABS: 1.0000 | ORAL_TABLET | Freq: Every day | ORAL | 5 refills | Status: AC

## 2023-08-17 ENCOUNTER — Encounter (HOSPITAL_COMMUNITY): Payer: Self-pay | Admitting: *Deleted

## 2023-08-17 ENCOUNTER — Emergency Department (HOSPITAL_COMMUNITY)
Admission: EM | Admit: 2023-08-17 | Discharge: 2023-08-17 | Disposition: A | Discharge status: Home / Self Care | Payer: MEDICAID | Attending: Emergency Medicine | Admitting: Emergency Medicine

## 2023-08-17 ENCOUNTER — Other Ambulatory Visit: Payer: Self-pay

## 2023-08-17 DIAGNOSIS — N898 Other specified noninflammatory disorders of vagina: Secondary | ICD-10-CM | POA: Diagnosis present

## 2023-08-17 DIAGNOSIS — Z21 Asymptomatic human immunodeficiency virus [HIV] infection status: Secondary | ICD-10-CM | POA: Insufficient documentation

## 2023-08-17 DIAGNOSIS — B9689 Other specified bacterial agents as the cause of diseases classified elsewhere: Secondary | ICD-10-CM | POA: Diagnosis not present

## 2023-08-17 DIAGNOSIS — N760 Acute vaginitis: Secondary | ICD-10-CM | POA: Diagnosis not present

## 2023-08-17 DIAGNOSIS — N3000 Acute cystitis without hematuria: Secondary | ICD-10-CM | POA: Insufficient documentation

## 2023-08-17 LAB — URINALYSIS, ROUTINE W REFLEX MICROSCOPIC
Bilirubin Urine: NEGATIVE
Glucose, UA: NEGATIVE mg/dL
Hgb urine dipstick: NEGATIVE
Ketones, ur: 5 mg/dL — AB
Nitrite: POSITIVE — AB
Protein, ur: 30 mg/dL — AB
Specific Gravity, Urine: 1.029 (ref 1.005–1.030)
pH: 5 (ref 5.0–8.0)

## 2023-08-17 LAB — WET PREP, GENITAL
Sperm: NONE SEEN
Trich, Wet Prep: NONE SEEN
WBC, Wet Prep HPF POC: >=10 — AB (ref <10)
Yeast Wet Prep HPF POC: NONE SEEN

## 2023-08-17 LAB — PREGNANCY, URINE: Preg Test, Ur: NEGATIVE

## 2023-08-17 MED ORDER — CEPHALEXIN 500 MG PO CAPS: 500.0000 mg | ORAL_CAPSULE | Freq: Three times a day (TID) | ORAL | 0 refills | Status: DC

## 2023-08-17 MED ORDER — METRONIDAZOLE 500 MG PO TABS
500.0000 mg | ORAL_TABLET | Freq: Two times a day (BID) | ORAL | 0 refills | Status: DC
Start: 2023-08-17 — End: 2024-01-02

## 2023-08-17 NOTE — ED Triage Notes (Signed)
 The pt has an abscess on her lt labia that she has had for 2 weeks   no temp lmp last momth

## 2023-08-17 NOTE — ED Provider Notes (Signed)
 [place] EMERGENCY DEPARTMENT AT [facility] Provider Note   CSN: [id number] Arrival date & time: 08/17/23  0002     History  Chief Complaint  Patient presents with   Abscess    Alyssa Sandoval is a 23 y.o. female.  The history is provided by the patient.  Pt with  history of HIV presents with concern for labial abscess.  She reports for the past 2 weeks she has had some swelling around the left side of her labia with minimal pain.  No fevers or vomiting.  She reports vaginal discharge.  No vaginal bleeding. She reports having this previously but never required surgery Denies any painful bowel movements    Home Medications Prior to Admission medications   Medication Sig Start Date End Date Taking? Authorizing Provider  cephALEXin  (KEFLEX ) 500 MG capsule Take 1 capsule (500 mg total) by mouth 3 (three) times daily. 08/17/23  Yes Eldon Greenland, MD  metroNIDAZOLE  (FLAGYL ) 500 MG tablet Take 1 tablet (500 mg total) by mouth 2 (two) times daily. One po bid x 7 days 08/17/23  Yes Eldon Greenland, MD  acetaminophen  (TYLENOL ) 500 MG tablet Take 2 tablets (1,000 mg total) by mouth every 8 (eight) hours as needed for moderate pain. 11/22/22 11/22/23  Maud Sorenson, MD  bictegravir-emtricitabine -tenofovir  AF (BIKTARVY ) 50-200-25 MG TABS tablet Take 1 tablet by mouth daily. 07/31/23   Kuppelweiser, Cassie L, RPH-CPP  ferrous sulfate  325 (65 FE) MG tablet Take 1 tablet (325 mg total) by mouth every other day. 12/14/22   Ebony Goldstein, MD  ibuprofen  (ADVIL ) 600 MG tablet Take 1 tablet (600 mg total) by mouth every 6 (six) hours. 01/12/23   Kumar, Agnijita, MD  Prenatal MV & Min w/FA-DHA (PRENATAL GUMMIES PO) Take by mouth.    [provider]  senna-docusate (SENOKOT-S) 8.6-50 MG tablet Take 2 tablets by mouth daily. 01/12/23   Kumar, Agnijita, MD  sertraline  (ZOLOFT ) 50 MG tablet Take half a tablet daily x1 week then take 1 full tablet daily. 12/15/22   Agatha Alcon,  CNM  norelgestromin -ethinyl estradiol  (ORTHO EVRA) 150-35 MCG/24HR transdermal patch Place 1 patch onto the skin once a week. 10/22/18 11/09/19  Napolean Backbone A, DO      Allergies    Patient has no known allergies.    Review of Systems   Review of Systems  Genitourinary:  Positive for vaginal discharge. Negative for vaginal bleeding.    Physical Exam Updated Vital Signs BP 111/82 (BP Location: Right Arm)   Pulse 86   Temp 98.5 F (36.9 C) (Oral)   Resp 16   Ht 1.524 m (5')   Wt 81.6 kg   LMP 07/18/2023   SpO2 100%   BMI 35.13 kg/m  Physical Exam CONSTITUTIONAL: Well developed/well nourished, sitting up in a chair yelling into the phone on arrival to the room HEAD: Normocephalic/atraumatic ABDOMEN: soft, nontender, no rebound or guarding, bowel sounds noted throughout abdomen GU pelvic exam performed with nurse Winda Hastings present Whitish discharge noted.  No vaginal bleeding.  There is no obvious Bartholin cyst.  Mild induration noted to the left labia but no fluctuant abscess.  No crepitus No no rashes noted NEURO: Pt is awake/alert/appropriate, moves all extremitiesx4.  No facial droop.   SKIN: warm, color normal PSYCH: Anxious  ED Results / Procedures / Treatments   Labs (all labs ordered are listed, but only abnormal results are displayed) Labs Reviewed  WET PREP, GENITAL - Abnormal; Notable for the following  components:      Result Value   Clue Cells Wet Prep HPF POC PRESENT (*)    WBC, Wet Prep HPF POC >=10 (*)    All other components within normal limits  URINALYSIS, ROUTINE W REFLEX MICROSCOPIC - Abnormal; Notable for the following components:   Color, Urine AMBER (*)    APPearance CLOUDY (*)    Ketones, ur 5 (*)    Protein, ur 30 (*)    Nitrite POSITIVE (*)    Leukocytes,Ua LARGE (*)    Bacteria, UA MANY (*)    All other components within normal limits  PREGNANCY, URINE  GC/CHLAMYDIA PROBE AMP () NOT AT [facility]    EKG None  Radiology No results  found.  Procedures Procedures    Medications Ordered in ED Medications - No data to display  ED Course/ Medical Decision Making/ A&P Clinical Course as of 08/17/23 0616  Sat Aug 17, 2023  0616 Patient presented for what she thought was an abscess.  There was a mild area of induration but no fluctuant abscess, no overlying erythema or crepitus.  Patient also mention dysuria and vaginal discharge.  Patient found to have a UTI as well as bacterial vaginosis.  Flagyl  and Keflex  have been sent and advised her to follow with her gynecologist [DW]    Clinical Course User Index [DW] Eldon Greenland, MD                                 Medical Decision Making Amount and/or Complexity of Data Reviewed Labs: ordered.  Risk Prescription drug management.           Final Clinical Impression(s) / ED Diagnoses Final diagnoses:  Acute cystitis without hematuria  Bacterial vaginosis    Rx / DC Orders ED Discharge Orders          Ordered    metroNIDAZOLE  (FLAGYL ) 500 MG tablet  2 times daily        08/17/23 0611    cephALEXin  (KEFLEX ) 500 MG capsule  3 times daily        08/17/23 5284              Eldon Greenland, MD 08/17/23 [phone]

## 2023-08-19 LAB — GC/CHLAMYDIA PROBE AMP (~~LOC~~) NOT AT ARMC
Chlamydia: NEGATIVE
Comment: NEGATIVE
Comment: NORMAL
Neisseria Gonorrhea: NEGATIVE

## 2023-11-14 ENCOUNTER — Ambulatory Visit: Payer: MEDICAID | Admitting: Family

## 2023-12-12 ENCOUNTER — Ambulatory Visit: Payer: MEDICAID | Admitting: Family

## 2024-01-02 ENCOUNTER — Emergency Department (HOSPITAL_COMMUNITY)
Admission: EM | Admit: 2024-01-02 | Discharge: 2024-01-02 | Disposition: A | Discharge status: Home / Self Care | Payer: MEDICAID | Attending: Emergency Medicine | Admitting: Emergency Medicine

## 2024-01-02 ENCOUNTER — Encounter (HOSPITAL_COMMUNITY): Payer: Self-pay

## 2024-01-02 ENCOUNTER — Other Ambulatory Visit: Payer: Self-pay

## 2024-01-02 DIAGNOSIS — Z3201 Encounter for pregnancy test, result positive: Secondary | ICD-10-CM | POA: Insufficient documentation

## 2024-01-02 DIAGNOSIS — Z87891 Personal history of nicotine dependence: Secondary | ICD-10-CM | POA: Diagnosis not present

## 2024-01-02 DIAGNOSIS — B9689 Other specified bacterial agents as the cause of diseases classified elsewhere: Secondary | ICD-10-CM | POA: Diagnosis not present

## 2024-01-02 DIAGNOSIS — N760 Acute vaginitis: Secondary | ICD-10-CM | POA: Insufficient documentation

## 2024-01-02 DIAGNOSIS — N898 Other specified noninflammatory disorders of vagina: Secondary | ICD-10-CM | POA: Diagnosis present

## 2024-01-02 DIAGNOSIS — R1909 Other intra-abdominal and pelvic swelling, mass and lump: Secondary | ICD-10-CM

## 2024-01-02 DIAGNOSIS — Z21 Asymptomatic human immunodeficiency virus [HIV] infection status: Secondary | ICD-10-CM | POA: Insufficient documentation

## 2024-01-02 LAB — COMPREHENSIVE METABOLIC PANEL WITH GFR
ALT: 38 U/L (ref 0–44)
AST: 18 U/L (ref 15–41)
Albumin: 3.5 g/dL (ref 3.5–5.0)
Alkaline Phosphatase: 75 U/L (ref 38–126)
Anion gap: 11 (ref 5–15)
BUN: 11 mg/dL (ref 6–20)
CO2: 19 mmol/L — ABNORMAL LOW (ref 22–32)
Calcium: 9.1 mg/dL (ref 8.9–10.3)
Chloride: 104 mmol/L (ref 98–111)
Creatinine, Ser: 0.62 mg/dL (ref 0.44–1.00)
GFR, Estimated: >60 mL/min (ref >60)
Glucose, Bld: 113 mg/dL — ABNORMAL HIGH (ref 70–99)
Potassium: 3.7 mmol/L (ref 3.5–5.1)
Sodium: 134 mmol/L — ABNORMAL LOW (ref 135–145)
Total Bilirubin: 0.4 mg/dL (ref 0.0–1.2)
Total Protein: 6.9 g/dL (ref 6.5–8.1)

## 2024-01-02 LAB — CBC WITH DIFFERENTIAL/PLATELET
Abs Immature Granulocytes: 0.02 K/uL (ref 0.00–0.07)
Basophils Absolute: 0 K/uL (ref 0.0–0.1)
Basophils Relative: 0 %
Eosinophils Absolute: 0.1 K/uL (ref 0.0–0.5)
Eosinophils Relative: 0 %
HCT: 36.9 % (ref 36.0–46.0)
Hemoglobin: 11.7 g/dL — ABNORMAL LOW (ref 12.0–15.0)
Immature Granulocytes: 0 %
Lymphocytes Relative: 30 %
Lymphs Abs: 3.6 K/uL (ref 0.7–4.0)
MCH: 24.5 pg — ABNORMAL LOW (ref 26.0–34.0)
MCHC: 31.7 g/dL (ref 30.0–36.0)
MCV: 77.4 fL — ABNORMAL LOW (ref 80.0–100.0)
Monocytes Absolute: 0.7 K/uL (ref 0.1–1.0)
Monocytes Relative: 6 %
Neutro Abs: 7.8 K/uL — ABNORMAL HIGH (ref 1.7–7.7)
Neutrophils Relative %: 64 %
Platelets: 359 K/uL (ref 150–400)
RBC: 4.77 MIL/uL (ref 3.87–5.11)
RDW: 17.3 % — ABNORMAL HIGH (ref 11.5–15.5)
WBC: 12.3 K/uL — ABNORMAL HIGH (ref 4.0–10.5)
nRBC: 0 % (ref 0.0–0.2)

## 2024-01-02 LAB — URINALYSIS, W/ REFLEX TO CULTURE (INFECTION SUSPECTED)
Bilirubin Urine: NEGATIVE
Glucose, UA: NEGATIVE mg/dL
Hgb urine dipstick: NEGATIVE
Ketones, ur: NEGATIVE mg/dL
Nitrite: NEGATIVE
Protein, ur: NEGATIVE mg/dL
Specific Gravity, Urine: 1.024 (ref 1.005–1.030)
pH: 6 (ref 5.0–8.0)

## 2024-01-02 LAB — WET PREP, GENITAL
Sperm: NONE SEEN
Trich, Wet Prep: NONE SEEN
WBC, Wet Prep HPF POC: >=10 — AB (ref <10)
Yeast Wet Prep HPF POC: NONE SEEN

## 2024-01-02 LAB — I-STAT CG4 LACTIC ACID, ED: Lactic Acid, Venous: 0.8 mmol/L (ref 0.5–1.9)

## 2024-01-02 LAB — GC/CHLAMYDIA PROBE AMP (~~LOC~~) NOT AT ARMC
Chlamydia: NEGATIVE
Comment: NEGATIVE
Comment: NORMAL
Neisseria Gonorrhea: NEGATIVE

## 2024-01-02 LAB — HCG, SERUM, QUALITATIVE: Preg, Serum: POSITIVE — AB

## 2024-01-02 LAB — HCG, QUANTITATIVE, PREGNANCY: hCG, Beta Chain, Quant, S: 827 m[IU]/mL — ABNORMAL HIGH (ref <5)

## 2024-01-02 MED ORDER — METRONIDAZOLE 500 MG PO TABS: 500.0000 mg | ORAL_TABLET | Freq: Two times a day (BID) | ORAL | 0 refills | Status: DC

## 2024-01-02 MED ORDER — AMOXICILLIN-POT CLAVULANATE 875-125 MG PO TABS: 1.0000 | ORAL_TABLET | Freq: Two times a day (BID) | ORAL | 0 refills | Status: DC

## 2024-01-02 NOTE — ED Triage Notes (Signed)
 Pt c/o bump on right labia that started swelling approximately a week ago. Pt states she feels like it is infected, has felt feverish and nauseated.

## 2024-01-02 NOTE — Discharge Instructions (Addendum)
 You had a positive pregnancy test today.  It is very important that you schedule a follow-up appoint with OB/GYN for repeat lab work and prenatal care/ultrasound as needed. We discussed possible incision and drainage of the sore area in the right labia.  You elected to go with conservative therapy at this time.  Please use warm compresses over the area.  Take the prescribed antibiotics until complete.  If this does not improve you may still need incision and drainage You were also diagnosed this evening with bacterial vaginosis.  Antibiotics called metronidazole  have been sent for treatment. If you develop any life-threatening symptoms please return immediately to the emergency department.  Otherwise please follow-up with primary care and OB/GYN for further evaluation and management.

## 2024-01-02 NOTE — ED Provider Notes (Signed)
 [place] EMERGENCY DEPARTMENT AT [facility] Provider Note   CSN: [id number] Arrival date & time: 01/02/24  9661     Patient presents with: Groin Swelling   Alyssa Sandoval is a 23 y.o. female.  Patient presents to the emergency room complaining of a bump on the right outer labia which began swelling over the past few days.  Patient also complains of some white vaginal discharge.  She denies dysuria, abdominal pain, nausea, vomiting, fever. She is sexually active and does not endorse using protection or birth control.    HPI     Prior to Admission medications   Medication Sig Start Date End Date Taking? Authorizing Provider  amoxicillin -clavulanate (AUGMENTIN ) 875-125 MG tablet Take 1 tablet by mouth every 12 (twelve) hours. 01/02/24  Yes Logan Ubaldo NOVAK, PA-C  metroNIDAZOLE  (FLAGYL ) 500 MG tablet Take 1 tablet (500 mg total) by mouth 2 (two) times daily. 01/02/24  Yes Logan Ubaldo NOVAK, PA-C  bictegravir-emtricitabine -tenofovir  AF (BIKTARVY ) 50-200-25 MG TABS tablet Take 1 tablet by mouth daily. 07/31/23   Kuppelweiser, Cassie L, RPH-CPP  cephALEXin  (KEFLEX ) 500 MG capsule Take 1 capsule (500 mg total) by mouth 3 (three) times daily. 08/17/23   Midge Golas, MD  ferrous sulfate  325 (65 FE) MG tablet Take 1 tablet (325 mg total) by mouth every other day. 12/14/22   Jhonny Augustin BROCKS, MD  ibuprofen  (ADVIL ) 600 MG tablet Take 1 tablet (600 mg total) by mouth every 6 (six) hours. 01/12/23   Kumar, Agnijita, MD  Prenatal MV & Min w/FA-DHA (PRENATAL GUMMIES PO) Take by mouth.    [provider]  senna-docusate (SENOKOT-S) 8.6-50 MG tablet Take 2 tablets by mouth daily. 01/12/23   Kumar, Agnijita, MD  sertraline  (ZOLOFT ) 50 MG tablet Take half a tablet daily x1 week then take 1 full tablet daily. 12/15/22   Marylen Aleck HERO, CNM  norelgestromin -ethinyl estradiol  (ORTHO EVRA) 150-35 MCG/24HR transdermal patch Place 1 patch onto the skin once a week. 10/22/18 11/09/19  Catherine Fuller A, DO    Allergies: Patient has no known allergies.    Review of Systems  Updated Vital Signs BP 119/77 (BP Location: Right Arm)   Pulse (!) 107   Temp 98.8 F (37.1 C)   Resp 18   Ht 5' (1.524 m)   Wt 81.6 kg   LMP  (LMP Unknown)   SpO2 99%   BMI 35.15 kg/m   Physical Exam Vitals and nursing note reviewed. Exam conducted with a chaperone present.  Constitutional:      General: She is not in acute distress.    Appearance: She is well-developed.  HENT:     Head: Normocephalic and atraumatic.  Eyes:     Conjunctiva/sclera: Conjunctivae normal.  Cardiovascular:     Rate and Rhythm: Normal rate and regular rhythm.     Heart sounds: No murmur heard. Pulmonary:     Effort: Pulmonary effort is normal. No respiratory distress.     Breath sounds: Normal breath sounds.  Abdominal:     Palpations: Abdomen is soft.     Tenderness: There is no abdominal tenderness.  Genitourinary:    Comments: Hard area noted to right outer labia with no fluctuance, no induration or overlying erythema. No vaginal discharge noted.  Musculoskeletal:        General: No swelling.     Cervical back: Neck supple.  Skin:    General: Skin is warm and dry.     Capillary Refill: Capillary refill takes  less than 2 seconds.  Neurological:     Mental Status: She is alert.  Psychiatric:        Mood and Affect: Mood normal.     (all labs ordered are listed, but only abnormal results are displayed) Labs Reviewed  WET PREP, GENITAL - Abnormal; Notable for the following components:      Result Value   Clue Cells Wet Prep HPF POC PRESENT (*)    WBC, Wet Prep HPF POC >=10 (*)    All other components within normal limits  COMPREHENSIVE METABOLIC PANEL WITH GFR - Abnormal; Notable for the following components:   Sodium 134 (*)    CO2 19 (*)    Glucose, Bld 113 (*)    All other components within normal limits  CBC WITH DIFFERENTIAL/PLATELET - Abnormal; Notable for the following components:   WBC  12.3 (*)    Hemoglobin 11.7 (*)    MCV 77.4 (*)    MCH 24.5 (*)    RDW 17.3 (*)    Neutro Abs 7.8 (*)    All other components within normal limits  URINALYSIS, W/ REFLEX TO CULTURE (INFECTION SUSPECTED) - Abnormal; Notable for the following components:   APPearance CLOUDY (*)    Leukocytes,Ua MODERATE (*)    Bacteria, UA FEW (*)    All other components within normal limits  HCG, SERUM, QUALITATIVE - Abnormal; Notable for the following components:   Preg, Serum POSITIVE (*)    All other components within normal limits  HCG, QUANTITATIVE, PREGNANCY - Abnormal; Notable for the following components:   hCG, Beta Chain, Quant, S 827 (*)    All other components within normal limits  I-STAT CG4 LACTIC ACID, ED  GC/CHLAMYDIA PROBE AMP ([place]) NOT AT [facility]    EKG: None  Radiology: No results found.   Procedures   Medications Ordered in the ED - No data to display                                  Medical Decision Making Amount and/or Complexity of Data Reviewed Labs: ordered.   This patient presents to the ED for concern of vaginal bump, this involves an extensive number of treatment options, and is a complaint that carries with it a high risk of complications and morbidity.  The differential diagnosis includes cyst, signs, inflamed hair follicle, others   Co morbidities / Chronic conditions that complicate the patient evaluation  HSV, HIV   Additional history obtained:  Additional history obtained from EMR   Lab Tests:  I Ordered, and personally interpreted labs.  The pertinent results include: Positive pregnancy test, clue cells on wet prep   Social Determinants of Health:  Patient is a former smoker   Test / Admission - Considered:  Patient with hard lump on the right labia on the outer labia.  No fluctuance or induration to suggest fluid collection.  Mild tenderness to palpation.  I discussed possible I&D with the patient but the patient declined at  this time which is reasonable.  Plan to treat conservatively with warm compresses and antibiotics.  Patient understands that she may still need I&D.  The patient voices understanding. I informed the patient about her positive pregnancy test.  The patient states that she has seen OB/GYN approximately 1 year ago after the birth of her last child and plans to follow-up with the same OB/GYN.  I explained that is important  for follow-up testing to trend her hCG levels and to schedule prenatal care as needed.  The patient voiced understanding.  She may also follow-up with OB/GYN for further evaluation of her painful labia. Wet prep was positive for clue cells.  Plan to treat with course of metronidazole .  Augmentin  prescribed for treatment of possible infection in the labial region.  No indication for further emergent workup or admission at this time.  Patient appears stable for discharge home.      Final diagnoses:  Groin swelling  BV (bacterial vaginosis)  Positive pregnancy test    ED Discharge Orders          Ordered    amoxicillin -clavulanate (AUGMENTIN ) 875-125 MG tablet  Every 12 hours        01/02/24 0539    metroNIDAZOLE  (FLAGYL ) 500 MG tablet  2 times daily        01/02/24 0539               Logan Ubaldo NOVAK, PA-C 01/02/24 9385    Bari Charmaine FALCON, MD 01/06/24 [phone]

## 2024-01-03 ENCOUNTER — Inpatient Hospital Stay (HOSPITAL_COMMUNITY)
Admission: AD | Admit: 2024-01-03 | Discharge: 2024-01-03 | Disposition: A | Discharge status: Home / Self Care | Payer: MEDICAID | Attending: Obstetrics & Gynecology | Admitting: Obstetrics & Gynecology

## 2024-01-03 ENCOUNTER — Encounter (HOSPITAL_COMMUNITY): Payer: Self-pay | Admitting: *Deleted

## 2024-01-03 DIAGNOSIS — N751 Abscess of Bartholin's gland: Secondary | ICD-10-CM | POA: Diagnosis not present

## 2024-01-03 DIAGNOSIS — N750 Cyst of Bartholin's gland: Secondary | ICD-10-CM | POA: Diagnosis not present

## 2024-01-03 DIAGNOSIS — O26892 Other specified pregnancy related conditions, second trimester: Secondary | ICD-10-CM

## 2024-01-03 DIAGNOSIS — Z21 Asymptomatic human immunodeficiency virus [HIV] infection status: Secondary | ICD-10-CM | POA: Insufficient documentation

## 2024-01-03 DIAGNOSIS — O98712 Human immunodeficiency virus [HIV] disease complicating pregnancy, second trimester: Secondary | ICD-10-CM | POA: Insufficient documentation

## 2024-01-03 DIAGNOSIS — R102 Pelvic and perineal pain: Secondary | ICD-10-CM | POA: Diagnosis present

## 2024-01-03 DIAGNOSIS — O3482 Maternal care for other abnormalities of pelvic organs, second trimester: Secondary | ICD-10-CM | POA: Diagnosis not present

## 2024-01-03 DIAGNOSIS — Z3A14 14 weeks gestation of pregnancy: Secondary | ICD-10-CM | POA: Diagnosis not present

## 2024-01-03 DIAGNOSIS — Z79624 Long term (current) use of inhibitors of nucleotide synthesis: Secondary | ICD-10-CM | POA: Insufficient documentation

## 2024-01-03 MED ORDER — HYDROMORPHONE HCL 1 MG/ML IJ SOLN
1.0000 mg | INTRAMUSCULAR | Status: DC | PRN
  Administered 2024-01-03: 1 mg via INTRAVENOUS
  Filled 2024-01-03: qty 1

## 2024-01-03 MED ORDER — LIDOCAINE HCL (PF) 1 % IJ SOLN
30.0000 mL | Freq: Once | INTRAMUSCULAR | Status: AC
  Administered 2024-01-03: 30 mL
  Filled 2024-01-03: qty 30

## 2024-01-03 MED ORDER — SULFAMETHOXAZOLE-TRIMETHOPRIM 800-160 MG PO TABS: 1.0000 | ORAL_TABLET | Freq: Two times a day (BID) | ORAL | 0 refills | Status: AC

## 2024-01-03 MED ORDER — LACTATED RINGERS IV BOLUS
1000.0000 mL | Freq: Once | INTRAVENOUS | Status: AC
  Administered 2024-01-03: 1000 mL via INTRAVENOUS

## 2024-01-03 MED ORDER — ONDANSETRON HCL 4 MG/2ML IJ SOLN
4.0000 mg | Freq: Once | INTRAMUSCULAR | Status: AC
  Administered 2024-01-03: 4 mg via INTRAVENOUS
  Filled 2024-01-03: qty 2

## 2024-01-03 NOTE — MAU Provider Note (Signed)
 Chief Complaint:  Vaginal Pain   HPI   Event Date/Time   First Provider Initiated Contact with Patient 01/03/24 1000      Alyssa Sandoval is a 23 y.o. [phone] at Unknown who presents to maternity admissions reporting a painful lump on the right side of her vagina that started last week.  Patient was seen in the ED yesterday and reports that the provider was uncomfortable performing I&D.  She was started on Augmentin  at that time and presents today complaining that she is unable to walk, sit, and it is continuing to grow causing her severe pain requesting incision and drainage   Patient is [redacted] weeks pregnant with a pregnancy complicated by HIV currently on Biktarvy  and followed by infectious disease.  Pregnancy Course: un established  Past Medical History:  Diagnosis Date   Anemia    Angio-edema    Blood transfusion without reported diagnosis    3 units packed cells and 1 bag of iron   Chronic constipation 06/10/2012   COVID-19 04/2020   Depression    Diabetes mellitus without complication (HCC)    Dysfunctional uterine bleeding    Gallstones    GERD (gastroesophageal reflux disease) 06/10/2012   Gestational diabetes    Headache 12/09/2020   Headache 12/09/2020   High-risk pregnancy 07/13/2021   HIV (human immunodeficiency virus infection) (HCC)    HIV (human immunodeficiency virus infection) (HCC)    HIV disease (HCC) 11/01/2020   -on Biktarvy , last viral load: 07/13/21: 12,000  12/19/21: not detected   EFW @ 28, 36wks     No testing        C/S @ 38wks (VL >1K) or IOL @ 39wks (VL <1K)   HSV (herpes simplex virus) anogenital infection    Low back pain 12/09/2020   Low back pain 12/09/2020   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   Passive suicidal ideations 11/07/2020   Passive suicidal ideations 11/07/2020   Polyhydramnios 02/13/2022   Postpartum hemorrhage 02/16/2022   Pregnancy induced hypertension    Pyelonephritis 08/19/2021   Admission 5/13-5/15   Urine cx poc 09/11/21 neg    Urticaria    Vaginal delivery 02/16/2022   Vaginal pruritus 03/15/2021   Vaginal yeast infection    OB History  Gravida Para Term Preterm AB Living  4 2 2  0 1 2  SAB IAB Ectopic Multiple Live Births  1 0 0 0 2    # Outcome Date GA Lbr Len/2nd Weight Sex Type Anes PTL Lv  4 Current           3 Term 01/10/23 [redacted]w[redacted]d / 00:09 3120 g F Vag-Spont EPI  LIV  2 Term 02/14/22 [redacted]w[redacted]d 04:49 / 03:39 3220 g M Vag-Spont EPI  LIV  1 SAB 2020           Past Surgical History:  Procedure Laterality Date   NO PAST SURGERIES     Family History  Problem Relation Age of Onset   Diabetes Mother    Asthma Mother    Obesity Mother    Fibromyalgia Mother    Mental illness Mother    Other Father        not to her knowledge, re med problems   Diabetes Maternal Uncle    Asthma Maternal Grandmother    Hypertension Maternal Grandmother    Cancer Maternal Grandfather    Colon cancer Maternal Grandfather    Cancer Other    Heart disease Neg Hx    Social History   Tobacco Use  Smoking status: Former    Types: Cigars, E-cigarettes   Smokeless tobacco: Never   Tobacco comments:    vapes  Vaping Use   Vaping status: Former   Substances: Nicotine, CBD, Flavoring  Substance Use Topics   Alcohol use: No   Drug use: Not Currently    Types: Marijuana    Comment: more than a year ago   No Known Allergies Medications Prior to Admission  Medication Sig Dispense Refill Last Dose/Taking   amoxicillin -clavulanate (AUGMENTIN ) 875-125 MG tablet Take 1 tablet by mouth every 12 (twelve) hours. 14 tablet 0    bictegravir-emtricitabine -tenofovir  AF (BIKTARVY ) 50-200-25 MG TABS tablet Take 1 tablet by mouth daily. 30 tablet 5    cephALEXin  (KEFLEX ) 500 MG capsule Take 1 capsule (500 mg total) by mouth 3 (three) times daily. 221 capsule 0    ferrous sulfate  325 (65 FE) MG tablet Take 1 tablet (325 mg total) by mouth every other day. 30 tablet 3    ibuprofen  (ADVIL ) 600 MG tablet Take 1 tablet (600 mg total) by  mouth every 6 (six) hours. 30 tablet 0    metroNIDAZOLE  (FLAGYL ) 500 MG tablet Take 1 tablet (500 mg total) by mouth 2 (two) times daily. 14 tablet 0    Prenatal MV & Min w/FA-DHA (PRENATAL GUMMIES PO) Take by mouth.      senna-docusate (SENOKOT-S) 8.6-50 MG tablet Take 2 tablets by mouth daily. 60 tablet 0    sertraline  (ZOLOFT ) 50 MG tablet Take half a tablet daily x1 week then take 1 full tablet daily. 30 tablet 2     I have reviewed patient's Past Medical Hx, Surgical Hx, Family Hx, Social Hx, medications and allergies.   ROS  Pertinent items noted in HPI and remainder of comprehensive ROS otherwise negative.   PHYSICAL EXAM  Patient Vitals for the past 24 hrs:  BP Temp Pulse Resp  01/03/24 0944 130/82 98.3 F (36.8 C) 97 18    Constitutional: Well-developed, well-nourished female in no acute distress, visually appears to be in pain Cardiovascular: normal rate & rhythm, warm and well-perfused Respiratory: normal effort, no problems with respiration noted GI: Abd soft, non-tender, gravid MS: Extremities nontender, no edema, normal ROM Neurologic: Alert and oriented x 4.   Pelvic:  Exam Chaperoned by Nathanel Blush , RN   Lg Right Bartholin Cyst palpated on the right ~ 6 cm in size , very painful to touch      Labs: No results found for this or any previous visit (from the past 24 hours).  Imaging:  No results found.  MDM & MAU COURSE  MDM:  HIGH  Prenatal chart reviewed Physical exam performed Right Bartholin cyst approximately 6 cm in size palpated during pelvic exam I&D performed-see procedure note below Prescription for antibiotics sent Sitz bath's with Epsom salts advised Patient tolerated the procedure well will prepare prepare for discharge   Patient was given the options of expectant management with Antibiotics and Sitz baths vs I/D of the abscess. After S/R/B discussed with patient she opted for the I/D procedure. Pain medication ordered prior to the  procedure and consent obtained prior.  Procedure assisted by Nathanel Blush , RN  Bartholin Cyst Incision & Drainage Procedure Note Enlarged Right  abscess palpated in front of the hymenal ring at 7 o'clock.  Written informed consent was obtained.  Discussed complications and possible outcomes of procedure including recurrence of cyst, scarring leading to infection, bleeding, dyspareunia, distortion of anatomy.  Patient was examined in the  dorsal lithotomy position and mass was identified.  The area was prepped with Iodine and draped in a sterile manner. 1% Lidocaine  (3 ml) was then used to infiltrate area on top of the cyst, behind the hymenal ring.  A 1 mm incision was made using a sterile scapel. Upon palpation of the mass, a heavy amount of pus drainage was expressed through the incision. A hemostat was used to break up loculations, which resulted in expression of more bloody purulent drainage.  Cultures were sent.   MAU Course: Orders Placed This Encounter  Procedures   Aerobic/Anaerobic Culture w Gram Stain (surgical/deep wound)   Discharge patient Discharge disposition: 01-Home or Self Care; Discharge patient date: 01/03/2024   Meds ordered this encounter  Medications   lactated ringers  bolus 1,000 mL   ondansetron  (ZOFRAN ) injection 4 mg   HYDROmorphone  (DILAUDID ) injection 1 mg   lidocaine  (PF) (XYLOCAINE ) 1 % injection 30 mL   sulfamethoxazole -trimethoprim  (BACTRIM  DS) 800-160 MG tablet    Sig: Take 1 tablet by mouth 2 (two) times daily for 7 days.    Dispense:  14 tablet    Refill:  0    Supervising Provider:   PRATT, TANYA S [[id number]]     I have reviewed the patient chart and performed the physical exam . Medications ordered as stated above/below.  A/P as described below.  Counseling and education provided and patient agreeable  with plan as described below. Verbalized understanding.    ASSESSMENT   1. Abscess of right Bartholin gland   2. [redacted] weeks gestation of pregnancy      PLAN  Discharge home in stable condition with return precautions.   F/U with [facility] Provider   See AVS for full description of information given to the patient including both verbal and written. Patient verbalized understanding and agrees with the plan as described above.      Allergies as of 01/03/2024   No Known Allergies      Medication List     STOP taking these medications    amoxicillin -clavulanate 875-125 MG tablet Commonly known as: AUGMENTIN    cephALEXin  500 MG capsule Commonly known as: KEFLEX    ibuprofen  600 MG tablet Commonly known as: ADVIL        TAKE these medications    Biktarvy  50-200-25 MG Tabs tablet Generic drug: bictegravir-emtricitabine -tenofovir  AF Take 1 tablet by mouth daily.   ferrous sulfate  325 (65 FE) MG tablet Take 1 tablet (325 mg total) by mouth every other day.   metroNIDAZOLE  500 MG tablet Commonly known as: FLAGYL  Take 1 tablet (500 mg total) by mouth 2 (two) times daily.   PRENATAL GUMMIES PO Take by mouth.   senna-docusate 8.6-50 MG tablet Commonly known as: Senokot-S Take 2 tablets by mouth daily.   sertraline  50 MG tablet Commonly known as: ZOLOFT  Take half a tablet daily x1 week then take 1 full tablet daily.   sulfamethoxazole -trimethoprim  800-160 MG tablet Commonly known as: BACTRIM  DS Take 1 tablet by mouth 2 (two) times daily for 7 days.        Olam Dalton, MSN, [facility], Center for [facility]    This chart was dictated using voice recognition software, Dragon. Despite the best efforts of this provider to proofread and correct errors, errors may still occur which can change documentation meaning.

## 2024-01-03 NOTE — MAU Note (Signed)
 Alyssa Sandoval is a 23 y.o. at Unknown here in MAU reporting: c/o a knot on her vagina. That started last week. Was seen in office yesterday reports provider was not comfortable draining it. Was too hard did not think it would drain anything. Pt requwsting pain management. She can't sit. The area is much bigger today  LMP: unsure Onset of complaint: 1 week  Pain score: 10  There were no vitals filed for this visit.   FHT:   Lab orders placed from triage:

## 2024-01-06 LAB — AEROBIC CULTURE W GRAM STAIN (SUPERFICIAL SPECIMEN)

## 2024-01-15 ENCOUNTER — Other Ambulatory Visit: Payer: Self-pay | Admitting: Obstetrics & Gynecology

## 2024-01-15 DIAGNOSIS — O09899 Supervision of other high risk pregnancies, unspecified trimester: Secondary | ICD-10-CM

## 2024-01-15 DIAGNOSIS — Z789 Other specified health status: Secondary | ICD-10-CM

## 2024-01-20 ENCOUNTER — Ambulatory Visit (INDEPENDENT_AMBULATORY_CARE_PROVIDER_SITE_OTHER): Payer: MEDICAID | Admitting: Radiology

## 2024-01-20 DIAGNOSIS — O09899 Supervision of other high risk pregnancies, unspecified trimester: Secondary | ICD-10-CM

## 2024-01-20 DIAGNOSIS — O30041 Twin pregnancy, dichorionic/diamniotic, first trimester: Secondary | ICD-10-CM

## 2024-01-20 DIAGNOSIS — O3680X0 Pregnancy with inconclusive fetal viability, not applicable or unspecified: Secondary | ICD-10-CM

## 2024-01-20 DIAGNOSIS — Z789 Other specified health status: Secondary | ICD-10-CM

## 2024-01-20 DIAGNOSIS — Z3A01 Less than 8 weeks gestation of pregnancy: Secondary | ICD-10-CM

## 2024-01-20 NOTE — Progress Notes (Signed)
 Anteverted uterus with early viable di-di twin gestation = [redacted]w[redacted]d  unknown LMP,  EDC by US  = 09-08-2024 Twin A's sac is lower Right with CRL = 9.1 mm = [redacted]w[redacted]d, FHR = 132 bpm, YS = 3.3 mm, Twin B's sac is upper left with CRL = 8.7 mm = [redacted]w[redacted]d, FHR = 119 bpm, YS = 3.8 mm - both sacs intact within upper ut cavity.  Normal Rt ov with CL = 16 x 13 mm, Left ovary is seen with cystic avascular serpiginous tubular structure medial to left ovary = 41 x 15 x 21 mm, likely hydrosalpinx left tube. No free fluid seen - neg CDS

## 2024-01-22 ENCOUNTER — Telehealth: Payer: Self-pay | Admitting: Women's Health

## 2024-01-22 ENCOUNTER — Other Ambulatory Visit: Payer: Self-pay | Admitting: Women's Health

## 2024-01-22 ENCOUNTER — Other Ambulatory Visit: Payer: Self-pay

## 2024-01-22 DIAGNOSIS — O219 Vomiting of pregnancy, unspecified: Secondary | ICD-10-CM

## 2024-01-22 MED ORDER — PROMETHAZINE HCL 25 MG PO TABS: 12.5000 mg | ORAL_TABLET | Freq: Four times a day (QID) | ORAL | 6 refills | Status: AC | PRN

## 2024-01-22 MED ORDER — DOXYLAMINE-PYRIDOXINE 10-10 MG PO TBEC: DELAYED_RELEASE_TABLET | ORAL | 6 refills | Status: DC

## 2024-01-22 MED ORDER — PROMETHAZINE HCL 25 MG PO TABS: 12.5000 mg | ORAL_TABLET | Freq: Four times a day (QID) | ORAL | 6 refills | Status: DC | PRN

## 2024-01-22 NOTE — Telephone Encounter (Signed)
 Patient is experiencing vomiting and can't go to the bathroom. She hasn't ate anything, nothing stays down. Please advise.

## 2024-01-23 ENCOUNTER — Other Ambulatory Visit: Payer: Self-pay

## 2024-01-23 ENCOUNTER — Inpatient Hospital Stay (HOSPITAL_COMMUNITY): Payer: MEDICAID

## 2024-01-23 ENCOUNTER — Inpatient Hospital Stay (HOSPITAL_COMMUNITY)
Admission: AD | Admit: 2024-01-23 | Discharge: 2024-01-24 | Disposition: A | Discharge status: Home / Self Care | Payer: MEDICAID | Attending: Obstetrics and Gynecology | Admitting: Obstetrics and Gynecology

## 2024-01-23 DIAGNOSIS — O30001 Twin pregnancy, unspecified number of placenta and unspecified number of amniotic sacs, first trimester: Secondary | ICD-10-CM | POA: Insufficient documentation

## 2024-01-23 DIAGNOSIS — O210 Mild hyperemesis gravidarum: Secondary | ICD-10-CM | POA: Diagnosis not present

## 2024-01-23 DIAGNOSIS — K8020 Calculus of gallbladder without cholecystitis without obstruction: Secondary | ICD-10-CM | POA: Diagnosis not present

## 2024-01-23 DIAGNOSIS — Z3A01 Less than 8 weeks gestation of pregnancy: Secondary | ICD-10-CM | POA: Insufficient documentation

## 2024-01-23 DIAGNOSIS — O99891 Other specified diseases and conditions complicating pregnancy: Secondary | ICD-10-CM | POA: Insufficient documentation

## 2024-01-23 DIAGNOSIS — O26611 Liver and biliary tract disorders in pregnancy, first trimester: Secondary | ICD-10-CM | POA: Diagnosis not present

## 2024-01-23 DIAGNOSIS — K5909 Other constipation: Secondary | ICD-10-CM | POA: Diagnosis not present

## 2024-01-23 DIAGNOSIS — R109 Unspecified abdominal pain: Secondary | ICD-10-CM | POA: Diagnosis present

## 2024-01-23 LAB — COMPREHENSIVE METABOLIC PANEL WITH GFR
ALT: 73 U/L — ABNORMAL HIGH (ref 0–44)
AST: 28 U/L (ref 15–41)
Albumin: 3.7 g/dL (ref 3.5–5.0)
Alkaline Phosphatase: 65 U/L (ref 38–126)
Anion gap: 10 (ref 5–15)
BUN: 8 mg/dL (ref 6–20)
CO2: 20 mmol/L — ABNORMAL LOW (ref 22–32)
Calcium: 8.9 mg/dL (ref 8.9–10.3)
Chloride: 102 mmol/L (ref 98–111)
Creatinine, Ser: 0.4 mg/dL — ABNORMAL LOW (ref 0.44–1.00)
GFR, Estimated: >60 mL/min (ref >60)
Glucose, Bld: 87 mg/dL (ref 70–99)
Potassium: 3.3 mmol/L — ABNORMAL LOW (ref 3.5–5.1)
Sodium: 132 mmol/L — ABNORMAL LOW (ref 135–145)
Total Bilirubin: 0.5 mg/dL (ref 0.0–1.2)
Total Protein: 7.1 g/dL (ref 6.5–8.1)

## 2024-01-23 LAB — CBC WITH DIFFERENTIAL/PLATELET
Abs Immature Granulocytes: 0.02 K/uL (ref 0.00–0.07)
Basophils Absolute: 0.1 K/uL (ref 0.0–0.1)
Basophils Relative: 1 %
Eosinophils Absolute: 0 K/uL (ref 0.0–0.5)
Eosinophils Relative: 0 %
HCT: 35.9 % — ABNORMAL LOW (ref 36.0–46.0)
Hemoglobin: 11.8 g/dL — ABNORMAL LOW (ref 12.0–15.0)
Immature Granulocytes: 0 %
Lymphocytes Relative: 31 %
Lymphs Abs: 2.9 K/uL (ref 0.7–4.0)
MCH: 25.3 pg — ABNORMAL LOW (ref 26.0–34.0)
MCHC: 32.9 g/dL (ref 30.0–36.0)
MCV: 77 fL — ABNORMAL LOW (ref 80.0–100.0)
Monocytes Absolute: 0.6 K/uL (ref 0.1–1.0)
Monocytes Relative: 6 %
Neutro Abs: 5.8 K/uL (ref 1.7–7.7)
Neutrophils Relative %: 62 %
Platelets: 337 K/uL (ref 150–400)
RBC: 4.66 MIL/uL (ref 3.87–5.11)
RDW: 16.6 % — ABNORMAL HIGH (ref 11.5–15.5)
WBC: 9.4 K/uL (ref 4.0–10.5)
nRBC: 0 % (ref 0.0–0.2)

## 2024-01-23 LAB — URINALYSIS, ROUTINE W REFLEX MICROSCOPIC
Bilirubin Urine: NEGATIVE
Glucose, UA: NEGATIVE mg/dL
Hgb urine dipstick: NEGATIVE
Ketones, ur: 20 mg/dL — AB
Nitrite: NEGATIVE
Protein, ur: NEGATIVE mg/dL
Specific Gravity, Urine: 1.028 (ref 1.005–1.030)
pH: 5 (ref 5.0–8.0)

## 2024-01-23 LAB — WET PREP, GENITAL
Sperm: NONE SEEN
Trich, Wet Prep: NONE SEEN
WBC, Wet Prep HPF POC: >=10 — AB
Yeast Wet Prep HPF POC: NONE SEEN

## 2024-01-23 MED ORDER — ONDANSETRON 4 MG PO TBDP
4.0000 mg | ORAL_TABLET | Freq: Once | ORAL | Status: AC
Start: 2024-01-23 — End: 2024-01-23
  Administered 2024-01-23: 4 mg via ORAL
  Filled 2024-01-23: qty 1

## 2024-01-23 MED ORDER — ONDANSETRON HCL 4 MG/2ML IJ SOLN: 4.0000 mg | Freq: Once | INTRAMUSCULAR | Status: DC

## 2024-01-23 MED ORDER — LACTATED RINGERS IV BOLUS
1000.0000 mL | Freq: Once | INTRAVENOUS | Status: AC
  Administered 2024-01-23: 1000 mL via INTRAVENOUS

## 2024-01-23 MED ORDER — SMOG ENEMA
960.0000 mL | Freq: Once | RECTAL | Status: AC
  Administered 2024-01-23: 960 mL via RECTAL
  Filled 2024-01-23: qty 960

## 2024-01-23 MED ORDER — GLYCOPYRROLATE 1 MG PO TABS
1.0000 mg | ORAL_TABLET | Freq: Once | ORAL | Status: AC
  Administered 2024-01-23: 1 mg via ORAL
  Filled 2024-01-23: qty 1

## 2024-01-23 NOTE — MAU Note (Signed)
 Awaiting IV team. Provider aware.

## 2024-01-23 NOTE — MAU Provider Note (Cosign Needed Addendum)
 Chief Complaint:  Emesis  HPI   Event Date/Time   First Provider Initiated Contact with Patient 01/23/24 1938     Alyssa Sandoval is a 23 y.o. [id number] at [redacted]w[redacted]d with twin pregnancy who presents to maternity admissions reporting nausea and vomiting. She contacted the office yesterday with new complaint of N/V beginning about 1 week ago. The office prescribed promethazine  which she picked up this morning. She has attempted to take this twice with immediate vomiting following. She has vomited 2x today. While waiting in the waiting area she began having severe abdominal pain (rates 8/10) originating in upper abdomen. Pain is radiating to RLQ. Describes pain as sharp and stabbing, was constant while sitting, but now that she is laying down, pain is intermittent. Reports history of constipation prior to pregnancy. Last bowel movement a few days ago, prior to that it was 1 full week between bowel movements. Describes small amount of hard stool with each bowel movement. She has a history of gallstones in prior pregnancy, with plans to have gallbladder removed after delivery. Has discomfort with urination at urethra, vaginal itching, and yellow vaginal discharge. Denies vaginal bleeding.  Pregnancy Course: initial prenatal visit scheduled for 02/26/24 at [facility], complicated by HIV- on Biktarvy   Past Medical History:  Diagnosis Date   Anemia    Angio-edema    Blood transfusion without reported diagnosis    3 units packed cells and 1 bag of iron   Chronic constipation 06/10/2012   COVID-19 04/2020   Depression    Diabetes mellitus without complication (HCC)    Dysfunctional uterine bleeding    Gallstones    GERD (gastroesophageal reflux disease) 06/10/2012   Gestational diabetes    Headache 12/09/2020   Headache 12/09/2020   High-risk pregnancy 07/13/2021   HIV (human immunodeficiency virus infection) (HCC)    HIV (human immunodeficiency virus infection) (HCC)    HIV disease (HCC)  11/01/2020   -on Biktarvy , last viral load: 07/13/21: 12,000  12/19/21: not detected   EFW @ 28, 36wks     No testing        C/S @ 38wks (VL >1K) or IOL @ 39wks (VL <1K)   HSV (herpes simplex virus) anogenital infection    Low back pain 12/09/2020   Low back pain 12/09/2020   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   Passive suicidal ideations 11/07/2020   Passive suicidal ideations 11/07/2020   Polyhydramnios 02/13/2022   Postpartum hemorrhage 02/16/2022   Pregnancy induced hypertension    Pyelonephritis 08/19/2021   Admission 5/13-5/15   Urine cx poc 09/11/21 neg   Urticaria    Vaginal delivery 02/16/2022   Vaginal pruritus 03/15/2021   Vaginal yeast infection    OB History  Gravida Para Term Preterm AB Living  4 2 2  0 1 2  SAB IAB Ectopic Multiple Live Births  1 0 0 0 2    # Outcome Date GA Lbr Len/2nd Weight Sex Type Anes PTL Lv  4 Current           3 Term 01/10/23 [redacted]w[redacted]d / 00:09 6 lb 14.1 oz (3.12 kg) F Vag-Spont EPI  LIV  2 Term 02/14/22 [redacted]w[redacted]d 04:49 / 03:39 7 lb 1.6 oz (3.22 kg) M Vag-Spont EPI  LIV  1 SAB 2020           Past Surgical History:  Procedure Laterality Date   NO PAST SURGERIES     Family History  Problem Relation Age of Onset   Diabetes Mother    Asthma  Mother    Obesity Mother    Fibromyalgia Mother    Mental illness Mother    Other Father        not to her knowledge, re med problems   Diabetes Maternal Uncle    Asthma Maternal Grandmother    Hypertension Maternal Grandmother    Cancer Maternal Grandfather    Colon cancer Maternal Grandfather    Cancer Other    Heart disease Neg Hx    Social History   Tobacco Use   Smoking status: Former    Types: Cigars, E-cigarettes   Smokeless tobacco: Never   Tobacco comments:    vapes  Vaping Use   Vaping status: Former   Substances: Nicotine, CBD, Flavoring  Substance Use Topics   Alcohol use: No   Drug use: Not Currently    Types: Marijuana    Comment: more than a year ago   No Known  Allergies Medications Prior to Admission  Medication Sig Dispense Refill Last Dose/Taking   bictegravir-emtricitabine -tenofovir  AF (BIKTARVY ) 50-200-25 MG TABS tablet Take 1 tablet by mouth daily. 30 tablet 5    Doxylamine-Pyridoxine (DICLEGIS) 10-10 MG TBEC 2 tabs q hs, if sx persist add 1 tab q am on day 3, if sx persist add 1 tab q afternoon on day 4 100 tablet 6    ferrous sulfate  325 (65 FE) MG tablet Take 1 tablet (325 mg total) by mouth every other day. 30 tablet 3    metroNIDAZOLE  (FLAGYL ) 500 MG tablet Take 1 tablet (500 mg total) by mouth 2 (two) times daily. 14 tablet 0    Prenatal MV & Min w/FA-DHA (PRENATAL GUMMIES PO) Take by mouth.      promethazine  (PHENERGAN ) 25 MG tablet Take 0.5-1 tablets (12.5-25 mg total) by mouth every 6 (six) hours as needed. 30 tablet 6    senna-docusate (SENOKOT-S) 8.6-50 MG tablet Take 2 tablets by mouth daily. 60 tablet 0    sertraline  (ZOLOFT ) 50 MG tablet Take half a tablet daily x1 week then take 1 full tablet daily. 30 tablet 2    I have reviewed patient's Past Medical Hx, Surgical Hx, Family Hx, Social Hx, medications and allergies.   ROS  Pertinent items noted in HPI and remainder of comprehensive ROS otherwise negative.   PHYSICAL EXAM  Patient Vitals for the past 24 hrs:  BP Temp Temp src Pulse Resp SpO2 Weight  01/23/24 1823 119/82 98.6 F (37 C) Oral 69 16 100 % 178 lb (80.7 kg)   Constitutional: Well-developed, well-nourished female in no acute distress Cardiovascular: normal rate, warm and well-perfused Respiratory: normal effort, no problems with respiration noted GI: Abd soft, non-distended, tender with palpation over entire abdomen, rebound tenderness in RLQ MS: Extremities nontender, no edema, normal ROM Neurologic: Alert and oriented x 4 Pelvic: deferred    Labs: Results for orders placed or performed during the hospital encounter of 01/23/24 (from the past 24 hours)  Urinalysis, Routine w reflex microscopic -Urine, Clean  Catch     Status: Abnormal   Collection Time: 01/23/24  6:43 PM  Result Value Ref Range   Color, Urine YELLOW YELLOW   APPearance HAZY (A) CLEAR   Specific Gravity, Urine 1.028 1.005 - 1.030   pH 5.0 5.0 - 8.0   Glucose, UA NEGATIVE NEGATIVE mg/dL   Hgb urine dipstick NEGATIVE NEGATIVE   Bilirubin Urine NEGATIVE NEGATIVE   Ketones, ur 20 (A) NEGATIVE mg/dL   Protein, ur NEGATIVE NEGATIVE mg/dL   Nitrite NEGATIVE NEGATIVE  Leukocytes,Ua TRACE (A) NEGATIVE   RBC / HPF 0-5 0 - 5 RBC/hpf   WBC, UA 6-10 0 - 5 WBC/hpf   Bacteria, UA RARE (A) NONE SEEN   Squamous Epithelial / HPF 11-20 0 - 5 /HPF   Mucus PRESENT   CBC with Differential/Platelet     Status: Abnormal   Collection Time: 01/23/24  8:36 PM  Result Value Ref Range   WBC 9.4 4.0 - 10.5 K/uL   RBC 4.66 3.87 - 5.11 MIL/uL   Hemoglobin 11.8 (L) 12.0 - 15.0 g/dL   HCT 64.0 (L) 63.9 - 53.9 %   MCV 77.0 (L) 80.0 - 100.0 fL   MCH 25.3 (L) 26.0 - 34.0 pg   MCHC 32.9 30.0 - 36.0 g/dL   RDW 83.3 (H) 88.4 - 84.4 %   Platelets 337 150 - 400 K/uL   nRBC 0.0 0.0 - 0.2 %   Neutrophils Relative % 62 %   Neutro Abs 5.8 1.7 - 7.7 K/uL   Lymphocytes Relative 31 %   Lymphs Abs 2.9 0.7 - 4.0 K/uL   Monocytes Relative 6 %   Monocytes Absolute 0.6 0.1 - 1.0 K/uL   Eosinophils Relative 0 %   Eosinophils Absolute 0.0 0.0 - 0.5 K/uL   Basophils Relative 1 %   Basophils Absolute 0.1 0.0 - 0.1 K/uL   Immature Granulocytes 0 %   Abs Immature Granulocytes 0.02 0.00 - 0.07 K/uL   Imaging:  No results found.  MDM & MAU COURSE  MDM: Moderate  MAU Course: Orders Placed This Encounter  Procedures   Wet prep, genital   MR ABDOMEN WO CONTRAST   MR PELVIS WO CONTRAST   Urinalysis, Routine w reflex microscopic -Urine, Clean Catch   CBC with Differential/Platelet   Comprehensive metabolic panel   Insert peripheral IV   Meds ordered this encounter  Medications   lactated ringers  bolus 1,000 mL   glycopyrrolate (ROBINUL) tablet 1 mg    DISCONTD: ondansetron  (ZOFRAN ) injection 4 mg   sorbitol, magnesium hydroxide, mineral oil, glycerin  (SMOG) enema   ondansetron  (ZOFRAN -ODT) disintegrating tablet 4 mg   - Ketones present in urine, emesis x 2 today, unable to tolerate PO intake > IV start and LR bolus ordered - Difficulty with IV start so Zofran  ODT ordered with Robinul for excessive saliva, IV team consulted by nursing staff - Given severe abdominal pain, N/V, and rebound tenderness in RLQ there is concern for appendicitis, MR abdomen/pelvis ordered, CMP+CBC ordered - SMOG enema ordered for constipation, to be administered after MRI - Wet prep to be collected by patient  Care turned over to Olam Dalton, [facility] at 2100   ASSESSMENT   1. Other constipation   2. [redacted] weeks gestation of pregnancy     PLAN       Vernell Ruddle, [facility] 01/23/2024 9:13 PM   Attestation of Supervision of Student:  I confirm that I have verified the information documented in the nurse midwife student's note and that I have also personally supervised the history, physical exam and all medical decision making activities.  I have verified that all services and findings are accurately documented in this student's note; and I agree with management and plan as outlined in the documentation. I have also made any necessary editorial changes.  [name] DELENA Rote, CNM Center for [employer], [facility] 01/23/2024 9:14 PM    -Patient able to tolerate PO intake and sent home with Zofran  and miralax . Educated patient on hyperemesis  gravidarum in detail. Anticipatory guidance provided. -Stable to discharge home -Strict return precautions provided.  Barkley Angles, MD OB Fellow, [facility], Center for [employer]

## 2024-01-23 NOTE — MAU Note (Signed)
 Alyssa Sandoval is a 23 y.o. at [redacted]w[redacted]d here in MAU reporting: vomiting since last week and abd. Preg with twins and HA Pt reports no BM in a couple of days.  LMP: - Onset of complaint: week ago Pain score: 8/10-- more on the left Vitals:   01/23/24 1823  BP: 119/82  Pulse: 69  Resp: 16  Temp: 98.6 F (37 C)  SpO2: 100%     FHT: na  Lab orders placed from triage: ua

## 2024-01-23 NOTE — Discharge Instructions (Addendum)
 Alyssa Sandoval,  It was a pleasure meeting you in MAU today.  We are sending Miralax  for your constipation, this can be taken daily as needed. We are also sending Zofran  4mg  which you can dissolve under your tongue, it can make you constipated, so that's another reason to take the Miralax  regularly. We are also sending robinul to help ease the spitting that is troubling you.   Thank you for trusting us  to care for you, [name], Midwife

## 2024-01-24 MED ORDER — ONDANSETRON 4 MG PO TBDP: 4.0000 mg | ORAL_TABLET | Freq: Three times a day (TID) | ORAL | 6 refills | Status: DC | PRN

## 2024-01-24 MED ORDER — POLYETHYLENE GLYCOL 3350 17 GM/SCOOP PO POWD: 17.0000 g | Freq: Every day | ORAL | 4 refills | Status: AC

## 2024-02-03 ENCOUNTER — Encounter: Payer: Self-pay | Admitting: Advanced Practice Midwife

## 2024-02-12 ENCOUNTER — Encounter: Payer: Self-pay | Admitting: Advanced Practice Midwife

## 2024-02-12 ENCOUNTER — Ambulatory Visit: Payer: MEDICAID | Admitting: Advanced Practice Midwife

## 2024-02-12 VITALS — BP 125/83 | HR 103 | Ht 60.0 in | Wt 176.0 lb

## 2024-02-12 DIAGNOSIS — O99341 Other mental disorders complicating pregnancy, first trimester: Secondary | ICD-10-CM

## 2024-02-12 DIAGNOSIS — M25551 Pain in right hip: Secondary | ICD-10-CM

## 2024-02-12 DIAGNOSIS — F32A Depression, unspecified: Secondary | ICD-10-CM

## 2024-02-12 DIAGNOSIS — O23591 Infection of other part of genital tract in pregnancy, first trimester: Secondary | ICD-10-CM | POA: Diagnosis not present

## 2024-02-12 DIAGNOSIS — O219 Vomiting of pregnancy, unspecified: Secondary | ICD-10-CM

## 2024-02-12 DIAGNOSIS — O99891 Other specified diseases and conditions complicating pregnancy: Secondary | ICD-10-CM | POA: Diagnosis not present

## 2024-02-12 DIAGNOSIS — M25552 Pain in left hip: Secondary | ICD-10-CM

## 2024-02-12 DIAGNOSIS — B9689 Other specified bacterial agents as the cause of diseases classified elsewhere: Secondary | ICD-10-CM

## 2024-02-12 DIAGNOSIS — Z3A10 10 weeks gestation of pregnancy: Secondary | ICD-10-CM | POA: Diagnosis not present

## 2024-02-12 DIAGNOSIS — M549 Dorsalgia, unspecified: Secondary | ICD-10-CM | POA: Diagnosis not present

## 2024-02-12 LAB — POCT URINALYSIS DIPSTICK OB
Blood, UA: NEGATIVE
Glucose, UA: NEGATIVE
Leukocytes, UA: NEGATIVE
Nitrite, UA: NEGATIVE

## 2024-02-12 MED ORDER — SERTRALINE HCL 50 MG PO TABS: ORAL_TABLET | ORAL | 2 refills | Status: AC

## 2024-02-12 MED ORDER — METRONIDAZOLE 0.75 % VA GEL: 1.0000 | Freq: Every day | VAGINAL | 0 refills | Status: AC

## 2024-02-12 NOTE — Progress Notes (Signed)
 GYN VISIT Patient name: Alyssa Sandoval MRN [id number]  Date of birth: [birth year] Chief Complaint:   Back Pain  History of Present Illness:   Alyssa Sandoval is a 23 y.o. [id number]  African-American female@ 10.1wks with DC/DA twins  being seen today for onset of R mid back discomfort- sharp and stabbing- as well as bilat hip pain 'deep inside' the hip bones x 3 days.  This all began when she was straining to have a bowel movement s/p enema use, and has been mostly continuous since then. She has tried heat for her back and K tape to help support her abd and nothing has helped. Denies fever or dysuria; having some N/V. Had a neg abd/pelvic MRI on 10/16 when she was being evaluated in [facility] with severe RLQ abd pain. Reports also having increased depressive symptoms r/t having 2 young children and now a twin pregnancy- denies SI/HI; is requesting a refill on Zoloft  50mg  which she has used before with success. Also open to [facility] referral.  No LMP recorded (lmp unknown). Patient is pregnant. Last pap May 2023. Results were: NILM w/ HRHPV not done     12/14/2022   11:10 AM 08/29/2022    3:16 PM 07/23/2022   10:06 AM 01/24/2022    9:51 AM 12/19/2021    9:46 AM  Depression screen PHQ 2/9  Decreased Interest 0 0 0 3 0  Down, Depressed, Hopeless 0 0 0 0 0  PHQ - 2 Score 0 0 0 3 0  Altered sleeping   3 3   Tired, decreased energy   3 3   Change in appetite   0 0   Feeling bad or failure about yourself    0 0   Trouble concentrating   0 0   Moving slowly or fidgety/restless   0 0   Suicidal thoughts   0 0   PHQ-9 Score   6 9         07/23/2022   10:07 AM 01/24/2022    9:52 AM 08/09/2021    3:56 PM 10/19/2020    2:11 PM  GAD 7 : Generalized Anxiety Score  Nervous, Anxious, on Edge 1 0 1 3  Control/stop worrying 0 0 0 3  Worry too much - different things 0 0 1 3  Trouble relaxing 1 0 0 3  Restless 0 0 0 0  Easily annoyed or irritable 0 0 0 3  Afraid - awful might happen 0 0 0 3  Total GAD 7  Score 2 0 2 18     Review of Systems:   Pertinent items are noted in HPI Denies fever/chills, dizziness, headaches, visual disturbances, fatigue, shortness of breath, chest pain, abdominal pain, vomiting, abnormal vaginal discharge/itching/odor/irritation, problems with periods, bowel movements, urination, or intercourse unless otherwise stated above.  Pertinent History Reviewed:  Reviewed past medical,surgical, social, obstetrical and family history.  Reviewed problem list, medications and allergies. Physical Assessment:   Vitals:   02/12/24 1520  BP: 125/83  Pulse: (!) 103  Weight: 176 lb (79.8 kg)  Height: 5' (1.524 m)  Body mass index is 34.37 kg/m.       Physical Examination:   General appearance: alert, well appearing, and in no distress  Mental status: alert, oriented to person, place, and time  Skin: warm & dry   Back: +tenderness @ R mid back/side; minimal on L side  Cardiovascular: normal heart rate noted  Respiratory: normal respiratory effort, no distress  Abdomen: soft, non-tender  Pelvic: examination not indicated  Extremities: no edema    Results for orders placed or performed in visit on 02/12/24 (from the past 24 hours)  POC Urinalysis Dipstick OB   Collection Time: 02/12/24  3:28 PM  Result Value Ref Range   Color, UA     Clarity, UA     Glucose, UA Negative Negative   Bilirubin, UA     Ketones, UA small    Spec Grav, UA     Blood, UA neg    pH, UA     POC,PROTEIN,UA Small (1+) Negative, Trace, Small (1+), Moderate (2+), Large (3+), 4+   Urobilinogen, UA     Nitrite, UA neg    Leukocytes, UA Negative Negative   Appearance     Odor      Assessment & Plan:  1) R mid back pain & bilat hip pain> began s/p straining to have a BM x 3d ago; already trying heat and K tape with minimal relief; recommended try intermitting icing of the area; also referring to PT to eval for strained muscle  2) Depression> requesting Zoloft  50mg  and [facility] referral  3)  Previous BV dx 10/16> didn't get meds; since having difficulty with N/V will rx Metrogel  x 5 nights  Meds:  Meds ordered this encounter  Medications   sertraline  (ZOLOFT ) 50 MG tablet    Sig: Take half a tablet daily x1 week then take 1 full tablet daily.    Dispense:  30 tablet    Refill:  2    Supervising Provider:   OZAN, JENNIFER [[id number]]   metroNIDAZOLE  (METROGEL ) 0.75 % vaginal gel    Sig: Place 1 Applicatorful vaginally at bedtime for 5 days.    Dispense:  50 g    Refill:  0    Supervising Provider:   MARILYNN NEST [[id number]]    Orders Placed This Encounter  Procedures   Ambulatory referral to [facility]   POC Urinalysis Dipstick OB    Return for As scheduled. (New OB 02/26/24)  Suzen JONETTA Gentry CNM 02/12/2024 3:46 PM

## 2024-02-17 NOTE — Therapy (Incomplete)
 OUTPATIENT PHYSICAL THERAPY THORACOLUMBAR EVALUATION   Patient Name: Alyssa Sandoval MRN: [id number] DOB:08/17/2000, 23 y.o., female Today's Date: 02/17/2024  END OF SESSION:   Past Medical History:  Diagnosis Date   Anemia    Angio-edema    Blood transfusion without reported diagnosis    3 units packed cells and 1 bag of iron   Chronic constipation 06/10/2012   COVID-19 04/2020   Depression    Diabetes mellitus without complication (HCC)    Dysfunctional uterine bleeding    Gallstones    GERD (gastroesophageal reflux disease) 06/10/2012   Gestational diabetes    Headache 12/09/2020   Headache 12/09/2020   High-risk pregnancy 07/13/2021   HIV (human immunodeficiency virus infection) (HCC)    HIV (human immunodeficiency virus infection) (HCC)    HIV disease (HCC) 11/01/2020   -on Biktarvy , last viral load: 07/13/21: 12,000  12/19/21: not detected   EFW @ 28, 36wks     No testing        C/S @ 38wks (VL >1K) or IOL @ 39wks (VL <1K)   HSV (herpes simplex virus) anogenital infection    Low back pain 12/09/2020   Low back pain 12/09/2020   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   Passive suicidal ideations 11/07/2020   Passive suicidal ideations 11/07/2020   Polyhydramnios 02/13/2022   Postpartum hemorrhage 02/16/2022   Pregnancy induced hypertension    Pyelonephritis 08/19/2021   Admission 5/13-5/15   Urine cx poc 09/11/21 neg   Urticaria    Vaginal delivery 02/16/2022   Vaginal pruritus 03/15/2021   Vaginal yeast infection    Past Surgical History:  Procedure Laterality Date   NO PAST SURGERIES     Patient Active Problem List   Diagnosis Date Noted   Cholelithiases 01/09/2023   History of postpartum hemorrhage 10/31/2022   History of gestational diabetes 10/19/2022   Supervision of high risk pregnancy, antepartum 07/17/2022   HIV disease (HCC) 01/29/2022   HSV (herpes simplex virus) anogenital infection 08/09/2021    PCP: None  REFERRING PROVIDER: Loreli Suzen BIRCH,  CNM  REFERRING DIAG:  Diagnosis  O99.891,M54.9 (ICD-10-CM) - Back pain affecting pregnancy in first trimester    Rationale for Evaluation and Treatment: Rehabilitation  THERAPY DIAG:  No diagnosis found.  ONSET DATE: ***  SUBJECTIVE:                                                                                                                                                                                           SUBJECTIVE STATEMENT: ***  PERTINENT HISTORY:  Patient is pregnant ; depression; DM;   PAIN:  Are you having pain? Yes: NPRS scale: ***  Pain location: *** Pain description: *** Aggravating factors: *** Relieving factors: ***  PRECAUTIONS: {Therapy precautions:24002}  RED FLAGS: {PT Red Flags:29287}   WEIGHT BEARING RESTRICTIONS: {Yes ***/No:24003}  FALLS:  Has patient fallen in last 6 months? {fallsyesno:27318}  LIVING ENVIRONMENT: Lives with: {OPRC lives with:25569::lives with their family} Lives in: {Lives in:25570} Stairs: {opstairs:27293} Has following equipment at home: {Assistive devices:23999}  OCCUPATION: ***  PLOF: {PLOF:24004}  PATIENT GOALS: ***  NEXT MD VISIT: ***  OBJECTIVE:  Note: Objective measures were completed at Evaluation unless otherwise noted.  DIAGNOSTIC FINDINGS:  ***  PATIENT SURVEYS:  {rehab surveys:24030}  COGNITION: Overall cognitive status: {cognition:24006}     SENSATION: {sensation:27233}  MUSCLE LENGTH: Hamstrings: Right *** deg; Left *** deg Debby test: Right *** deg; Left *** deg  POSTURE: {posture:25561}  PALPATION: ***  LUMBAR ROM:   AROM eval  Flexion   Extension   Right lateral flexion   Left lateral flexion   Right rotation   Left rotation    (Blank rows = not tested)  LOWER EXTREMITY ROM:     {AROM/PROM:27142}  Right eval Left eval  Hip flexion    Hip extension    Hip abduction    Hip adduction    Hip internal rotation    Hip external rotation    Knee flexion     Knee extension    Ankle dorsiflexion    Ankle plantarflexion    Ankle inversion    Ankle eversion     (Blank rows = not tested)  LOWER EXTREMITY MMT:    MMT Right eval Left eval  Hip flexion    Hip extension    Hip abduction    Hip adduction    Hip internal rotation    Hip external rotation    Knee flexion    Knee extension    Ankle dorsiflexion    Ankle plantarflexion    Ankle inversion    Ankle eversion     (Blank rows = not tested)  LUMBAR SPECIAL TESTS:  {lumbar special test:25242}  FUNCTIONAL TESTS:  {Functional tests:24029}  GAIT: Distance walked: *** Assistive device utilized: {Assistive devices:23999} Level of assistance: {Levels of assistance:24026} Comments: ***  TREATMENT DATE: ***                                                                                                                                 PATIENT EDUCATION:  Education details: *** Person educated: {Person educated:25204} Education method: {Education Method:25205} Education comprehension: {Education Comprehension:25206}  HOME EXERCISE PROGRAM: ***  ASSESSMENT:  CLINICAL IMPRESSION: Patient is a *** y.o. *** who was seen today for physical therapy evaluation and treatment for ***.   OBJECTIVE IMPAIRMENTS: {opptimpairments:25111}.   ACTIVITY LIMITATIONS: {activitylimitations:27494}  PARTICIPATION LIMITATIONS: {participationrestrictions:25113}  PERSONAL FACTORS: {Personal factors:25162} are also affecting patient's functional outcome.   REHAB POTENTIAL: {rehabpotential:25112}  CLINICAL DECISION MAKING: {clinical decision making:25114}  EVALUATION COMPLEXITY: {Evaluation complexity:25115}   GOALS: Goals reviewed with  patient? {yes/no:20286}  SHORT TERM GOALS: Target date: ***  *** Baseline: Goal status: INITIAL  2.  *** Baseline:  Goal status: INITIAL  3.  *** Baseline:  Goal status: INITIAL  4.  *** Baseline:  Goal status: INITIAL  5.   *** Baseline:  Goal status: INITIAL  6.  *** Baseline:  Goal status: INITIAL  LONG TERM GOALS: Target date: ***  *** Baseline:  Goal status: INITIAL  2.  *** Baseline:  Goal status: INITIAL  3.  *** Baseline:  Goal status: INITIAL  4.  *** Baseline:  Goal status: INITIAL  5.  *** Baseline:  Goal status: INITIAL  6.  *** Baseline:  Goal status: INITIAL  PLAN:  PT FREQUENCY: {rehab frequency:25116}  PT DURATION: {rehab duration:25117}  PLANNED INTERVENTIONS: {rehab planned interventions:25118::97110-Therapeutic exercises,97530- Therapeutic [phone]- Neuromuscular re-education,97535- Self Rjmz,02859- Manual therapy,Patient/Family education}.  PLAN FOR NEXT SESSION: PIERRETTE Kristeen Sar, PT 02/17/2024, 10:26 AM

## 2024-02-18 ENCOUNTER — Ambulatory Visit: Payer: MEDICAID | Admitting: Physical Therapy

## 2024-02-20 ENCOUNTER — Ambulatory Visit: Payer: MEDICAID

## 2024-02-24 DIAGNOSIS — O30049 Twin pregnancy, dichorionic/diamniotic, unspecified trimester: Secondary | ICD-10-CM | POA: Insufficient documentation

## 2024-02-24 DIAGNOSIS — O0990 Supervision of high risk pregnancy, unspecified, unspecified trimester: Secondary | ICD-10-CM | POA: Insufficient documentation

## 2024-02-26 ENCOUNTER — Encounter: Payer: MEDICAID | Admitting: *Deleted

## 2024-02-26 ENCOUNTER — Other Ambulatory Visit (HOSPITAL_COMMUNITY)
Admission: RE | Admit: 2024-02-26 | Discharge: 2024-02-26 | Disposition: A | Discharge status: Home / Self Care | Payer: MEDICAID | Source: Ambulatory Visit | Attending: Advanced Practice Midwife | Admitting: Advanced Practice Midwife

## 2024-02-26 ENCOUNTER — Encounter: Payer: Self-pay | Admitting: Advanced Practice Midwife

## 2024-02-26 ENCOUNTER — Ambulatory Visit: Payer: MEDICAID | Admitting: Advanced Practice Midwife

## 2024-02-26 VITALS — BP 116/79 | HR 103 | Wt 177.0 lb

## 2024-02-26 DIAGNOSIS — Z1332 Encounter for screening for maternal depression: Secondary | ICD-10-CM

## 2024-02-26 DIAGNOSIS — Z8632 Personal history of gestational diabetes: Secondary | ICD-10-CM

## 2024-02-26 DIAGNOSIS — B20 Human immunodeficiency virus [HIV] disease: Secondary | ICD-10-CM

## 2024-02-26 DIAGNOSIS — O98711 Human immunodeficiency virus [HIV] disease complicating pregnancy, first trimester: Secondary | ICD-10-CM | POA: Diagnosis not present

## 2024-02-26 DIAGNOSIS — O0991 Supervision of high risk pregnancy, unspecified, first trimester: Secondary | ICD-10-CM | POA: Insufficient documentation

## 2024-02-26 DIAGNOSIS — Z3A12 12 weeks gestation of pregnancy: Secondary | ICD-10-CM | POA: Insufficient documentation

## 2024-02-26 DIAGNOSIS — O30041 Twin pregnancy, dichorionic/diamniotic, first trimester: Secondary | ICD-10-CM

## 2024-02-26 DIAGNOSIS — Z131 Encounter for screening for diabetes mellitus: Secondary | ICD-10-CM

## 2024-02-26 DIAGNOSIS — Z8679 Personal history of other diseases of the circulatory system: Secondary | ICD-10-CM

## 2024-02-26 DIAGNOSIS — Z8759 Personal history of other complications of pregnancy, childbirth and the puerperium: Secondary | ICD-10-CM

## 2024-02-26 DIAGNOSIS — Z79899 Other long term (current) drug therapy: Secondary | ICD-10-CM

## 2024-02-26 DIAGNOSIS — A609 Anogenital herpesviral infection, unspecified: Secondary | ICD-10-CM

## 2024-02-26 DIAGNOSIS — Z363 Encounter for antenatal screening for malformations: Secondary | ICD-10-CM

## 2024-02-26 MED ORDER — ASPIRIN 81 MG PO CHEW: 162.0000 mg | CHEWABLE_TABLET | Freq: Every day | ORAL | 7 refills | Status: AC

## 2024-02-26 MED ORDER — BLOOD PRESSURE CUFF MISC: 0 refills | Status: AC

## 2024-02-26 NOTE — Patient Instructions (Signed)
 Alyssa Sandoval, thank you for choosing our office today! We appreciate the opportunity to meet your healthcare needs. You may receive a short survey by mail, e-mail, or through [employer]. If you are happy with your care we would appreciate if you could take just a few minutes to complete the survey questions. We read all of your comments and take your feedback very seriously. Thank you again for choosing our office.  Center for [employer] [facility] at [facility]  [facility] at [facility] ([address] [place], [place] [address]) Entrance C, located off of [address] Free 24/7 valet parking   Nausea & Vomiting Have saltine crackers or pretzels by your bed and eat a few bites before you raise your head out of bed in the morning Eat small frequent meals throughout the day instead of large meals Drink plenty of fluids throughout the day to stay hydrated, just don't drink a lot of fluids with your meals.  This can make your stomach fill up faster making you feel sick Do not brush your teeth right after you eat Products with real ginger are good for nausea, like ginger ale and ginger hard candy Make sure it says made with real ginger! Sucking on sour candy like lemon heads is also good for nausea If your prenatal vitamins make you nauseated, take them at night so you will sleep through the nausea Sea Bands If you feel like you need medicine for the nausea & vomiting please let us  know If you are unable to keep any fluids or food down please let us  know   Constipation Drink plenty of fluid, preferably water, throughout the day Eat foods high in fiber such as fruits, vegetables, and grains Exercise, such as walking, is a good way to keep your bowels regular Drink warm fluids, especially warm prune juice, or decaf coffee Eat a 1/2 cup of real oatmeal (not instant), 1/2 cup applesauce, and 1/2-1 cup warm prune juice every day If needed, you may take Colace (docusate sodium ) stool  softener once or twice a day to help keep the stool soft.  If you still are having problems with constipation, you may take Miralax  once daily as needed to help keep your bowels regular.   Home Blood Pressure Monitoring for Patients   Your provider has recommended that you check your blood pressure (BP) at least once a week at home. If you do not have a blood pressure cuff at home, one will be provided for you. Contact your provider if you have not received your monitor within 1 week.   Helpful Tips for Accurate Home Blood Pressure Checks  Don't smoke, exercise, or drink caffeine  30 minutes before checking your BP Use the restroom before checking your BP (a full bladder can raise your pressure) Relax in a comfortable upright chair Feet on the ground Left arm resting comfortably on a flat surface at the level of your heart Legs uncrossed Back supported Sit quietly and don't talk Place the cuff on your bare arm Adjust snuggly, so that only two fingertips can fit between your skin and the top of the cuff Check 2 readings separated by at least one minute Keep a log of your BP readings For a visual, please reference this diagram: http://ccnc.care/bpdiagram  Provider Name: [facility]     Phone: [phone]  Zone 1: ALL CLEAR  Continue to monitor your symptoms:  BP reading is less than 140 (top number) or less than 90 (bottom  number)  No right upper stomach pain No headaches or seeing spots No feeling nauseated or throwing up No swelling in face and hands  Zone 2: CAUTION Call your doctor's office for any of the following:  BP reading is greater than 140 (top number) or greater than 90 (bottom number)  Stomach pain under your ribs in the middle or right side Headaches or seeing spots Feeling nauseated or throwing up Swelling in face and hands  Zone 3: EMERGENCY  Seek immediate medical care if you have any of the following:  BP reading is greater than160 (top number) or  greater than 110 (bottom number) Severe headaches not improving with Tylenol  Serious difficulty catching your breath Any worsening symptoms from Zone 2    First Trimester of Pregnancy The first trimester of pregnancy is from week 1 until the end of week 12 (months 1 through 3). A week after a sperm fertilizes an egg, the egg will implant on the wall of the uterus. This embryo will begin to develop into a baby. Genes from you and your partner are forming the baby. The female genes determine whether the baby is a boy or a girl. At 6-8 weeks, the eyes and face are formed, and the heartbeat can be seen on ultrasound. At the end of 12 weeks, all the baby's organs are formed.  Now that you are pregnant, you will want to do everything you can to have a healthy baby. Two of the most important things are to get good prenatal care and to follow your health care provider's instructions. Prenatal care is all the medical care you receive before the baby's birth. This care will help prevent, find, and treat any problems during the pregnancy and childbirth. BODY CHANGES Your body goes through many changes during pregnancy. The changes vary from woman to woman.  You may gain or lose a couple of pounds at first. You may feel sick to your stomach (nauseous) and throw up (vomit). If the vomiting is uncontrollable, call your health care provider. You may tire easily. You may develop headaches that can be relieved by medicines approved by your health care provider. You may urinate more often. Painful urination may mean you have a bladder infection. You may develop heartburn as a result of your pregnancy. You may develop constipation because certain hormones are causing the muscles that push waste through your intestines to slow down. You may develop hemorrhoids or swollen, bulging veins (varicose veins). Your breasts may begin to grow larger and become tender. Your nipples may stick out more, and the tissue that  surrounds them (areola) may become darker. Your gums may bleed and may be sensitive to brushing and flossing. Dark spots or blotches (chloasma, mask of pregnancy) may develop on your face. This will likely fade after the baby is born. Your menstrual periods will stop. You may have a loss of appetite. You may develop cravings for certain kinds of food. You may have changes in your emotions from day to day, such as being excited to be pregnant or being concerned that something may go wrong with the pregnancy and baby. You may have more vivid and strange dreams. You may have changes in your hair. These can include thickening of your hair, rapid growth, and changes in texture. Some women also have hair loss during or after pregnancy, or hair that feels dry or thin. Your hair will most likely return to normal after your baby is born. WHAT TO EXPECT AT YOUR PRENATAL  VISITS During a routine prenatal visit: You will be weighed to make sure you and the baby are growing normally. Your blood pressure will be taken. Your abdomen will be measured to track your baby's growth. The fetal heartbeat will be listened to starting around week 10 or 12 of your pregnancy. Test results from any previous visits will be discussed. Your health care provider may ask you: How you are feeling. If you are feeling the baby move. If you have had any abnormal symptoms, such as leaking fluid, bleeding, severe headaches, or abdominal cramping. If you have any questions. Other tests that may be performed during your first trimester include: Blood tests to find your blood type and to check for the presence of any previous infections. They will also be used to check for low iron levels (anemia) and Rh antibodies. Later in the pregnancy, blood tests for diabetes will be done along with other tests if problems develop. Urine tests to check for infections, diabetes, or protein in the urine. An ultrasound to confirm the proper growth  and development of the baby. An amniocentesis to check for possible genetic problems. Fetal screens for spina bifida and Down syndrome. You may need other tests to make sure you and the baby are doing well. HOME CARE INSTRUCTIONS  Medicines Follow your health care provider's instructions regarding medicine use. Specific medicines may be either safe or unsafe to take during pregnancy. Take your prenatal vitamins as directed. If you develop constipation, try taking a stool softener if your health care provider approves. Diet Eat regular, well-balanced meals. Choose a variety of foods, such as meat or vegetable-based protein, fish, milk and low-fat dairy products, vegetables, fruits, and whole grain breads and cereals. Your health care provider will help you determine the amount of weight gain that is right for you. Avoid raw meat and uncooked cheese. These carry germs that can cause birth defects in the baby. Eating four or five small meals rather than three large meals a day may help relieve nausea and vomiting. If you start to feel nauseous, eating a few soda crackers can be helpful. Drinking liquids between meals instead of during meals also seems to help nausea and vomiting. If you develop constipation, eat more high-fiber foods, such as fresh vegetables or fruit and whole grains. Drink enough fluids to keep your urine clear or pale yellow. Activity and Exercise Exercise only as directed by your health care provider. Exercising will help you: Control your weight. Stay in shape. Be prepared for labor and delivery. Experiencing pain or cramping in the lower abdomen or low back is a good sign that you should stop exercising. Check with your health care provider before continuing normal exercises. Try to avoid standing for long periods of time. Move your legs often if you must stand in one place for a long time. Avoid heavy lifting. Wear low-heeled shoes, and practice good posture. You may  continue to have sex unless your health care provider directs you otherwise. Relief of Pain or Discomfort Wear a good support bra for breast tenderness.   Take warm sitz baths to soothe any pain or discomfort caused by hemorrhoids. Use hemorrhoid cream if your health care provider approves.   Rest with your legs elevated if you have leg cramps or low back pain. If you develop varicose veins in your legs, wear support hose. Elevate your feet for 15 minutes, 3-4 times a day. Limit salt in your diet. Prenatal Care Schedule your prenatal visits by the  twelfth week of pregnancy. They are usually scheduled monthly at first, then more often in the last 2 months before delivery. Write down your questions. Take them to your prenatal visits. Keep all your prenatal visits as directed by your health care provider. Safety Wear your seat belt at all times when driving. Make a list of emergency phone numbers, including numbers for family, friends, the hospital, and police and fire departments. General Tips Ask your health care provider for a referral to a local prenatal education class. Begin classes no later than at the beginning of month 6 of your pregnancy. Ask for help if you have counseling or nutritional needs during pregnancy. Your health care provider can offer advice or refer you to specialists for help with various needs. Do not use hot tubs, steam rooms, or saunas. Do not douche or use tampons or scented sanitary pads. Do not cross your legs for long periods of time. Avoid cat litter boxes and soil used by cats. These carry germs that can cause birth defects in the baby and possibly loss of the fetus by miscarriage or stillbirth. Avoid all smoking, herbs, alcohol, and medicines not prescribed by your health care provider. Chemicals in these affect the formation and growth of the baby. Schedule a dentist appointment. At home, brush your teeth with a soft toothbrush and be gentle when you floss. SEEK  MEDICAL CARE IF:  You have dizziness. You have mild pelvic cramps, pelvic pressure, or nagging pain in the abdominal area. You have persistent nausea, vomiting, or diarrhea. You have a bad smelling vaginal discharge. You have pain with urination. You notice increased swelling in your face, hands, legs, or ankles. SEEK IMMEDIATE MEDICAL CARE IF:  You have a fever. You are leaking fluid from your vagina. You have spotting or bleeding from your vagina. You have severe abdominal cramping or pain. You have rapid weight gain or loss. You vomit blood or material that looks like coffee grounds. You are exposed to German measles and have never had them. You are exposed to fifth disease or chickenpox. You develop a severe headache. You have shortness of breath. You have any kind of trauma, such as from a fall or a car accident. Document Released: 03/20/2001 Document Revised: 08/10/2013 Document Reviewed: 02/03/2013 [facility] Patient Information 2015 [place], [place]. This information is not intended to replace advice given to you by your health care provider. Make sure you discuss any questions you have with your health care provider.

## 2024-02-26 NOTE — Therapy (Unsigned)
 OUTPATIENT PHYSICAL THERAPY THORACOLUMBAR EVALUATION   Patient Name: Alyssa Sandoval MRN: [id number] DOB:2000-07-25, 23 y.o., female Today's Date: 02/27/2024  END OF SESSION:  PT End of Session - 02/27/24 1221     Visit Number 1    Date for Recertification  04/23/24    Authorization Type Loralyn barrows requested    PT Start Time 1152    PT Stop Time 1222    PT Time Calculation (min) 30 min    Activity Tolerance Patient tolerated treatment well    Behavior During Therapy [facility] for tasks assessed/performed          Past Medical History:  Diagnosis Date   Anemia    Angio-edema    Blood transfusion without reported diagnosis    3 units packed cells and 1 bag of iron   Chronic constipation 06/10/2012   COVID-19 04/2020   Depression    Diabetes mellitus without complication (HCC)    Dysfunctional uterine bleeding    Gallstones    GERD (gastroesophageal reflux disease) 06/10/2012   Gestational diabetes    Headache 12/09/2020   Headache 12/09/2020   High-risk pregnancy 07/13/2021   HIV (human immunodeficiency virus infection) (HCC)    HIV (human immunodeficiency virus infection) (HCC)    HIV disease (HCC) 11/01/2020   -on Biktarvy , last viral load: 07/13/21: 12,000  12/19/21: not detected   EFW @ 28, 36wks     No testing        C/S @ 38wks (VL >1K) or IOL @ 39wks (VL <1K)   HSV (herpes simplex virus) anogenital infection    Low back pain 12/09/2020   Low back pain 12/09/2020   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   Passive suicidal ideations 11/07/2020   Passive suicidal ideations 11/07/2020   Polyhydramnios 02/13/2022   Postpartum hemorrhage 02/16/2022   Pregnancy induced hypertension    Pyelonephritis 08/19/2021   Admission 5/13-5/15   Urine cx poc 09/11/21 neg   Urticaria    Vaginal delivery 02/16/2022   Vaginal pruritus 03/15/2021   Vaginal yeast infection    Past Surgical History:  Procedure Laterality Date   NO PAST SURGERIES     Patient Active Problem List    Diagnosis Date Noted   History of postpartum hypertension 02/26/2024   Supervision of high-risk pregnancy 02/24/2024   Dichorionic diamniotic twin gestation 02/24/2024   Cholelithiases 01/09/2023   History of postpartum hemorrhage 10/31/2022   History of gestational diabetes 10/19/2022   HIV disease (HCC) 01/29/2022   HSV (herpes simplex virus) anogenital infection 08/09/2021    PCP: None  REFERRING PROVIDER: Loreli Suzen BIRCH, CNM  REFERRING DIAG:  Diagnosis  O99.891,M54.9 (ICD-10-CM) - Back pain affecting pregnancy in first trimester    Rationale for Evaluation and Treatment: Rehabilitation  THERAPY DIAG:  Muscle weakness (generalized) - Plan: PT plan of care cert/re-cert  Muscle spasm of back - Plan: PT plan of care cert/re-cert  Pain in thoracic spine - Plan: PT plan of care cert/re-cert  Other low back pain - Plan: PT plan of care cert/re-cert  ONSET DATE: 01/27/24  SUBJECTIVE:  SUBJECTIVE STATEMENT: Pt is [redacted] weeks pregnant with twins and began having pain in the thoracic spine ~1 month ago. She has been constipated due to taking Zofran  for nausea and believes that she strained the muscles in her upper back.  She has 2 young children at home that she cares for.   PERTINENT HISTORY:  Patient is pregnant ; depression; DM; Pt has 2 children at home ages 2 and 1)  PAIN: 02/27/24 Are you having pain? Yes: NPRS scale: 7/10 Pain location: thoracic spine on Rt Pain description: aching, stabbing Aggravating factors: constant, always there Relieving factors: Tylenol , warm bath   PRECAUTIONS: Other: pt is pregnant with twins   RED FLAGS: None   WEIGHT BEARING RESTRICTIONS: No  FALLS:  Has patient fallen in last 6 months? No  LIVING ENVIRONMENT: Lives with: lives with their  family   OCCUPATION: does not work, had 2 toddlers at home   PLOF: Independent, Vocation/Vocational requirements: does not work, and Leisure: none   PATIENT GOALS: reduce upper back pain, pick up children without pain or limitation    OBJECTIVE:  Note: Objective measures were completed at Evaluation unless otherwise noted.  DIAGNOSTIC FINDINGS:  None   PATIENT SURVEYS:  02/26/24: Modified Oswestry:  23/50=46% disability    COGNITION: Overall cognitive status: Within functional limits for tasks assessed     SENSATION: WFL  POSTURE: rounded shoulders and forward head  PALPATION: Palpable tenderness over Rt distal lats and posterior Rt glenohumeral joint  LUMBAR ROM:   Full with thoracic pain with Lt side bending, reduced segmental mobility in thoracic spine with active movement   Upper EXTREMITY ROM:    Full UE A/ROM with pain at end range on Rt UE  LOWER EXTREMITY MMT:   4 to 4+/5 bil LE strength    GAIT: Distance walked: 50 feet   Comments: flexed trunk, forward head and rounded shoulder posture   TREATMENT DATE:  02/27/24 Findings from evaluation discussed, pt educated on plan of care, HEP initiated.   If treatment provided at initial evaluation, no treatment charged due to lack of authorization.                                                                                                                                    PATIENT EDUCATION:  Education details: HEP, posture  Person educated: Patient Education method: Explanation, Demonstration, and Handouts Education comprehension: verbalized understanding and returned demonstration  HOME EXERCISE PROGRAM: Access Code: [email or web address] URL: [email or web address] Date: 02/27/2024 Prepared by: Burnard  Exercises - Sidelying Open Book Thoracic Rotation with Knee on Foam Roll  - 2 x daily - 7 x weekly - 1 sets - 10 reps - Cat-Camel  - 2 x daily - 7 x weekly - 1 sets - 10 reps - 5 hold - Child's  Pose Stretch  - 2 x daily - 7 x weekly - 1 sets - 3 reps - 20 hold -  Child's Pose with Sidebending  - 2 x daily - 7 x weekly - 1 sets - 3 reps - 20 hold - Seated Scapular Retraction  - 3 x daily - 7 x weekly - 1 sets - 10 reps - 5 hold - Standing 'L' Stretch at Counter  - 3 x daily - 7 x weekly - 1 sets - 3 reps - 10 hold - Standing Thoracic Open Book at Wall  - 3 x daily - 7 x weekly - 1 sets - 10 reps - 5 hold  ASSESSMENT:  CLINICAL IMPRESSION: Patient is a 23 y.o. female who was seen today for physical therapy evaluation and treatment for thoracic pain that began ~1 month ago. She was constipated due to medication and thinks that she strained her thoracic spine.  Pt is pregnant with twins and has a 41 and 24 year old at home.  She reports constant 7/10 thoracic pain with all activities and is limited with lifting and carrying her children.  Pt with poor seated posture, Modified Oswestry is 46% disability and pt with localized palpable tenderness over Rt distal lats.  Painful Rt UE A/ROM and reduced strength in Bil Ues.  Patient will benefit from skilled PT to address the below impairments and improve overall function.   OBJECTIVE IMPAIRMENTS: decreased activity tolerance, decreased strength, increased muscle spasms, impaired UE functional use, improper body mechanics, postural dysfunction, and pain.   ACTIVITY LIMITATIONS: carrying, lifting, reach over head, and caring for others  PARTICIPATION LIMITATIONS: meal prep, cleaning, laundry, driving, and shopping  PERSONAL FACTORS: 1 comorbidity: multiple pregnancies in the past 4 years  are also affecting patient's functional outcome.   REHAB POTENTIAL: Good  CLINICAL DECISION MAKING: Stable/uncomplicated  EVALUATION COMPLEXITY: Low   GOALS: Goals reviewed with patient? Yes  SHORT TERM GOALS: Target date: 03/26/2024    Be independent in initial HEP Baseline: Goal status: INITIAL  2.  Report postural corrections at home to reduced  thoracic strain with daily tasks  Baseline: forward head and rounded shoulders  Goal status: INITIAL  3.  Report > or = to 30% reduction in thoracic pain with lifting her children  Baseline: 7/10 Goal status: INITIAL    LONG TERM GOALS: Target date: 04/23/2024    Be independent in advanced HEP Baseline:  Goal status: INITIAL  2.  Improve Modified Oswestry to < or = to 13/50=26% disability  Baseline: 23/50 Goal status: INITIAL  3.  Report > or = to 70% reduction in thoracic pain with lifting her children  Baseline: 7/10 Goal status: INITIAL  4.  Lift and carry her children without limitation due to thoracic and Rt UE pain  Baseline:  Goal status: INITIAL   PLAN:  PT FREQUENCY: 1x/week  PT DURATION: 8 weeks  PLANNED INTERVENTIONS: 97110-Therapeutic exercises, 97530- Therapeutic activity, [id number]- Neuromuscular re-education, 97535- Self Care, [id number]- Manual therapy, [phone]- Canalith repositioning, [id number]- Aquatic Therapy, [phone]- Ionotophoresis 4mg /ml Dexamethasone, 79439 (1-2 muscles), [id number] (3+ muscles)- Dry Needling, Patient/Family education, Taping, Cryotherapy, and Moist heat.  PLAN FOR NEXT SESSION: postural strength, work on lifting/mechanics, manual to Rt lats/shoulder    [employer], PT 02/27/24 1:19 PM

## 2024-02-26 NOTE — Progress Notes (Signed)
 INITIAL OBSTETRICAL VISIT Patient name: Alyssa Sandoval MRN [id number]  Date of birth: 09/13/2000 Chief Complaint:   Initial Prenatal Visit (Right lower quad pain)  History of Present Illness:   Alyssa Sandoval is a 23 y.o. [phone] African-American female at [redacted]w[redacted]d by US  at 6.6 weeks with an Estimated Date of Delivery: 09/08/24 being seen today for her initial obstetrical visit.   No LMP recorded (lmp unknown). Patient is pregnant. Her obstetrical history is significant for SAB 2020; term vag deliveries x 2 in 2023 & 2024 w PPH x 2, ppHTN w 1st, GDM w 2nd.   Today she reports R low abd sharp pain; hip pain from 3wks ago is improving some.  Last pap May 2023. Results were: NILM w/ HRHPV not done     02/26/2024    3:32 PM 12/14/2022   11:10 AM 08/29/2022    3:16 PM 07/23/2022   10:06 AM 01/24/2022    9:51 AM  Depression screen PHQ 2/9  Decreased Interest 2 0 0 0 3  Down, Depressed, Hopeless 2 0 0 0 0  PHQ - 2 Score 4 0 0 0 3  Altered sleeping 3   3 3   Tired, decreased energy 3   3 3   Change in appetite 3   0 0  Feeling bad or failure about yourself  2   0 0  Trouble concentrating 0   0 0  Moving slowly or fidgety/restless 0   0 0  Suicidal thoughts 0   0 0  PHQ-9 Score 15   6  9       Data saved with a previous flowsheet row definition        02/26/2024    3:32 PM 07/23/2022   10:07 AM 01/24/2022    9:52 AM 08/09/2021    3:56 PM  GAD 7 : Generalized Anxiety Score  Nervous, Anxious, on Edge 1 1 0 1  Control/stop worrying 1 0 0 0  Worry too much - different things 3 0 0 1  Trouble relaxing 3 1 0 0  Restless 0 0 0 0  Easily annoyed or irritable 0 0 0 0  Afraid - awful might happen 0 0 0 0  Total GAD 7 Score 8 2 0 2     Review of Systems:   Pertinent items are noted in HPI Denies cramping/contractions, leakage of fluid, vaginal bleeding, abnormal vaginal discharge w/ itching/odor/irritation, headaches, visual changes, shortness of breath, chest pain, abdominal pain,  severe nausea/vomiting, or problems with urination or bowel movements unless otherwise stated above.  Pertinent History Reviewed:  Reviewed past medical,surgical, social, obstetrical and family history.  Reviewed problem list, medications and allergies. OB History  Gravida Para Term Preterm AB Living  4 2 2  0 1 2  SAB IAB Ectopic Multiple Live Births  1 0 0 0 2    # Outcome Date GA Lbr Len/2nd Weight Sex Type Anes PTL Lv  4 Current           3 Term 01/10/23 [redacted]w[redacted]d / 00:09 6 lb 14.1 oz (3.12 kg) F Vag-Spont EPI  LIV     Complications: Gestational diabetes, Postpartum hemorrhage  2 Term 02/14/22 [redacted]w[redacted]d 04:49 / 03:39 7 lb 1.6 oz (3.22 kg) M Vag-Spont EPI  LIV     Complications: Postpartum hypertension, Postpartum hemorrhage  1 SAB 2020           Physical Assessment:   Vitals:   02/26/24 1546  BP: 116/79  Pulse: (!) 103  Weight: 177 lb (80.3 kg)  Body mass index is 34.57 kg/m.       Physical Examination:  General appearance - well appearing, and in no distress  Mental status - alert, oriented to person, place, and time  Psych:  She has a normal mood and affect  Skin - warm and dry, normal color, no suspicious lesions noted  Chest - effort normal, all lung fields clear to auscultation bilaterally  Heart - normal rate and regular rhythm  Abdomen - soft, nontender; brief bedside u/s w +FHTs and movement x 2  Extremities:  No swelling or varicosities noted  Pelvic - not examined  Thin prep pap is not done    No results found for this or any previous visit (from the past 24 hours).  Assessment & Plan:  1) High-Risk Pregnancy [id number] at [redacted]w[redacted]d with an Estimated Date of Delivery: 09/08/24   2) Initial OB visit  3) DC/DA twins, rx bASA  4) HIV+, taking Biktarvy ; RNA quant and T helper labs added to NOB bloodwork; check regarding seeing ID @ next visit  5) Hx A2GDM (Met) with 2nd baby, HgbA1c today  6) Hx PPH with both deliveries  7) Hx ppHTN with 1st baby  Meds:  Meds ordered  this encounter  Medications   Blood Pressure Monitoring (BLOOD PRESSURE CUFF) MISC    Sig: Use as directed to check blood pressure daily    Dispense:  1 each    Refill:  0    O09.90 Please mail to patient   aspirin  81 MG chewable tablet    Sig: Chew 2 tablets (162 mg total) by mouth daily.    Dispense:  60 tablet    Refill:  7    Supervising Provider:   MARILYNN NEST [[id number]]    Initial labs obtained Continue prenatal vitamins Reviewed n/v relief measures and warning s/s to report Reviewed recommended weight gain based on pre-gravid BMI Encouraged well-balanced diet Genetic & carrier screening discussed: requests Panorama and AFP, declines Horizon (neg 2023) Ultrasound discussed; fetal survey: requested CCNC completed> form faxed if has or is planning to apply for medicaid The nature of [facility] [employer] with multiple MDs and other Advanced Practice Providers was explained to patient; also emphasized that fellows, residents, and students are part of our team. Does not have home bp cuff. Office bp cuff given: no. Rx sent: yes. Check bp weekly, let us  know if consistently >140/90.   Indications for ASA therapy (per uptodate) One of the following: Multifetal gestation Yes  Follow-up: Return for 4wk HROB/AFP; 8wk HROB/anatomy u/s.   Orders Placed This Encounter  Procedures   Urine Culture   US  OB Comp + 14 Wk   US  OB Comp AddL Gest + 14 Wk   CBC/D/Plt+RPR+Rh+ABO+RubIgG...   Hemoglobin A1c   PANORAMA PRENATAL TEST   Comprehensive metabolic panel with GFR   Protein / creatinine ratio, urine    Suzen JONETTA [facility] 02/26/2024 4:13 PM

## 2024-02-27 ENCOUNTER — Ambulatory Visit: Payer: MEDICAID | Attending: Advanced Practice Midwife

## 2024-02-27 ENCOUNTER — Other Ambulatory Visit: Payer: Self-pay

## 2024-02-27 DIAGNOSIS — M5459 Other low back pain: Secondary | ICD-10-CM | POA: Diagnosis present

## 2024-02-27 DIAGNOSIS — M6281 Muscle weakness (generalized): Secondary | ICD-10-CM | POA: Diagnosis present

## 2024-02-27 DIAGNOSIS — M546 Pain in thoracic spine: Secondary | ICD-10-CM | POA: Diagnosis present

## 2024-02-27 DIAGNOSIS — M62830 Muscle spasm of back: Secondary | ICD-10-CM | POA: Insufficient documentation

## 2024-02-27 DIAGNOSIS — M549 Dorsalgia, unspecified: Secondary | ICD-10-CM | POA: Insufficient documentation

## 2024-02-27 DIAGNOSIS — O99891 Other specified diseases and conditions complicating pregnancy: Secondary | ICD-10-CM | POA: Insufficient documentation

## 2024-02-27 LAB — HIV 1/2 AB DIFFERENTIATION
HIV 1 Ab: REACTIVE
HIV 2 Ab: NONREACTIVE
NOTE (HIV CONF MULTIP: POSITIVE — AB

## 2024-02-27 LAB — COMPREHENSIVE METABOLIC PANEL WITH GFR
ALT: 34 IU/L — ABNORMAL HIGH (ref 0–32)
AST: 21 IU/L (ref 0–40)
Albumin: 4.3 g/dL (ref 4.0–5.0)
Alkaline Phosphatase: 87 IU/L (ref 41–116)
BUN/Creatinine Ratio: 17 (ref 9–23)
BUN: 8 mg/dL (ref 6–20)
Bilirubin Total: <0.2 mg/dL (ref 0.0–1.2)
CO2: 19 mmol/L — ABNORMAL LOW (ref 20–29)
Calcium: 8.9 mg/dL (ref 8.7–10.2)
Chloride: 100 mmol/L (ref 96–106)
Creatinine, Ser: 0.46 mg/dL — ABNORMAL LOW (ref 0.57–1.00)
Globulin, Total: 3 g/dL (ref 1.5–4.5)
Glucose: 77 mg/dL (ref 70–99)
Potassium: 4 mmol/L (ref 3.5–5.2)
Sodium: 135 mmol/L (ref 134–144)
Total Protein: 7.3 g/dL (ref 6.0–8.5)
eGFR: 137 mL/min/1.73 (ref >59)

## 2024-02-27 LAB — CBC/D/PLT+RPR+RH+ABO+RUBIGG...
Antibody Screen: NEGATIVE
Basophils Absolute: 0 x10E3/uL (ref 0.0–0.2)
Basos: 0 %
EOS (ABSOLUTE): 0.1 x10E3/uL (ref 0.0–0.4)
Eos: 1 %
HCV Ab: NONREACTIVE
HIV Screen 4th Generation wRfx: REACTIVE
Hematocrit: 37.5 % (ref 34.0–46.6)
Hemoglobin: 11.8 g/dL (ref 11.1–15.9)
Hepatitis B Surface Ag: NEGATIVE
Immature Grans (Abs): 0 x10E3/uL (ref 0.0–0.1)
Immature Granulocytes: 0 %
Lymphocytes Absolute: 2.4 x10E3/uL (ref 0.7–3.1)
Lymphs: 24 %
MCH: 25.8 pg — ABNORMAL LOW (ref 26.6–33.0)
MCHC: 31.5 g/dL (ref 31.5–35.7)
MCV: 82 fL (ref 79–97)
Monocytes Absolute: 0.6 x10E3/uL (ref 0.1–0.9)
Monocytes: 6 %
Neutrophils Absolute: 7 x10E3/uL (ref 1.4–7.0)
Neutrophils: 69 %
Platelets: 363 x10E3/uL (ref 150–450)
RBC: 4.58 x10E6/uL (ref 3.77–5.28)
RDW: 16.2 % — ABNORMAL HIGH (ref 11.7–15.4)
RPR Ser Ql: NONREACTIVE
Rh Factor: POSITIVE
Rubella Antibodies, IGG: 2.08 {index} (ref >0.99)
WBC: 10.1 x10E3/uL (ref 3.4–10.8)

## 2024-02-27 LAB — PROTEIN / CREATININE RATIO, URINE
Creatinine, Urine: 218.3 mg/dL
Protein, Ur: 23 mg/dL
Protein/Creat Ratio: 105 mg/g{creat} (ref 0–200)

## 2024-02-27 LAB — HEMOGLOBIN A1C
Est. average glucose Bld gHb Est-mCnc: 114 mg/dL
Hgb A1c MFr Bld: 5.6 % (ref 4.8–5.6)

## 2024-02-27 LAB — HCV INTERPRETATION

## 2024-02-28 ENCOUNTER — Encounter: Payer: Self-pay | Admitting: Advanced Practice Midwife

## 2024-02-28 LAB — URINE CULTURE

## 2024-02-28 LAB — CERVICOVAGINAL ANCILLARY ONLY
Chlamydia: NEGATIVE
Comment: NEGATIVE
Comment: NORMAL
Neisseria Gonorrhea: NEGATIVE

## 2024-03-04 LAB — PANORAMA PRENATAL TEST FULL PANEL:PANORAMA TEST PLUS 5 ADDITIONAL MICRODELETIONS
FETAL FRACTION SECOND FETUS: 4.7
FETAL FRACTION: 8

## 2024-03-06 ENCOUNTER — Ambulatory Visit: Payer: Self-pay | Admitting: Advanced Practice Midwife

## 2024-03-09 ENCOUNTER — Ambulatory Visit: Payer: MEDICAID | Admitting: Clinical

## 2024-03-09 ENCOUNTER — Encounter: Payer: Self-pay | Admitting: *Deleted

## 2024-03-09 DIAGNOSIS — Z91199 Patient's noncompliance with other medical treatment and regimen due to unspecified reason: Secondary | ICD-10-CM

## 2024-03-09 NOTE — BH Specialist Note (Signed)
 Pt did not arrive to video visit and did not answer the phone; Unable to leave a message; left MyChart message for patient.

## 2024-03-14 ENCOUNTER — Inpatient Hospital Stay (HOSPITAL_COMMUNITY)
Admission: AD | Admit: 2024-03-14 | Discharge: 2024-03-14 | Disposition: A | Discharge status: Home / Self Care | Payer: MEDICAID | Attending: Obstetrics and Gynecology | Admitting: Obstetrics and Gynecology

## 2024-03-14 ENCOUNTER — Inpatient Hospital Stay (HOSPITAL_COMMUNITY): Payer: MEDICAID

## 2024-03-14 ENCOUNTER — Other Ambulatory Visit: Payer: Self-pay

## 2024-03-14 DIAGNOSIS — O26892 Other specified pregnancy related conditions, second trimester: Secondary | ICD-10-CM

## 2024-03-14 DIAGNOSIS — B9689 Other specified bacterial agents as the cause of diseases classified elsewhere: Secondary | ICD-10-CM

## 2024-03-14 DIAGNOSIS — W19XXXA Unspecified fall, initial encounter: Secondary | ICD-10-CM

## 2024-03-14 DIAGNOSIS — Z3A14 14 weeks gestation of pregnancy: Secondary | ICD-10-CM

## 2024-03-14 DIAGNOSIS — B3731 Acute candidiasis of vulva and vagina: Secondary | ICD-10-CM

## 2024-03-14 DIAGNOSIS — M545 Low back pain, unspecified: Secondary | ICD-10-CM

## 2024-03-14 LAB — WET PREP, GENITAL
Sperm: NONE SEEN
Trich, Wet Prep: NONE SEEN
WBC, Wet Prep HPF POC: <10 (ref <10)

## 2024-03-14 MED ORDER — CYCLOBENZAPRINE HCL 10 MG PO TABS: 10.0000 mg | ORAL_TABLET | Freq: Three times a day (TID) | ORAL | 0 refills | Status: DC | PRN

## 2024-03-14 MED ORDER — NYSTATIN 100000 UNIT/GM EX CREA: 1.0000 | TOPICAL_CREAM | Freq: Two times a day (BID) | CUTANEOUS | 0 refills | Status: DC

## 2024-03-14 MED ORDER — METRONIDAZOLE 500 MG PO TABS: 500.0000 mg | ORAL_TABLET | Freq: Two times a day (BID) | ORAL | 0 refills | Status: AC

## 2024-03-14 MED ORDER — CLOTRIMAZOLE 1 % VA CREA: 1.0000 | TOPICAL_CREAM | Freq: Every day | VAGINAL | 2 refills | Status: AC

## 2024-03-14 NOTE — Discharge Instructions (Signed)
 Alyssa Sandoval,  Please take metronidazole  500mg  twice daily for 7 days. To treat your yeast infection apply clotrimazole  vaginally and nystatin  to the vulvar mucous membranes (externally) both for 14 days, since you developed this yeast infection after taking treatment for BV previously.  Take cyclobenzaprine  and/or Tylenol  for back pain. The cyclobenzaprine  can make you sleepy.   Your ultrasound was normal.  Please return to the MAU if you have vaginal bleeding, intractable nausea/vomiting, severe pelvic/abdo pain, or other severe symptoms.  Follow up at [facility] for your prenatal care.  Take care, Dr. Trudy

## 2024-03-14 NOTE — MAU Note (Cosign Needed)
 Maternal Assessment Unit Provider Note  Subjective: [name] is a 23 y.o. [phone] pregnant female at [redacted]w[redacted]d who presents to MAU today with complaint of vaginal itching and irritation, also notably had a fall 3 days ago.   Patient reports she completed treatment for BV 2 weeks ago which precipitated some vaginal discharge with severe itching and irritation with urination.  No intercourse since treatment for BV. She tried a 7-day course of vaginal Monistat with no improvement.  Regarding follow-up: She was seen watching TV with her baby, she tried to get up even though her leg was feeling numb, and she fell onto her back.  Reports back and right hip soreness.  No vaginal bleeding.  She took Tylenol  yesterday without improvement.  She also reports decreased fetal movement since then.  She is already in physical therapy for her back pain.  [facility] reviewed.  Pertinent items noted in HPI and remainder of comprehensive ROS otherwise negative.   Objective: BP 116/70 (BP Location: Right Arm)   Pulse (!) 110   Temp 98.1 F (36.7 C) (Oral)   Resp 16   Ht 4' 10 (1.473 m)   Wt 80.7 kg   LMP  (LMP Unknown)   SpO2 100%   BMI 37.20 kg/m  Physical Exam Vitals reviewed.  Constitutional:      General: She is not in acute distress.    Appearance: She is well-developed. She is not diaphoretic.  Eyes:     General: No scleral icterus. Pulmonary:     Effort: Pulmonary effort is normal. No respiratory distress.  Skin:    General: Skin is warm and dry.  Neurological:     Mental Status: She is alert.     Coordination: Coordination normal.     Comments: Ambulating independently    MDM: Moderate risk  MAU Course: Upon presentation to the MAU vital signs normal aside from mild tachycardia.  Ultrasound and vaginal swabs were ordered.  Ultrasound was reassuring/normal.  Vaginal swabs demonstrated yeast and BV infections.  Patient was  interviewed.  Results and treatments discussed.  Counseled regarding fetal movement not expected to be consistent until into 20 weeks.  Discussed pain management options for back pain including muscle relaxant which patient accepted.  Questions were answered to the satisfaction of the patient and/or family prior to discharge.   Assessment Medical screening exam complete    ICD-10-CM   1. Fall, initial encounter  W19.XXXA     2. Low back pain, unspecified back pain laterality, unspecified chronicity, unspecified whether sciatica present  M54.50     3. Yeast vaginitis  B37.31     4. Bacterial vaginosis  N76.0    B96.89      Plan -Complete 1 week course of p.o. metronidazole  for BV -Complete 2-week course of vaginal clotrimazole  vaginally and nystatin  externally to current infection cover for aggravation of Candida after treatment with antibiotics -For back pain take acetaminophen  and cyclobenzaprine  -Cont physical therapy  Discharge from MAU in stable condition with strict return/second trimester precautions Follow up at [facility] as scheduled for ongoing prenatal care  Allergies as of 03/14/2024   No Known Allergies      Medication List     STOP taking these medications    Doxylamine -Pyridoxine  10-10 MG Tbec Commonly known as: Diclegis        TAKE these medications    aspirin  81 MG chewable tablet Chew 2 tablets (162 mg total) by mouth daily.   Biktarvy   50-200-25 MG Tabs tablet Generic drug: bictegravir-emtricitabine -tenofovir  AF Take 1 tablet by mouth daily.   Blood Pressure Cuff Misc Use as directed to check blood pressure daily   clotrimazole  1 % vaginal cream Commonly known as: GYNE-LOTRIMIN  Place 1 Applicatorful vaginally at bedtime for 14 days.   cyclobenzaprine  10 MG tablet Commonly known as: FLEXERIL  Take 1 tablet (10 mg total) by mouth 3 (three) times daily as needed for muscle spasms. Can caused drowsiness!   ferrous sulfate  325 (65 FE) MG  tablet Take 1 tablet (325 mg total) by mouth every other day.   metoCLOPramide 10 MG tablet Commonly known as: REGLAN Take 1 tablet (10 mg total) by mouth 3 (three) times daily with meals.   metroNIDAZOLE  500 MG tablet Commonly known as: FLAGYL  Take 1 tablet (500 mg total) by mouth 2 (two) times daily for 7 days.   ondansetron  4 MG disintegrating tablet Commonly known as: ZOFRAN -ODT Take 1 tablet (4 mg total) by mouth every 8 (eight) hours as needed for nausea.   polyethylene glycol powder 17 GM/SCOOP powder Commonly known as: MiraLax  Take 17 g by mouth daily. Dissolve 1 capful (17g) in 4-8 ounces of liquid and take by mouth daily.   PRENATAL GUMMIES PO Take by mouth.   promethazine  25 MG tablet Commonly known as: PHENERGAN  Take 0.5-1 tablets (12.5-25 mg total) by mouth every 6 (six) hours as needed.   senna-docusate 8.6-50 MG tablet Commonly known as: Senokot-S Take 2 tablets by mouth daily.   sertraline  50 MG tablet Commonly known as: ZOLOFT  Take half a tablet daily x1 week then take 1 full tablet daily.        Trudy Leeroy NOVAK, MD 03/14/2024 2:40 PM

## 2024-03-14 NOTE — MAU Note (Signed)
 Alyssa Sandoval is a 23 y.o. at [redacted]w[redacted]d here in MAU reporting: thinks she has a UTI or a yeast infection because it is uncomfortable down there. Does not burn with urination but it is uncomfortable. States she has clumpy white vaginal discharge. Excessive vaginal itching for 2 weeks. Denies any recent sexual intercourse. Tried monostat without relief.  Fell 3-4 days ago. Has not felt any fetal movement since the fall. Denies any VB or LOF. Took tylenol  yesterday for back pain with no relief.   Onset of complaint: ongoing  Pain score: back 7  Vitals:   03/14/24 1155  BP: 116/70  Pulse: (!) 110  Resp: 16  Temp: 98.1 F (36.7 C)  SpO2: 100%     FHT: Twins-US  Lab orders placed from triage:  vaginal swabs

## 2024-03-17 LAB — GC/CHLAMYDIA PROBE AMP (~~LOC~~) NOT AT ARMC
Chlamydia: NEGATIVE
Comment: NEGATIVE
Comment: NORMAL
Neisseria Gonorrhea: NEGATIVE

## 2024-03-18 ENCOUNTER — Ambulatory Visit: Payer: MEDICAID | Admitting: Physical Therapy

## 2024-03-19 ENCOUNTER — Encounter (HOSPITAL_COMMUNITY): Payer: Self-pay | Admitting: Family Medicine

## 2024-03-19 ENCOUNTER — Inpatient Hospital Stay (HOSPITAL_COMMUNITY)
Admission: AD | Admit: 2024-03-19 | Discharge: 2024-03-19 | Disposition: A | Discharge status: Home / Self Care | Payer: MEDICAID | Attending: Family Medicine | Admitting: Family Medicine

## 2024-03-19 DIAGNOSIS — Z3A15 15 weeks gestation of pregnancy: Secondary | ICD-10-CM

## 2024-03-19 DIAGNOSIS — N898 Other specified noninflammatory disorders of vagina: Secondary | ICD-10-CM | POA: Diagnosis not present

## 2024-03-19 DIAGNOSIS — O26892 Other specified pregnancy related conditions, second trimester: Secondary | ICD-10-CM

## 2024-03-19 LAB — URINALYSIS, ROUTINE W REFLEX MICROSCOPIC
Bilirubin Urine: NEGATIVE
Glucose, UA: NEGATIVE mg/dL
Hgb urine dipstick: NEGATIVE
Ketones, ur: NEGATIVE mg/dL
Nitrite: NEGATIVE
Protein, ur: 30 mg/dL — AB
RBC / HPF: >50 RBC/hpf (ref 0–5)
Specific Gravity, Urine: 1.029 (ref 1.005–1.030)
pH: 5 (ref 5.0–8.0)

## 2024-03-19 NOTE — MAU Provider Note (Signed)
 History     CSN: [id number]  Arrival date and time: 03/19/24 2114   Event Date/Time   First Provider Initiated Contact with Patient 03/19/24 2218      Chief Complaint  Patient presents with   Allergic Reaction   HPI Ms. Zakeria Kulzer is a 23 y.o. year old [id number] female at [redacted]w[redacted]d weeks DiDi twin gestation who presents to MAU reporting irritation around vaginal and labial areas after using nystatin  cream.  She also reports burning with urination in the same area.  She describes it as feels raw and itches.  She reports the symptoms started yesterday.  She denies vaginal bleeding.  [facility] is 03/25/2024.  OB History     Gravida  4   Para  2   Term  2   Preterm  0   AB  1   Living  2      SAB  1   IAB  0   Ectopic  0   Multiple  0   Live Births  2           Past Medical History:  Diagnosis Date   Anemia    Angio-edema    Blood transfusion without reported diagnosis    3 units packed cells and 1 bag of iron   Chronic constipation 06/10/2012   COVID-19 04/2020   Depression    Diabetes mellitus without complication (HCC)    Dysfunctional uterine bleeding    Gallstones    GERD (gastroesophageal reflux disease) 06/10/2012   Gestational diabetes    Headache 12/09/2020   Headache 12/09/2020   High-risk pregnancy 07/13/2021   HIV (human immunodeficiency virus infection) (HCC)    HIV (human immunodeficiency virus infection) (HCC)    HIV disease (HCC) 11/01/2020   -on Biktarvy , last viral load: 07/13/21: 12,000  12/19/21: not detected   EFW @ 28, 36wks     No testing        C/S @ 38wks (VL >1K) or IOL @ 39wks (VL <1K)   HSV (herpes simplex virus) anogenital infection    Low back pain 12/09/2020   Low back pain 12/09/2020   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   Passive suicidal ideations 11/07/2020   Passive suicidal ideations 11/07/2020   Polyhydramnios 02/13/2022   Postpartum  hemorrhage 02/16/2022   Pregnancy induced hypertension    Pyelonephritis 08/19/2021   Admission 5/13-5/15   Urine cx poc 09/11/21 neg   Urticaria    Vaginal delivery 02/16/2022   Vaginal pruritus 03/15/2021   Vaginal yeast infection     Past Surgical History:  Procedure Laterality Date   NO PAST SURGERIES      Family History  Problem Relation Age of Onset   Diabetes Mother    Asthma Mother    Obesity Mother    Fibromyalgia Mother    Mental illness Mother    Other Father        not to her knowledge, re med problems   Diabetes Maternal Uncle    Asthma Maternal Grandmother    Hypertension Maternal Grandmother    Cancer Maternal Grandfather    Colon cancer Maternal Grandfather    Cancer Other    Heart disease Neg Hx     Social History[1]  Allergies: Allergies[2]  Medications Prior to Admission  Medication Sig Dispense Refill Last Dose/Taking   aspirin  81 MG chewable tablet Chew 2 tablets (162 mg total) by mouth daily. 60 tablet 7 03/19/2024   bictegravir-emtricitabine -tenofovir  AF (  BIKTARVY ) 50-200-25 MG TABS tablet Take 1 tablet by mouth daily. 30 tablet 5 03/19/2024   clotrimazole  (GYNE-LOTRIMIN ) 1 % vaginal cream Place 1 Applicatorful vaginally at bedtime for 14 days. 60 g 2 03/19/2024   cyclobenzaprine  (FLEXERIL ) 10 MG tablet Take 1 tablet (10 mg total) by mouth 3 (three) times daily as needed for muscle spasms. Can caused drowsiness! 15 tablet 0 03/18/2024   metroNIDAZOLE  (FLAGYL ) 500 MG tablet Take 1 tablet (500 mg total) by mouth 2 (two) times daily for 7 days. 14 tablet 0 03/19/2024   nystatin  cream (MYCOSTATIN ) Apply 1 Application topically 2 (two) times daily for 14 days. 28 g 0 03/18/2024   ondansetron  (ZOFRAN -ODT) 4 MG disintegrating tablet Take 1 tablet (4 mg total) by mouth every 8 (eight) hours as needed for nausea. 42 tablet 6 Past Month   polyethylene glycol powder (MIRALAX ) 17 GM/SCOOP powder Take 17 g by mouth daily. Dissolve 1  capful (17g) in 4-8 ounces of liquid and take by mouth daily. 238 g 4 Past Week   Prenatal MV & Min w/FA-DHA (PRENATAL GUMMIES PO) Take by mouth.   03/19/2024   promethazine  (PHENERGAN ) 25 MG tablet Take 0.5-1 tablets (12.5-25 mg total) by mouth every 6 (six) hours as needed. 30 tablet 6 Past Month   sertraline  (ZOLOFT ) 50 MG tablet Take half a tablet daily x1 week then take 1 full tablet daily. 30 tablet 2 03/19/2024   Blood Pressure Monitoring (BLOOD PRESSURE CUFF) MISC Use as directed to check blood pressure daily 1 each 0    ferrous sulfate  325 (65 FE) MG tablet Take 1 tablet (325 mg total) by mouth every other day. (Patient not taking: Reported on 02/27/2024) 30 tablet 3    metoCLOPramide (REGLAN) 10 MG tablet Take 1 tablet (10 mg total) by mouth 3 (three) times daily with meals. (Patient not taking: Reported on 03/19/2024) 90 tablet 1 Not Taking   senna-docusate (SENOKOT-S) 8.6-50 MG tablet Take 2 tablets by mouth daily. (Patient not taking: Reported on 03/19/2024) 60 tablet 0 Not Taking    Review of Systems Physical Exam   Blood pressure 118/72, pulse 97, temperature 98.6 F (37 C), resp. rate 17, height 5' (1.524 m), weight 81.6 kg, SpO2 99%, not currently breastfeeding.  Physical Exam  MAU Course  Procedures  MDM ***  Assessment and Plan  ***  Ala Cart, CNM 03/19/2024, 10:18 PM        [1] Social History Tobacco Use   Smoking status: Former    Types: Cigars, E-cigarettes   Smokeless tobacco: Never   Tobacco comments:    vapes  Vaping Use   Vaping status: Former   Substances: Nicotine, CBD, Flavoring  Substance Use Topics   Alcohol use: No   Drug use: Not Currently    Types: Marijuana    Comment: more than a year ago  [2] No Known Allergies

## 2024-03-19 NOTE — Discharge Instructions (Signed)
 Please use Gyne Lotrimin  cream on the outside of irritated vaginal area

## 2024-03-19 NOTE — MAU Note (Signed)
 Alyssa Sandoval is a 23 y.o. at [redacted]w[redacted]d here in MAU reporting using Nystatin  cream at vaginal area which was prescribed last time she was here. She has irritation around vagina and labia and burns with urination. Area feels raw and itches. Denies VB. Discomfort started yesterday  LMP: na Onset of complaint: yesterday Pain score: 8 Vitals:   03/19/24 2128 03/19/24 2131  BP:  118/72  Pulse: 97   Resp: 17   Temp: 98.6 F (37 C)   SpO2: 99%      FHT: 150 and 156  Lab orders placed from triage: u/a

## 2024-03-25 ENCOUNTER — Ambulatory Visit: Payer: MEDICAID | Admitting: Advanced Practice Midwife

## 2024-03-25 VITALS — BP 113/76 | HR 75 | Wt 181.0 lb

## 2024-03-25 DIAGNOSIS — O98712 Human immunodeficiency virus [HIV] disease complicating pregnancy, second trimester: Secondary | ICD-10-CM | POA: Diagnosis not present

## 2024-03-25 DIAGNOSIS — O30042 Twin pregnancy, dichorionic/diamniotic, second trimester: Secondary | ICD-10-CM

## 2024-03-25 DIAGNOSIS — Z8759 Personal history of other complications of pregnancy, childbirth and the puerperium: Secondary | ICD-10-CM

## 2024-03-25 DIAGNOSIS — O23592 Infection of other part of genital tract in pregnancy, second trimester: Secondary | ICD-10-CM

## 2024-03-25 DIAGNOSIS — Z8619 Personal history of other infectious and parasitic diseases: Secondary | ICD-10-CM | POA: Diagnosis not present

## 2024-03-25 DIAGNOSIS — B9689 Other specified bacterial agents as the cause of diseases classified elsewhere: Secondary | ICD-10-CM

## 2024-03-25 DIAGNOSIS — Z8632 Personal history of gestational diabetes: Secondary | ICD-10-CM

## 2024-03-25 DIAGNOSIS — B20 Human immunodeficiency virus [HIV] disease: Secondary | ICD-10-CM | POA: Diagnosis not present

## 2024-03-25 DIAGNOSIS — Z21 Asymptomatic human immunodeficiency virus [HIV] infection status: Secondary | ICD-10-CM

## 2024-03-25 DIAGNOSIS — O0992 Supervision of high risk pregnancy, unspecified, second trimester: Secondary | ICD-10-CM

## 2024-03-25 DIAGNOSIS — Z3A16 16 weeks gestation of pregnancy: Secondary | ICD-10-CM

## 2024-03-25 MED ORDER — REPHRESH ODOR ELIMINATING VA GEL: 1.0000 | VAGINAL | 8 refills | Status: AC

## 2024-03-25 NOTE — Progress Notes (Signed)
 HIGH-RISK PREGNANCY VISIT Patient name: Alyssa Sandoval MRN [id number]  Date of birth: April 13, 2000 Chief Complaint:   Routine Prenatal Visit  History of Present Illness:   Alyssa Sandoval is a 23 y.o. [id number] female at [redacted]w[redacted]d with an Estimated Date of Delivery: 09/08/24 being seen today for ongoing management of a high-risk pregnancy complicated by HIV.    Today she reports had BV in Oct and Dec; feels she still has it (no odor, but still has d/c) . Contractions: Irritability. Vag. Bleeding: None.   . denies leaking of fluid.      02/26/2024    3:32 PM 12/14/2022   11:10 AM 08/29/2022    3:16 PM 07/23/2022   10:06 AM 01/24/2022    9:51 AM  Depression screen PHQ 2/9  Decreased Interest 2 0 0 0 3  Down, Depressed, Hopeless 2 0 0 0 0  PHQ - 2 Score 4 0 0 0 3  Altered sleeping 3   3 3   Tired, decreased energy 3   3 3   Change in appetite 3   0 0  Feeling bad or failure about yourself  2   0 0  Trouble concentrating 0   0 0  Moving slowly or fidgety/restless 0   0 0  Suicidal thoughts 0   0 0  PHQ-9 Score 15   6  9       Data saved with a previous flowsheet row definition        02/26/2024    3:32 PM 07/23/2022   10:07 AM 01/24/2022    9:52 AM 08/09/2021    3:56 PM  GAD 7 : Generalized Anxiety Score  Nervous, Anxious, on Edge 1 1 0 1  Control/stop worrying 1 0 0 0  Worry too much - different things 3 0 0 1  Trouble relaxing 3 1 0 0  Restless 0 0 0 0  Easily annoyed or irritable 0 0 0 0  Afraid - awful might happen 0 0 0 0  Total GAD 7 Score 8 2 0 2     Review of Systems:   Pertinent items are noted in HPI Denies abnormal vaginal discharge w/ itching/odor/irritation, headaches, visual changes, shortness of breath, chest pain, abdominal pain, severe nausea/vomiting, or problems with urination or bowel movements unless otherwise stated above. Pertinent History Reviewed:  Reviewed past medical,surgical, social, obstetrical and family history.  Reviewed problem list,  medications and allergies. Physical Assessment:   Vitals:   03/25/24 1619  BP: 113/76  Pulse: 75  Weight: 181 lb (82.1 kg)  Body mass index is 35.35 kg/m.           Physical Examination:   General appearance: alert, well appearing, and in no distress  Mental status: alert, oriented to person, place, and time  Skin: warm & dry   Extremities:      Cardiovascular: normal heart rate noted  Respiratory: normal respiratory effort, no distress  Abdomen: gravid, soft, non-tender  Pelvic: Cervical exam deferred         Fetal Status: Fetal Heart Rate (bpm): 164/150        Fetal Surveillance Testing today: doppler   No results found for this or any previous visit (from the past 24 hours).  Assessment & Plan:  High-risk pregnancy: [id number] at [redacted]w[redacted]d with an Estimated Date of Delivery: 09/08/24   1) HIV+, taking Biktarvy ; needs to make ID appt; will get RNA quant and T4 helper labs today  2) DC/DA twins, has anatomy scan next  visit; growth q 4wks; testing @ 36wks  3) Hx GDM, nl early HgbA1c  4) Hx HSV, suppression @ 34wks  5) Hx PPH  6) Hx ppHTN, not taking bASA  7) Recurrent vag discharge/BV, had + testing in Oct and Dec; used gel once and MTZ once; rx RepHresh gel to use twice weekly x 4 weeks; isn't having sex  Meds:  Meds ordered this encounter  Medications   Vaginal Lubricant (REPHRESH ODOR ELIMINATING) GEL    Sig: Place 1 Application vaginally 2 (two) times a week.    Dispense:  2 g    Refill:  8    Supervising Provider:   MARILYNN NEST [[id number]]    Labs/procedures today: AFP; RNA quant and T4 cell  Treatment Plan:  growth q 4wks; testing @ 36wks  Reviewed: Preterm labor symptoms and general obstetric precautions including but not limited to vaginal bleeding, contractions, leaking of fluid and fetal movement were reviewed in detail with the patient.  All questions were answered. Does have home bp cuff. Office bp cuff given: not applicable. Check bp weekly, let us  know  if consistently >140 and/or >90.  Follow-up: Return for As scheduled (make sure anatomy u/s is for twins).   Future Appointments  Date Time Provider Department Center  03/26/2024 12:30 PM Antonetta Glade BROCKS, PT [facility] None  04/01/2024 12:30 PM Antonetta Glade C, PT [facility] None  04/07/2024  2:00 PM Antonetta Glade C, PT [facility] None  04/22/2024  2:15 PM [facility] US  [facility] None  04/22/2024  3:50 PM Loreli Suzen BIRCH, CNM [facility]    Orders Placed This Encounter  Procedures   AFP, Serum, Open Spina Bifida   HIV 1 RNA quant-no reflex-bld   T-helper cells (CD4) count   Suzen BIRCH [facility]li [facility] 03/25/2024 4:46 PM

## 2024-03-25 NOTE — Patient Instructions (Signed)
 Alyssa Sandoval, thank you for choosing our office today! We appreciate the opportunity to meet your healthcare needs. You may receive a short survey by mail, e-mail, or through [employer]. If you are happy with your care we would appreciate if you could take just a few minutes to complete the survey questions. We read all of your comments and take your feedback very seriously. Thank you again for choosing our office.  Center for [employer] Team at [facility] at [facility] ([address] [place], [place] [address]) Entrance C, located off of [address] Free 24/7 valet parking  Go to [email or web address] to register for FREE online childbirth classes  Call the office [phone]) or go to [facility] if: You begin to severe cramping Your water breaks.  Sometimes it is a big gush of fluid, sometimes it is just a trickle that keeps getting your panties wet or running down your legs You have vaginal bleeding.  It is normal to have a small amount of spotting if your cervix was checked.   [facility] Pediatricians/Family Doctors  [facility]): [address] Luba BROCKS, [phone]           [facility]: [address], [phone]                [facility]): [address], [phone] (call to ask if accepting patients) [facility] Department: [address], [place], [phone]    [facility] Pediatricians/Family Doctors [facility]): [phone], [phone] [facility]: [address] [place], [phone] [facility]: [address], [phone]  [facility] Doctors  [facility]): [phone] [facility]: [address], [phone]   [facility] Doctors [facility]: [address] [phone]  [facility] Doctors  [employer] [facility]: [phone], [phone]  Home Blood Pressure Monitoring for Patients   Your provider has recommended that you check your blood pressure (BP) at least once a week at home. If you do not have a blood pressure cuff at home, one will be provided for you. Contact your provider if you have not received your monitor within 1 week.   Helpful Tips for Accurate Home Blood Pressure Checks  Don't smoke, exercise, or drink caffeine  30 minutes before checking your BP Use the restroom before checking your BP (a full bladder can raise your pressure) Relax in a comfortable upright chair Feet on the ground Left arm resting comfortably on a flat surface at the level of your heart Legs uncrossed Back supported Sit quietly and don't talk Place the cuff on your bare arm Adjust snuggly, so that only two fingertips can fit between your skin and the top of the cuff Check 2 readings separated by at least one minute Keep a log of your BP readings For a visual, please reference this diagram: http://ccnc.care/bpdiagram  Provider Name: [facility]     Phone: [phone]  Zone 1: ALL CLEAR  Continue to monitor your symptoms:  BP reading is less than 140 (top number) or less than 90 (bottom number)  No right upper stomach pain No headaches or seeing spots No feeling nauseated or throwing up No swelling in face and hands  Zone 2: CAUTION Call your doctor's office for any of the following:  BP reading is greater than 140 (top number) or greater than  90 (bottom number)  Stomach pain under your ribs in the middle or right side Headaches or seeing spots Feeling nauseated or throwing up Swelling in face and hands  Zone 3: EMERGENCY  Seek immediate medical care if you have any of the following:  BP reading is greater than160 (top number) or greater than 110 (bottom number) Severe headaches not improving with Tylenol  Serious difficulty catching your breath Any worsening symptoms  from Zone 2     Second Trimester of Pregnancy The second trimester is from week 14 through week 27 (months 4 through 6). The second trimester is often a time when you feel your best. Your body has adjusted to being pregnant, and you begin to feel better physically. Usually, morning sickness has lessened or quit completely, you may have more energy, and you may have an increase in appetite. The second trimester is also a time when the fetus is growing rapidly. At the end of the sixth month, the fetus is about 9 inches long and weighs about 1 pounds. You will likely begin to feel the baby move (quickening) between 16 and 20 weeks of pregnancy. Body changes during your second trimester Your body continues to go through many changes during your second trimester. The changes vary from woman to woman. Your weight will continue to increase. You will notice your lower abdomen bulging out. You may begin to get stretch marks on your hips, abdomen, and breasts. You may develop headaches that can be relieved by medicines. The medicines should be approved by your health care provider. You may urinate more often because the fetus is pressing on your bladder. You may develop or continue to have heartburn as a result of your pregnancy. You may develop constipation because certain hormones are causing the muscles that push waste through your intestines to slow down. You may develop hemorrhoids or swollen, bulging veins (varicose veins). You may have back pain. This is caused by: Weight gain. Pregnancy hormones that are relaxing the joints in your pelvis. A shift in weight and the muscles that support your balance. Your breasts will continue to grow and they will continue to become tender. Your gums may bleed and may be sensitive to brushing and flossing. Dark spots or blotches (chloasma, mask of pregnancy) may develop on your face. This will likely fade after the baby is born. A dark line from your belly button  to the pubic area (linea nigra) may appear. This will likely fade after the baby is born. You may have changes in your hair. These can include thickening of your hair, rapid growth, and changes in texture. Some women also have hair loss during or after pregnancy, or hair that feels dry or thin. Your hair will most likely return to normal after your baby is born.  What to expect at prenatal visits During a routine prenatal visit: You will be weighed to make sure you and the fetus are growing normally. Your blood pressure will be taken. Your abdomen will be measured to track your baby's growth. The fetal heartbeat will be listened to. Any test results from the previous visit will be discussed.  Your health care provider may ask you: How you are feeling. If you are feeling the baby move. If you have had any abnormal symptoms, such as leaking fluid, bleeding, severe headaches, or abdominal cramping. If you are using any tobacco products, including cigarettes, chewing tobacco, and electronic cigarettes. If you have any questions.  Other tests that may be performed during  your second trimester include: Blood tests that check for: Low iron levels (anemia). High blood sugar that affects pregnant women (gestational diabetes) between 88 and 28 weeks. Rh antibodies. This is to check for a protein on red blood cells (Rh factor). Urine tests to check for infections, diabetes, or protein in the urine. An ultrasound to confirm the proper growth and development of the baby. An amniocentesis to check for possible genetic problems. Fetal screens for spina bifida and Down syndrome. HIV (human immunodeficiency virus) testing. Routine prenatal testing includes screening for HIV, unless you choose not to have this test.  Follow these instructions at home: Medicines Follow your health care provider's instructions regarding medicine use. Specific medicines may be either safe or unsafe to take during  pregnancy. Take a prenatal vitamin that contains at least 600 micrograms (mcg) of folic acid. If you develop constipation, try taking a stool softener if your health care provider approves. Eating and drinking Eat a balanced diet that includes fresh fruits and vegetables, whole grains, good sources of protein such as meat, eggs, or tofu, and low-fat dairy. Your health care provider will help you determine the amount of weight gain that is right for you. Avoid raw meat and uncooked cheese. These carry germs that can cause birth defects in the baby. If you have low calcium  intake from food, talk to your health care provider about whether you should take a daily calcium  supplement. Limit foods that are high in fat and processed sugars, such as fried and sweet foods. To prevent constipation: Drink enough fluid to keep your urine clear or pale yellow. Eat foods that are high in fiber, such as fresh fruits and vegetables, whole grains, and beans. Activity Exercise only as directed by your health care provider. Most women can continue their usual exercise routine during pregnancy. Try to exercise for 30 minutes at least 5 days a week. Stop exercising if you experience uterine contractions. Avoid heavy lifting, wear low heel shoes, and practice good posture. A sexual relationship may be continued unless your health care provider directs you otherwise. Relieving pain and discomfort Wear a good support bra to prevent discomfort from breast tenderness. Take warm sitz baths to soothe any pain or discomfort caused by hemorrhoids. Use hemorrhoid cream if your health care provider approves. Rest with your legs elevated if you have leg cramps or low back pain. If you develop varicose veins, wear support hose. Elevate your feet for 15 minutes, 3-4 times a day. Limit salt in your diet. Prenatal Care Write down your questions. Take them to your prenatal visits. Keep all your prenatal visits as told by your health  care provider. This is important. Safety Wear your seat belt at all times when driving. Make a list of emergency phone numbers, including numbers for family, friends, the hospital, and police and fire departments. General instructions Ask your health care provider for a referral to a local prenatal education class. Begin classes no later than the beginning of month 6 of your pregnancy. Ask for help if you have counseling or nutritional needs during pregnancy. Your health care provider can offer advice or refer you to specialists for help with various needs. Do not use hot tubs, steam rooms, or saunas. Do not douche or use tampons or scented sanitary pads. Do not cross your legs for long periods of time. Avoid cat litter boxes and soil used by cats. These carry germs that can cause birth defects in the baby and possibly loss of the  fetus by miscarriage or stillbirth. Avoid all smoking, herbs, alcohol, and unprescribed drugs. Chemicals in these products can affect the formation and growth of the baby. Do not use any products that contain nicotine or tobacco, such as cigarettes and e-cigarettes. If you need help quitting, ask your health care provider. Visit your dentist if you have not gone yet during your pregnancy. Use a soft toothbrush to brush your teeth and be gentle when you floss. Contact a health care provider if: You have dizziness. You have mild pelvic cramps, pelvic pressure, or nagging pain in the abdominal area. You have persistent nausea, vomiting, or diarrhea. You have a bad smelling vaginal discharge. You have pain when you urinate. Get help right away if: You have a fever. You are leaking fluid from your vagina. You have spotting or bleeding from your vagina. You have severe abdominal cramping or pain. You have rapid weight gain or weight loss. You have shortness of breath with chest pain. You notice sudden or extreme swelling of your face, hands, ankles, feet, or legs. You  have not felt your baby move in over an hour. You have severe headaches that do not go away when you take medicine. You have vision changes. Summary The second trimester is from week 14 through week 27 (months 4 through 6). It is also a time when the fetus is growing rapidly. Your body goes through many changes during pregnancy. The changes vary from woman to woman. Avoid all smoking, herbs, alcohol, and unprescribed drugs. These chemicals affect the formation and growth your baby. Do not use any tobacco products, such as cigarettes, chewing tobacco, and e-cigarettes. If you need help quitting, ask your health care provider. Contact your health care provider if you have any questions. Keep all prenatal visits as told by your health care provider. This is important. This information is not intended to replace advice given to you by your health care provider. Make sure you discuss any questions you have with your health care provider. Document Released: 03/20/2001 Document Revised: 09/01/2015 Document Reviewed: 05/27/2012 Elsevier Interactive Patient Education  2017 [employer].

## 2024-03-26 ENCOUNTER — Ambulatory Visit: Payer: MEDICAID | Admitting: Physical Therapy

## 2024-03-27 ENCOUNTER — Other Ambulatory Visit: Payer: Self-pay

## 2024-03-27 ENCOUNTER — Inpatient Hospital Stay (HOSPITAL_COMMUNITY)
Admission: AD | Admit: 2024-03-27 | Discharge: 2024-03-27 | Disposition: A | Discharge status: Home / Self Care | Payer: MEDICAID | Attending: Obstetrics and Gynecology | Admitting: Obstetrics and Gynecology

## 2024-03-27 DIAGNOSIS — K8050 Calculus of bile duct without cholangitis or cholecystitis without obstruction: Secondary | ICD-10-CM | POA: Diagnosis not present

## 2024-03-27 DIAGNOSIS — O99612 Diseases of the digestive system complicating pregnancy, second trimester: Secondary | ICD-10-CM | POA: Insufficient documentation

## 2024-03-27 DIAGNOSIS — O30043 Twin pregnancy, dichorionic/diamniotic, third trimester: Secondary | ICD-10-CM | POA: Insufficient documentation

## 2024-03-27 DIAGNOSIS — Z3A16 16 weeks gestation of pregnancy: Secondary | ICD-10-CM

## 2024-03-27 DIAGNOSIS — O26892 Other specified pregnancy related conditions, second trimester: Secondary | ICD-10-CM | POA: Diagnosis not present

## 2024-03-27 DIAGNOSIS — R1011 Right upper quadrant pain: Secondary | ICD-10-CM | POA: Diagnosis present

## 2024-03-27 DIAGNOSIS — K8020 Calculus of gallbladder without cholecystitis without obstruction: Secondary | ICD-10-CM

## 2024-03-27 LAB — CBC WITH DIFFERENTIAL/PLATELET
Abs Immature Granulocytes: 0.06 K/uL (ref 0.00–0.07)
Basophils Absolute: 0 K/uL (ref 0.0–0.1)
Basophils Relative: 0 %
Eosinophils Absolute: 0.1 K/uL (ref 0.0–0.5)
Eosinophils Relative: 1 %
HCT: 33.7 % — ABNORMAL LOW (ref 36.0–46.0)
Hemoglobin: 11.3 g/dL — ABNORMAL LOW (ref 12.0–15.0)
Immature Granulocytes: 1 %
Lymphocytes Relative: 25 %
Lymphs Abs: 3.3 K/uL (ref 0.7–4.0)
MCH: 26.5 pg (ref 26.0–34.0)
MCHC: 33.5 g/dL (ref 30.0–36.0)
MCV: 78.9 fL — ABNORMAL LOW (ref 80.0–100.0)
Monocytes Absolute: 0.8 K/uL (ref 0.1–1.0)
Monocytes Relative: 6 %
Neutro Abs: 8.6 K/uL — ABNORMAL HIGH (ref 1.7–7.7)
Neutrophils Relative %: 67 %
Platelets: 351 K/uL (ref 150–400)
RBC: 4.27 MIL/uL (ref 3.87–5.11)
RDW: 15.1 % (ref 11.5–15.5)
WBC: 12.8 K/uL — ABNORMAL HIGH (ref 4.0–10.5)
nRBC: 0 % (ref 0.0–0.2)

## 2024-03-27 LAB — AFP, SERUM, OPEN SPINA BIFIDA
AFP MoM: 2.38
AFP Value: 76.3 ng/mL
Gest. Age on Collection Date: 16 wk
Maternal Age At EDD: 24.9 a
OSBR Risk 1 IN: 1719
Test Results:: NEGATIVE
Weight: 181 [lb_av]

## 2024-03-27 LAB — COMPREHENSIVE METABOLIC PANEL WITH GFR
ALT: 21 U/L (ref 0–44)
AST: 18 U/L (ref 15–41)
Albumin: 3.8 g/dL (ref 3.5–5.0)
Alkaline Phosphatase: 85 U/L (ref 38–126)
Anion gap: 10 (ref 5–15)
BUN: 8 mg/dL (ref 6–20)
CO2: 21 mmol/L — ABNORMAL LOW (ref 22–32)
Calcium: 9.4 mg/dL (ref 8.9–10.3)
Chloride: 104 mmol/L (ref 98–111)
Creatinine, Ser: 0.51 mg/dL (ref 0.44–1.00)
GFR, Estimated: >60 mL/min
Glucose, Bld: 90 mg/dL (ref 70–99)
Potassium: 3.8 mmol/L (ref 3.5–5.1)
Sodium: 135 mmol/L (ref 135–145)
Total Bilirubin: <0.2 mg/dL (ref 0.0–1.2)
Total Protein: 7.1 g/dL (ref 6.5–8.1)

## 2024-03-27 LAB — URINALYSIS, ROUTINE W REFLEX MICROSCOPIC
Bilirubin Urine: NEGATIVE
Glucose, UA: NEGATIVE mg/dL
Hgb urine dipstick: NEGATIVE
Ketones, ur: 15 mg/dL — AB
Leukocytes,Ua: NEGATIVE
Nitrite: NEGATIVE
Protein, ur: NEGATIVE mg/dL
Specific Gravity, Urine: >1.03 — ABNORMAL HIGH (ref 1.005–1.030)
pH: 6 (ref 5.0–8.0)

## 2024-03-27 LAB — HIV-1 RNA QUANT-NO REFLEX-BLD
HIV-1 RNA Viral Load Log: 1.845 {Log_copies}/mL
HIV-1 RNA Viral Load: 70 {copies}/mL

## 2024-03-27 MED ORDER — OXYCODONE HCL 5 MG PO TABS
5.0000 mg | ORAL_TABLET | Freq: Once | ORAL | Status: AC
  Administered 2024-03-27: 5 mg via ORAL
  Filled 2024-03-27: qty 1

## 2024-03-27 MED ORDER — OXYCODONE HCL 5 MG PO TABS: 5.0000 mg | ORAL_TABLET | ORAL | 0 refills | Status: DC | PRN

## 2024-03-27 MED ORDER — ONDANSETRON 4 MG PO TBDP: 4.0000 mg | ORAL_TABLET | Freq: Three times a day (TID) | ORAL | 6 refills | Status: AC | PRN

## 2024-03-27 NOTE — Discharge Instructions (Addendum)
 Your pain is a result of gallstones and an irritated gallbladder.  Stop eating and drinking for 12-24 hours other than water and clear liquids. This helps to cool down your gallbladder. After 1 or 2 days, eat a diet of simple or clear foods, such as broths and crackers. Take medicines for pain or nausea. Zofran  for nausea. You can take Tylenol  1000 mg every 6 hours for pain. I have also sent oxycodone  for breakthrough severe pain if needed. Drink enough fluid to keep your urine pale yellow. Follow instructions from your health care provider about eating or drinking restrictions. These may include avoiding: Fatty, greasy, and fried foods. Any foods that make the pain worse. Overeating. Having a large meal after not eating for a while.  Get help right away if: You have pain in your abdomen is getting worse even though you are not eating and are taking pain medicine. You have a fever or chills. You have vomiting that does not go away. You develop jaundice.

## 2024-03-27 NOTE — MAU Note (Signed)
 Alyssa Sandoval is a 23 y.o. at [redacted]w[redacted]d here in MAU reporting: pain on upper right side of abdomen - stabbing pain that is constant. Took tylenol  2 hours ago. Denies VB or LOF.   Onset of complaint: this morning  Pain score: 8 Vitals:   03/27/24 1932  BP: 119/77  Pulse: 99  Resp: 16  Temp: 98.7 F (37.1 C)  SpO2: 100%     FHT: twin gestation  Lab orders placed from triage: UA

## 2024-03-27 NOTE — MAU Provider Note (Signed)
 Chief Complaint:  Abdominal Pain   HPI   None     Alyssa Sandoval is a 23 y.o. [id number] at [redacted]w[redacted]d who presents to maternity admissions reporting RUQ pain.  She reports stabbing RUQ pain started this morning and has been occurring after she eats.  She took Tylenol  about 2 hours ago.  Denies vaginal bleeding or leaking of fluid.  She has a known history of cholelithiasis.  Pregnancy Course: Receives care at [facility] for [facility] . Prenatal records reviewed. Di-di twins.  Past Medical History:  Diagnosis Date   Anemia    Angio-edema    Blood transfusion without reported diagnosis    3 units packed cells and 1 bag of iron   Chronic constipation 06/10/2012   COVID-19 04/2020   Depression    Diabetes mellitus without complication (HCC)    Dysfunctional uterine bleeding    Gallstones    GERD (gastroesophageal reflux disease) 06/10/2012   Gestational diabetes    Headache 12/09/2020   Headache 12/09/2020   High-risk pregnancy 07/13/2021   HIV (human immunodeficiency virus infection) (HCC)    HIV (human immunodeficiency virus infection) (HCC)    HIV disease (HCC) 11/01/2020   -on Biktarvy , last viral load: 07/13/21: 12,000  12/19/21: not detected   EFW @ 28, 36wks     No testing        C/S @ 38wks (VL >1K) or IOL @ 39wks (VL <1K)   HSV (herpes simplex virus) anogenital infection    Low back pain 12/09/2020   Low back pain 12/09/2020   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   Passive suicidal ideations 11/07/2020   Passive suicidal ideations 11/07/2020   Polyhydramnios 02/13/2022   Postpartum hemorrhage 02/16/2022   Pregnancy induced hypertension    Pyelonephritis 08/19/2021   Admission 5/13-5/15   Urine cx poc 09/11/21 neg   Urticaria    Vaginal delivery 02/16/2022   Vaginal pruritus 03/15/2021   Vaginal yeast infection    OB History  Gravida Para Term Preterm AB Living  4 2 2  0 1 2  SAB IAB Ectopic Multiple Live Births  1 0 0 0 2    # Outcome Date GA Lbr Len/2nd  Weight Sex Type Anes PTL Lv  4 Current           3 Term 01/10/23 [redacted]w[redacted]d / 00:09 3120 g F Vag-Spont EPI  LIV     Complications: Gestational diabetes, Postpartum hemorrhage  2 Term 02/14/22 [redacted]w[redacted]d 04:49 / 03:39 3220 g M Vag-Spont EPI  LIV     Complications: Postpartum hypertension, Postpartum hemorrhage  1 SAB 2020           Past Surgical History:  Procedure Laterality Date   NO PAST SURGERIES     Family History  Problem Relation Age of Onset   Diabetes Mother    Asthma Mother    Obesity Mother    Fibromyalgia Mother    Mental illness Mother    Other Father        not to her knowledge, re med problems   Diabetes Maternal Uncle    Asthma Maternal Grandmother    Hypertension Maternal Grandmother    Cancer Maternal Grandfather    Colon cancer Maternal Grandfather    Cancer Other    Heart disease Neg Hx    Social History[1] Allergies[2] Medications Prior to Admission  Medication Sig Dispense Refill Last Dose/Taking   bictegravir-emtricitabine -tenofovir  AF (BIKTARVY ) 50-200-25 MG TABS tablet Take 1 tablet by mouth daily. 30 tablet 5  03/26/2024   cyclobenzaprine  (FLEXERIL ) 10 MG tablet Take 1 tablet (10 mg total) by mouth 3 (three) times daily as needed for muscle spasms. Can caused drowsiness! 15 tablet 0 03/27/2024   Prenatal MV & Min w/FA-DHA (PRENATAL GUMMIES PO) Take by mouth.   03/26/2024   sertraline  (ZOLOFT ) 50 MG tablet Take half a tablet daily x1 week then take 1 full tablet daily. 30 tablet 2 03/26/2024   aspirin  81 MG chewable tablet Chew 2 tablets (162 mg total) by mouth daily. (Patient not taking: Reported on 03/25/2024) 60 tablet 7    Blood Pressure Monitoring (BLOOD PRESSURE CUFF) MISC Use as directed to check blood pressure daily 1 each 0    clotrimazole  (GYNE-LOTRIMIN ) 1 % vaginal cream Place 1 Applicatorful vaginally at bedtime for 14 days. (Patient not taking: Reported on 03/25/2024) 60 g 2    ferrous sulfate  325 (65 FE) MG tablet Take 1 tablet (325 mg total) by  mouth every other day. (Patient not taking: Reported on 03/25/2024) 30 tablet 3    metoCLOPramide (REGLAN) 10 MG tablet Take 1 tablet (10 mg total) by mouth 3 (three) times daily with meals. (Patient not taking: Reported on 03/25/2024) 90 tablet 1    ondansetron  (ZOFRAN -ODT) 4 MG disintegrating tablet Take 1 tablet (4 mg total) by mouth every 8 (eight) hours as needed for nausea. 42 tablet 6    polyethylene glycol powder (MIRALAX ) 17 GM/SCOOP powder Take 17 g by mouth daily. Dissolve 1 capful (17g) in 4-8 ounces of liquid and take by mouth daily. (Patient not taking: Reported on 03/25/2024) 238 g 4    promethazine  (PHENERGAN ) 25 MG tablet Take 0.5-1 tablets (12.5-25 mg total) by mouth every 6 (six) hours as needed. 30 tablet 6    senna-docusate (SENOKOT-S) 8.6-50 MG tablet Take 2 tablets by mouth daily. (Patient not taking: Reported on 03/25/2024) 60 tablet 0    Vaginal Lubricant (REPHRESH ODOR ELIMINATING) GEL Place 1 Application vaginally 2 (two) times a week. 2 g 8     I have reviewed patient's Past Medical Hx, Surgical Hx, Family Hx, Social Hx, medications and allergies.   ROS  Pertinent items noted in HPI and remainder of comprehensive ROS otherwise negative.   PHYSICAL EXAM  Patient Vitals for the past 24 hrs:  BP Temp Temp src Pulse Resp SpO2 Height Weight  03/27/24 1932 119/77 98.7 F (37.1 C) Oral 99 16 100 % 5' (1.524 m) 81.2 kg    Constitutional: Well-developed, well-nourished female in no acute distress.  HEENT: atraumatic, normocephalic. Neck has normal ROM. EOM intact. Cardiovascular: normal rate & rhythm, warm and well-perfused Respiratory: normal effort, no problems with respiration noted GI: Abd soft, non-distended. RUQ tenderness. MSK: Extremities nontender, no edema, normal ROM Skin: warm and dry. Acyanotic, no jaundice or pallor. Neurologic: Alert and oriented x 4. No abnormal coordination. Psychiatric: Normal mood. Speech not slurred, not rapid/pressured. Patient is  cooperative.  Labs: Results for orders placed or performed during the hospital encounter of 03/27/24 (from the past 24 hours)  Urinalysis, Routine w reflex microscopic -Urine, Clean Catch     Status: Abnormal   Collection Time: 03/27/24  7:37 PM  Result Value Ref Range   Color, Urine YELLOW YELLOW   APPearance CLEAR CLEAR   Specific Gravity, Urine >1.030 (H) 1.005 - 1.030   pH 6.0 5.0 - 8.0   Glucose, UA NEGATIVE NEGATIVE mg/dL   Hgb urine dipstick NEGATIVE NEGATIVE   Bilirubin Urine NEGATIVE NEGATIVE   Ketones, ur 15 (A)  NEGATIVE mg/dL   Protein, ur NEGATIVE NEGATIVE mg/dL   Nitrite NEGATIVE NEGATIVE   Leukocytes,Ua NEGATIVE NEGATIVE  Comprehensive metabolic panel     Status: Abnormal   Collection Time: 03/27/24  8:31 PM  Result Value Ref Range   Sodium 135 135 - 145 mmol/L   Potassium 3.8 3.5 - 5.1 mmol/L   Chloride 104 98 - 111 mmol/L   CO2 21 (L) 22 - 32 mmol/L   Glucose, Bld 90 70 - 99 mg/dL   BUN 8 6 - 20 mg/dL   Creatinine, Ser 9.48 0.44 - 1.00 mg/dL   Calcium  9.4 8.9 - 10.3 mg/dL   Total Protein 7.1 6.5 - 8.1 g/dL   Albumin  3.8 3.5 - 5.0 g/dL   AST 18 15 - 41 U/L   ALT 21 0 - 44 U/L   Alkaline Phosphatase 85 38 - 126 U/L   Total Bilirubin <0.2 0.0 - 1.2 mg/dL   GFR, Estimated >39 >39 mL/min   Anion gap 10 5 - 15  CBC with Differential/Platelet     Status: Abnormal   Collection Time: 03/27/24  8:31 PM  Result Value Ref Range   WBC 12.8 (H) 4.0 - 10.5 K/uL   RBC 4.27 3.87 - 5.11 MIL/uL   Hemoglobin 11.3 (L) 12.0 - 15.0 g/dL   HCT 66.2 (L) 63.9 - 53.9 %   MCV 78.9 (L) 80.0 - 100.0 fL   MCH 26.5 26.0 - 34.0 pg   MCHC 33.5 30.0 - 36.0 g/dL   RDW 84.8 88.4 - 84.4 %   Platelets 351 150 - 400 K/uL   nRBC 0.0 0.0 - 0.2 %   Neutrophils Relative % 67 %   Neutro Abs 8.6 (H) 1.7 - 7.7 K/uL   Lymphocytes Relative 25 %   Lymphs Abs 3.3 0.7 - 4.0 K/uL   Monocytes Relative 6 %   Monocytes Absolute 0.8 0.1 - 1.0 K/uL   Eosinophils Relative 1 %   Eosinophils Absolute 0.1  0.0 - 0.5 K/uL   Basophils Relative 0 %   Basophils Absolute 0.0 0.0 - 0.1 K/uL   Immature Granulocytes 1 %   Abs Immature Granulocytes 0.06 0.00 - 0.07 K/uL    Imaging:  No results found.  MDM & MAU COURSE  MDM: Moderate  MAU Course: -Vital signs within normal limits. Normotensive. -CBC and CMP to rule out acute cholecystitis. -Too soon to repeat Tylenol . Will give oxycodone  5 mg. -CMP within normal limits. No evidence of hepatobiliary obstruction. -CBC with mildly elevated WBC, can be normal finding in pregnancy. -UA shows dehydration. -Presentation and labs consistent with biliary colic from known cholelithiasis.   Differential diagnosis considered for upper abdominal pain includes but is not limited to: cholecystitis, biliary colic, preeclampsia, appendicitis, abdominal hernia, constipation, pancreatitis  Orders Placed This Encounter  Procedures   Urinalysis, Routine w reflex microscopic -Urine, Clean Catch   Comprehensive metabolic panel   CBC with Differential/Platelet   Discharge patient   Meds ordered this encounter  Medications   oxyCODONE  (OXY IR/ROXICODONE ) 5 MG immediate release tablet    Sig: Take 1 tablet (5 mg total) by mouth every 4 (four) hours as needed for severe pain (pain score 7-10) or breakthrough pain.    Dispense:  5 tablet    Refill:  0   oxyCODONE  (Oxy IR/ROXICODONE ) immediate release tablet 5 mg    Refill:  0    ASSESSMENT   1. Calculus of gallbladder without cholecystitis without obstruction   2. Biliary colic  3. [redacted] weeks gestation of pregnancy     PLAN  Discharge home in stable condition with return precautions.  Water and clear fluids for 12-24 hours. Tylenol  for pain management. Oxycodone  for breakthrough pain. Avoid triggering foods.     Allergies as of 03/27/2024   No Known Allergies      Medication List     TAKE these medications    aspirin  81 MG chewable tablet Chew 2 tablets (162 mg total) by mouth daily.    Biktarvy  50-200-25 MG Tabs tablet Generic drug: bictegravir-emtricitabine -tenofovir  AF Take 1 tablet by mouth daily.   Blood Pressure Cuff Misc Use as directed to check blood pressure daily   clotrimazole  1 % vaginal cream Commonly known as: GYNE-LOTRIMIN  Place 1 Applicatorful vaginally at bedtime for 14 days.   cyclobenzaprine  10 MG tablet Commonly known as: FLEXERIL  Take 1 tablet (10 mg total) by mouth 3 (three) times daily as needed for muscle spasms. Can caused drowsiness!   ferrous sulfate  325 (65 FE) MG tablet Take 1 tablet (325 mg total) by mouth every other day.   metoCLOPramide 10 MG tablet Commonly known as: REGLAN Take 1 tablet (10 mg total) by mouth 3 (three) times daily with meals.   ondansetron  4 MG disintegrating tablet Commonly known as: ZOFRAN -ODT Take 1 tablet (4 mg total) by mouth every 8 (eight) hours as needed for nausea.   oxyCODONE  5 MG immediate release tablet Commonly known as: Oxy IR/ROXICODONE  Take 1 tablet (5 mg total) by mouth every 4 (four) hours as needed for severe pain (pain score 7-10) or breakthrough pain.   polyethylene glycol powder 17 GM/SCOOP powder Commonly known as: MiraLax  Take 17 g by mouth daily. Dissolve 1 capful (17g) in 4-8 ounces of liquid and take by mouth daily.   PRENATAL GUMMIES PO Take by mouth.   promethazine  25 MG tablet Commonly known as: PHENERGAN  Take 0.5-1 tablets (12.5-25 mg total) by mouth every 6 (six) hours as needed.   Rephresh Odor Eliminating Gel Place 1 Application vaginally 2 (two) times a week.   senna-docusate 8.6-50 MG tablet Commonly known as: Senokot-S Take 2 tablets by mouth daily.   sertraline  50 MG tablet Commonly known as: ZOLOFT  Take half a tablet daily x1 week then take 1 full tablet daily.        Joesph DELENA Sear, PA      [1]  Social History Tobacco Use   Smoking status: Former    Types: Cigars, E-cigarettes   Smokeless tobacco: Never   Tobacco comments:    vapes   Vaping Use   Vaping status: Former   Substances: Nicotine, CBD, Flavoring  Substance Use Topics   Alcohol use: No   Drug use: Not Currently    Types: Marijuana    Comment: more than a year ago  [2] No Known Allergies

## 2024-03-31 ENCOUNTER — Telehealth: Payer: Self-pay | Admitting: *Deleted

## 2024-03-31 DIAGNOSIS — K8020 Calculus of gallbladder without cholecystitis without obstruction: Secondary | ICD-10-CM

## 2024-03-31 MED ORDER — OXYCODONE-ACETAMINOPHEN 5-325 MG PO TABS: 1.0000 | ORAL_TABLET | Freq: Four times a day (QID) | ORAL | 0 refills | Status: DC | PRN

## 2024-03-31 NOTE — Telephone Encounter (Signed)
 Received referral from Dr. Jayne for cholelithiasis.   Appointment scheduled with Dr. Mavis.   Surgery posted for 04/06/2024.

## 2024-03-31 NOTE — Addendum Note (Signed)
 Addended by: JAYNE VONN DEL on: 03/31/2024 02:50 PM   Modules accepted: Orders

## 2024-04-01 ENCOUNTER — Encounter: Payer: Self-pay | Admitting: General Surgery

## 2024-04-01 ENCOUNTER — Ambulatory Visit: Payer: MEDICAID | Admitting: Physical Therapy

## 2024-04-01 ENCOUNTER — Ambulatory Visit: Payer: MEDICAID | Admitting: General Surgery

## 2024-04-01 VITALS — BP 108/76 | HR 91 | Temp 98.4°F | Resp 14 | Ht 60.0 in | Wt 181.0 lb

## 2024-04-01 DIAGNOSIS — K8020 Calculus of gallbladder without cholecystitis without obstruction: Secondary | ICD-10-CM | POA: Diagnosis not present

## 2024-04-01 NOTE — Progress Notes (Signed)
 Alyssa Sandoval; [id number]; 24-Aug-2000   HPI Patient is a 23 year old black female who is 17 weeks and 1 day pregnant with twins who presents with recurrent episodes of right upper quadrant abdominal pain, nausea, and vomiting.  Patient has been followed by Dr. Jayne of OB/GYN.  She is having ongoing episodes of biliary colic with food despite medical therapy.  It is felt that she will not be able to maintain p.o. intake due to her cholelithiasis.  She denies any fever, chills, or jaundice.  She does have a little bit of a head cold which is resolving.  She does have a history of HIV. Past Medical History:  Diagnosis Date   Anemia    Angio-edema    Blood transfusion without reported diagnosis    3 units packed cells and 1 bag of iron   Chronic constipation 06/10/2012   COVID-19 04/2020   Depression    Diabetes mellitus without complication (HCC)    Dysfunctional uterine bleeding    Gallstones    GERD (gastroesophageal reflux disease) 06/10/2012   Gestational diabetes    Headache 12/09/2020   Headache 12/09/2020   High-risk pregnancy 07/13/2021   HIV (human immunodeficiency virus infection) (HCC)    HIV (human immunodeficiency virus infection) (HCC)    HIV disease (HCC) 11/01/2020   -on Biktarvy , last viral load: 07/13/21: 12,000  12/19/21: not detected   EFW @ 28, 36wks     No testing        C/S @ 38wks (VL >1K) or IOL @ 39wks (VL <1K)   HSV (herpes simplex virus) anogenital infection    Low back pain 12/09/2020   Low back pain 12/09/2020   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   Passive suicidal ideations 11/07/2020   Passive suicidal ideations 11/07/2020   Polyhydramnios 02/13/2022   Postpartum hemorrhage 02/16/2022   Pregnancy induced hypertension    Pyelonephritis 08/19/2021   Admission 5/13-5/15   Urine cx poc 09/11/21 neg   Urticaria    Vaginal delivery 02/16/2022   Vaginal pruritus 03/15/2021   Vaginal yeast infection     Past Surgical History:  Procedure Laterality Date   NO  PAST SURGERIES      Family History  Problem Relation Age of Onset   Diabetes Mother    Asthma Mother    Obesity Mother    Fibromyalgia Mother    Mental illness Mother    Other Father        not to her knowledge, re med problems   Diabetes Maternal Uncle    Asthma Maternal Grandmother    Hypertension Maternal Grandmother    Cancer Maternal Grandfather    Colon cancer Maternal Grandfather    Cancer Other    Heart disease Neg Hx     Medications Ordered Prior to Encounter[1]  Allergies[2]  Social History   Substance and Sexual Activity  Alcohol Use No    Tobacco Use History[3]  Review of Systems  Constitutional: Negative.   HENT: Negative.    Eyes: Negative.   Respiratory: Negative.    Cardiovascular: Negative.   Gastrointestinal:  Positive for abdominal pain, nausea and vomiting.  Genitourinary: Negative.   Musculoskeletal:  Positive for back pain.  Skin: Negative.   Neurological: Negative.   Endo/Heme/Allergies: Negative.   Psychiatric/Behavioral: Negative.      Objective   Vitals:   04/01/24 0905  BP: 108/76  Pulse: 91  Resp: 14  Temp: 98.4 F (36.9 C)  SpO2: 95%    Physical Exam Vitals reviewed.  Constitutional:  Appearance: Normal appearance. She is not ill-appearing.  HENT:     Head: Normocephalic and atraumatic.  Eyes:     General: No scleral icterus. Cardiovascular:     Rate and Rhythm: Normal rate and regular rhythm.     Heart sounds: Normal heart sounds. No murmur heard.    No friction rub. No gallop.  Pulmonary:     Effort: Pulmonary effort is normal. No respiratory distress.     Breath sounds: Normal breath sounds. No stridor. No wheezing, rhonchi or rales.  Abdominal:     General: There is no distension.     Palpations: Abdomen is soft. There is no mass.     Tenderness: There is no abdominal tenderness. There is no guarding.     Hernia: No hernia is present.     Comments: Due to body habitus, it was difficult to appreciate  fundal height, but it appears to be below the umbilicus.  Skin:    General: Skin is warm and dry.  Neurological:     Mental Status: She is alert and oriented to person, place, and time.   Recent OB/GYN notes reviewed  Assessment  Biliary colic secondary to cholelithiasis, pregnant with twins at 17 weeks. Plan  Patient is scheduled for a robotic assisted laparoscopic cholecystectomy on 04/06/2024.  The risks and benefits of the procedure including bleeding, infection, hepatobiliary injury, uterine injury, the possibility of an open procedure, and the possibility of miscarriage were fully explained to the patient, who gave informed consent.  She is fully aware of these risks and wants to proceed with surgery.    [1]  Current Outpatient Medications on File Prior to Visit  Medication Sig Dispense Refill   acetaminophen  (TYLENOL ) 500 MG tablet Take 1,000 mg by mouth every 6 (six) hours as needed for mild pain (pain score 1-3) or moderate pain (pain score 4-6).     bictegravir-emtricitabine -tenofovir  AF (BIKTARVY ) 50-200-25 MG TABS tablet Take 1 tablet by mouth daily. 30 tablet 5   Blood Pressure Monitoring (BLOOD PRESSURE CUFF) MISC Use as directed to check blood pressure daily 1 each 0   ferrous sulfate  325 (65 FE) MG tablet Take 1 tablet (325 mg total) by mouth every other day. 30 tablet 3   MELATONIN PO Take 1 each by mouth at bedtime. gummy     ondansetron  (ZOFRAN -ODT) 4 MG disintegrating tablet Take 1-2 tablets (4-8 mg total) by mouth every 8 (eight) hours as needed for nausea. 42 tablet 6   oxyCODONE -acetaminophen  (PERCOCET) 5-325 MG tablet Take 1 tablet by mouth every 6 (six) hours as needed for severe pain (pain score 7-10). 21 tablet 0   Prenatal MV & Min w/FA-DHA (PRENATAL GUMMIES PO) Take 2 each by mouth daily. gummies     sertraline  (ZOLOFT ) 50 MG tablet Take half a tablet daily x1 week then take 1 full tablet daily. (Patient taking differently: Take 25 mg by mouth daily.) 30 tablet 2    aspirin  81 MG chewable tablet Chew 2 tablets (162 mg total) by mouth daily. (Patient not taking: Reported on 04/01/2024) 60 tablet 7   cyclobenzaprine  (FLEXERIL ) 10 MG tablet Take 1 tablet (10 mg total) by mouth 3 (three) times daily as needed for muscle spasms. Can caused drowsiness! (Patient not taking: Reported on 04/01/2024) 15 tablet 0   metoCLOPramide (REGLAN) 10 MG tablet Take 1 tablet (10 mg total) by mouth 3 (three) times daily with meals. (Patient not taking: Reported on 04/01/2024) 90 tablet 1   polyethylene glycol powder (  MIRALAX ) 17 GM/SCOOP powder Take 17 g by mouth daily. Dissolve 1 capful (17g) in 4-8 ounces of liquid and take by mouth daily. (Patient not taking: Reported on 04/01/2024) 238 g 4   promethazine  (PHENERGAN ) 25 MG tablet Take 0.5-1 tablets (12.5-25 mg total) by mouth every 6 (six) hours as needed. (Patient not taking: Reported on 04/01/2024) 30 tablet 6   senna-docusate (SENOKOT-S) 8.6-50 MG tablet Take 2 tablets by mouth daily. (Patient not taking: Reported on 04/01/2024) 60 tablet 0   Vaginal Lubricant (REPHRESH ODOR ELIMINATING) GEL Place 1 Application vaginally 2 (two) times a week. (Patient not taking: Reported on 04/01/2024) 2 g 8   [DISCONTINUED] norelgestromin -ethinyl estradiol  (ORTHO EVRA) 150-35 MCG/24HR transdermal patch Place 1 patch onto the skin once a week. 3 patch 12   No current facility-administered medications on file prior to visit.  [2] No Known Allergies [3]  Social History Tobacco Use  Smoking Status Former   Types: Cigars, E-cigarettes  Smokeless Tobacco Never  Tobacco Comments   vapes

## 2024-04-01 NOTE — H&P (Signed)
 Alyssa Sandoval; [id number]; 2000-10-31   HPI Patient is a 23 year old black female who is 17 weeks and 1 day pregnant with twins who presents with recurrent episodes of right upper quadrant abdominal pain, nausea, and vomiting.  Patient has been followed by Dr. Jayne of OB/GYN.  She is having ongoing episodes of biliary colic with food despite medical therapy.  It is felt that she will not be able to maintain p.o. intake due to her cholelithiasis.  She denies any fever, chills, or jaundice.  She does have a little bit of a head cold which is resolving.  She does have a history of HIV. Past Medical History:  Diagnosis Date   Anemia    Angio-edema    Blood transfusion without reported diagnosis    3 units packed cells and 1 bag of iron   Chronic constipation 06/10/2012   COVID-19 04/2020   Depression    Diabetes mellitus without complication (HCC)    Dysfunctional uterine bleeding    Gallstones    GERD (gastroesophageal reflux disease) 06/10/2012   Gestational diabetes    Headache 12/09/2020   Headache 12/09/2020   High-risk pregnancy 07/13/2021   HIV (human immunodeficiency virus infection) (HCC)    HIV (human immunodeficiency virus infection) (HCC)    HIV disease (HCC) 11/01/2020   -on Biktarvy , last viral load: 07/13/21: 12,000  12/19/21: not detected   EFW @ 28, 36wks     No testing        C/S @ 38wks (VL >1K) or IOL @ 39wks (VL <1K)   HSV (herpes simplex virus) anogenital infection    Low back pain 12/09/2020   Low back pain 12/09/2020   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   Passive suicidal ideations 11/07/2020   Passive suicidal ideations 11/07/2020   Polyhydramnios 02/13/2022   Postpartum hemorrhage 02/16/2022   Pregnancy induced hypertension    Pyelonephritis 08/19/2021   Admission 5/13-5/15   Urine cx poc 09/11/21 neg   Urticaria    Vaginal delivery 02/16/2022   Vaginal pruritus 03/15/2021   Vaginal yeast infection     Past Surgical History:  Procedure Laterality Date   NO  PAST SURGERIES      Family History  Problem Relation Age of Onset   Diabetes Mother    Asthma Mother    Obesity Mother    Fibromyalgia Mother    Mental illness Mother    Other Father        not to her knowledge, re med problems   Diabetes Maternal Uncle    Asthma Maternal Grandmother    Hypertension Maternal Grandmother    Cancer Maternal Grandfather    Colon cancer Maternal Grandfather    Cancer Other    Heart disease Neg Hx     [Medications Ordered Prior to [employer] Ordered Prior to [employer] Current Outpatient Medications on File Prior to Visit  Medication Sig Dispense Refill   acetaminophen  (TYLENOL ) 500 MG tablet Take 1,000 mg by mouth every 6 (six) hours as needed for mild pain (pain score 1-3) or moderate pain (pain score 4-6).     bictegravir-emtricitabine -tenofovir  AF (BIKTARVY ) 50-200-25 MG TABS tablet Take 1 tablet by mouth daily. 30 tablet 5   Blood Pressure Monitoring (BLOOD PRESSURE CUFF) MISC Use as directed to check blood pressure daily 1 each 0   ferrous sulfate  325 (65 FE) MG tablet Take 1 tablet (325 mg total) by mouth every other day. 30 tablet 3   MELATONIN PO Take 1 each by mouth at bedtime.  gummy     ondansetron  (ZOFRAN -ODT) 4 MG disintegrating tablet Take 1-2 tablets (4-8 mg total) by mouth every 8 (eight) hours as needed for nausea. 42 tablet 6   oxyCODONE -acetaminophen  (PERCOCET) 5-325 MG tablet Take 1 tablet by mouth every 6 (six) hours as needed for severe pain (pain score 7-10). 21 tablet 0   Prenatal MV & Min w/FA-DHA (PRENATAL GUMMIES PO) Take 2 each by mouth daily. gummies     sertraline  (ZOLOFT ) 50 MG tablet Take half a tablet daily x1 week then take 1 full tablet daily. (Patient taking differently: Take 25 mg by mouth daily.) 30 tablet 2   aspirin  81 MG chewable tablet Chew 2 tablets (162 mg total) by mouth daily. (Patient not taking: Reported on 04/01/2024) 60 tablet 7   cyclobenzaprine  (FLEXERIL ) 10 MG tablet Take 1 tablet (10  mg total) by mouth 3 (three) times daily as needed for muscle spasms. Can caused drowsiness! (Patient not taking: Reported on 04/01/2024) 15 tablet 0   metoCLOPramide (REGLAN) 10 MG tablet Take 1 tablet (10 mg total) by mouth 3 (three) times daily with meals. (Patient not taking: Reported on 04/01/2024) 90 tablet 1   polyethylene glycol powder (MIRALAX ) 17 GM/SCOOP powder Take 17 g by mouth daily. Dissolve 1 capful (17g) in 4-8 ounces of liquid and take by mouth daily. (Patient not taking: Reported on 04/01/2024) 238 g 4   promethazine  (PHENERGAN ) 25 MG tablet Take 0.5-1 tablets (12.5-25 mg total) by mouth every 6 (six) hours as needed. (Patient not taking: Reported on 04/01/2024) 30 tablet 6   senna-docusate (SENOKOT-S) 8.6-50 MG tablet Take 2 tablets by mouth daily. (Patient not taking: Reported on 04/01/2024) 60 tablet 0   Vaginal Lubricant (REPHRESH ODOR ELIMINATING) GEL Place 1 Application vaginally 2 (two) times a week. (Patient not taking: Reported on 04/01/2024) 2 g 8   [DISCONTINUED] norelgestromin -ethinyl estradiol  (ORTHO EVRA) 150-35 MCG/24HR transdermal patch Place 1 patch onto the skin once a week. 3 patch 12   No current facility-administered medications on file prior to visit.    [Allergies]  [Allergies] No Known Allergies   Social History   Substance and Sexual Activity  Alcohol Use No    [Tobacco Use History]  [Tobacco Use History] Tobacco Use  Smoking Status Former   Types: Cigars, E-cigarettes  Smokeless Tobacco Never  Tobacco Comments   vapes    Review of Systems  Constitutional: Negative.   HENT: Negative.    Eyes: Negative.   Respiratory: Negative.    Cardiovascular: Negative.   Gastrointestinal:  Positive for abdominal pain, nausea and vomiting.  Genitourinary: Negative.   Musculoskeletal:  Positive for back pain.  Skin: Negative.   Neurological: Negative.   Endo/Heme/Allergies: Negative.   Psychiatric/Behavioral: Negative.      Objective    Vitals:   04/01/24 0905  BP: 108/76  Pulse: 91  Resp: 14  Temp: 98.4 F (36.9 C)  SpO2: 95%    Physical Exam Vitals reviewed.  Constitutional:      Appearance: Normal appearance. She is not ill-appearing.  HENT:     Head: Normocephalic and atraumatic.  Eyes:     General: No scleral icterus. Cardiovascular:     Rate and Rhythm: Normal rate and regular rhythm.     Heart sounds: Normal heart sounds. No murmur heard.    No friction rub. No gallop.  Pulmonary:     Effort: Pulmonary effort is normal. No respiratory distress.     Breath sounds: Normal breath sounds. No stridor. No  wheezing, rhonchi or rales.  Abdominal:     General: There is no distension.     Palpations: Abdomen is soft. There is no mass.     Tenderness: There is no abdominal tenderness. There is no guarding.     Hernia: No hernia is present.     Comments: Due to body habitus, it was difficult to appreciate fundal height, but it appears to be below the umbilicus.  Skin:    General: Skin is warm and dry.  Neurological:     Mental Status: She is alert and oriented to person, place, and time.   Recent OB/GYN notes reviewed  Assessment  Biliary colic secondary to cholelithiasis, pregnant with twins at 17 weeks. Plan  Patient is scheduled for a robotic assisted laparoscopic cholecystectomy on 04/06/2024.  The risks and benefits of the procedure including bleeding, infection, hepatobiliary injury, uterine injury, the possibility of an open procedure, and the possibility of miscarriage were fully explained to the patient, who gave informed consent.  She is fully aware of these risks and wants to proceed with surgery.

## 2024-04-03 ENCOUNTER — Other Ambulatory Visit: Payer: Self-pay

## 2024-04-03 ENCOUNTER — Encounter (HOSPITAL_COMMUNITY)
Admission: RE | Admit: 2024-04-03 | Discharge: 2024-04-03 | Disposition: A | Discharge status: Home / Self Care | Payer: MEDICAID | Source: Ambulatory Visit | Attending: General Surgery | Admitting: General Surgery

## 2024-04-03 ENCOUNTER — Encounter (HOSPITAL_COMMUNITY): Payer: Self-pay

## 2024-04-03 ENCOUNTER — Ambulatory Visit: Payer: Self-pay | Admitting: Advanced Practice Midwife

## 2024-04-03 NOTE — Patient Instructions (Signed)
 "         Alyssa Sandoval  04/03/2024     @PREFPERIOPPHARMACY @   Your procedure is scheduled on 04/06/2024.   Report to [facility] at  0600 A.M.   Call this number if you have problems the morning of surgery:  [phone]  If you experience any cold or flu symptoms such as cough, fever, chills, shortness of breath, etc. between now and your scheduled surgery, please notify us  at the above number.   Remember:  Do not eat after midnight.   You may drink clear liquids until  0330 am on 12/29/225.          Clear liquids allowed are:                    Water, Carbonated beverages (diabetics please choose diet or no sugar options), Black Coffee Only (No creamer, milk or cream, including half & half and powdered creamer), and Clear Sports drink (No red color; diabetics please choose diet or no sugar options)    Take these medicines the morning of surgery with A SIP OF WATER                     biktarvy , reglan, sertraline , (oxycoone and zofran -if needed).    Do not wear jewelry, make-up or nail polish, including gel polish,  artificial nails, or any other type of covering on natural nails (fingers and  toes).  Do not wear lotions, powders, or perfumes, or deodorant.  Do not shave 48 hours prior to surgery.  Men may shave face and neck.  Do not bring valuables to the hospital.  [facility] is not responsible for any belongings or valuables.  Contacts, dentures or bridgework may not be worn into surgery.  Leave your suitcase in the car.  After surgery it may be brought to your room.  For patients admitted to the hospital, discharge time will be determined by your treatment team.  Patients discharged the day of surgery will not be allowed to drive home and must have someone with them for 24 hours.    Special instructions:  DO NOT smoke tobacco or vape for 24 hours before your procedure.    Please read over the following fact sheets that you were given. Coughing and Deep  Breathing, Anesthesia Post-op Instructions, and Care and Recovery After Surgery      Minimally Invasive Cholecystectomy, Care After The following information offers guidance on how to care for yourself after your procedure. Your health care provider may also give you more specific instructions. If you have problems or questions, contact your health care provider. What can I expect after the procedure? After the procedure, it is common to have: Pain at your incision sites. You will be given medicines to control this pain. Mild nausea or vomiting. Bloating and possible shoulder pain from the gas that was used during the procedure. Follow these instructions at home: Medicines Take over-the-counter and prescription medicines only as told by your health care provider. If you were prescribed an antibiotic medicine, take it as told by your health care provider. Do not stop using the antibiotic even if you start to feel better. Ask your health care provider if the medicine prescribed to you: Requires you to avoid driving or using machinery. Can cause constipation. You may need to take these actions to prevent or treat constipation: Drink enough fluid to keep your urine pale yellow. Take over-the-counter or prescription medicines. Eat foods that are  high in fiber, such as beans, whole grains, and fresh fruits and vegetables. Limit foods that are high in fat and processed sugars, such as fried or sweet foods. Incision care  Follow instructions from your health care provider about how to take care of your incisions. Make sure you: Wash your hands with soap and water  for at least 20 seconds before and after you change your bandage (dressing). If soap and water  are not available, use hand sanitizer. Change your dressing as told by your health care provider. Leave stitches (sutures), skin glue, or adhesive strips in place. These skin closures may need to be in place for 2 weeks or longer. If adhesive  strip edges start to loosen and curl up, you may trim the loose edges. Do not remove adhesive strips completely unless your health care provider tells you to do that. Do not take baths, swim, or use a hot tub until your health care provider approves. Ask your health care provider if you may take showers. You may only be allowed to take sponge baths. Check your incision area every day for signs of infection. Check for: More redness, swelling, or pain. Fluid or blood. Warmth. Pus or a bad smell. Activity Rest as told by your health care provider. Do not do activities that require a lot of effort. Avoid sitting for a long time without moving. Get up to take short walks every 1-2 hours. This is important to improve blood flow and breathing. Ask for help if you feel weak or unsteady. Do not lift anything that is heavier than 10 lb (4.5 kg), or the limit that you are told, until your health care provider says that it is safe. Do not play contact sports until your health care provider approves. Do not return to work or school until your health care provider approves. Return to your normal activities as told by your health care provider. Ask your health care provider what activities are safe for you. General instructions If you were given a sedative during the procedure, it can affect you for several hours. Do not drive or operate machinery until your health care provider says that it is safe. Keep all follow-up visits. This is important. Contact a health care provider if: You develop a rash. You have more redness, swelling, or pain around your incisions. You have fluid or blood coming from your incisions. Your incisions feel warm to the touch. You have pus or a bad smell coming from your incisions. You have a fever. One or more of your incisions breaks open. Get help right away if: You have trouble breathing. You have chest pain. You have more pain in your shoulders. You faint or feel dizzy  when you stand. You have severe pain in your abdomen. You have nausea or vomiting that lasts for more than one day. You have leg pain that is new or unusual, or if it is localized to one specific spot. These symptoms may represent a serious problem that is an emergency. Do not wait to see if the symptoms will go away. Get medical help right away. Call your local emergency services (911 in the [place] Do not drive yourself to the hospital. Summary After your procedure, it is common to have pain at the incision sites. You may also have nausea or bloating. Follow your health care provider's instructions about medicine, activity restrictions, and caring for your incision areas. Do not do activities that require a lot of effort. Contact a health care provider if  you have a fever or other signs of infection, such as more redness, swelling, or pain around the incisions. Get help right away if you have chest pain, increasing pain in the shoulders, or trouble breathing. This information is not intended to replace advice given to you by your health care provider. Make sure you discuss any questions you have with your health care provider. Document Revised: 09/26/2020 Document Reviewed: 09/27/2020 Elsevier Patient Education  2024 [employer].General Anesthesia, Adult, Care After The following information offers guidance on how to care for yourself after your procedure. Your health care provider may also give you more specific instructions. If you have problems or questions, contact your health care provider. What can I expect after the procedure? After the procedure, it is common for people to: Have pain or discomfort at the IV site. Have nausea or vomiting. Have a sore throat or hoarseness. Have trouble concentrating. Feel cold or chills. Feel weak, sleepy, or tired (fatigue). Have soreness and body aches. These can affect parts of the body that were not involved in surgery. Follow these instructions  at home: For the time period you were told by your health care provider:  Rest. Do not participate in activities where you could fall or become injured. Do not drive or use machinery. Do not drink alcohol. Do not take sleeping pills or medicines that cause drowsiness. Do not make important decisions or sign legal documents. Do not take care of children on your own. General instructions Drink enough fluid to keep your urine pale yellow. If you have sleep apnea, surgery and certain medicines can increase your risk for breathing problems. Follow instructions from your health care provider about wearing your sleep device: Anytime you are sleeping, including during daytime naps. While taking prescription pain medicines, sleeping medicines, or medicines that make you drowsy. Return to your normal activities as told by your health care provider. Ask your health care provider what activities are safe for you. Take over-the-counter and prescription medicines only as told by your health care provider. Do not use any products that contain nicotine or tobacco. These products include cigarettes, chewing tobacco, and vaping devices, such as e-cigarettes. These can delay incision healing after surgery. If you need help quitting, ask your health care provider. Contact a health care provider if: You have nausea or vomiting that does not get better with medicine. You vomit every time you eat or drink. You have pain that does not get better with medicine. You cannot urinate or have bloody urine. You develop a skin rash. You have a fever. Get help right away if: You have trouble breathing. You have chest pain. You vomit blood. These symptoms may be an emergency. Get help right away. Call 911. Do not wait to see if the symptoms will go away. Do not drive yourself to the hospital. Summary After the procedure, it is common to have a sore throat, hoarseness, nausea, vomiting, or to feel weak, sleepy, or  fatigue. For the time period you were told by your health care provider, do not drive or use machinery. Get help right away if you have difficulty breathing, have chest pain, or vomit blood. These symptoms may be an emergency. This information is not intended to replace advice given to you by your health care provider. Make sure you discuss any questions you have with your health care provider. Document Revised: 06/23/2021 Document Reviewed: 06/23/2021 Elsevier Patient Education  2024 Else[employer] Inc.How to Use Chlorhexidine  at Home in the Shower Chlorhexidine  gluconate (  CHG) is a germ-killing (antiseptic) wash that's used to clean the skin. It can get rid of the germs that normally live on the skin and can keep them away for about 24 hours. If you're having surgery, you may be told to shower with CHG at home the night before surgery. This can help lower your risk for infection. To use CHG wash in the shower, follow the steps below. Supplies needed: CHG body wash. Clean washcloth. Clean towel. How to use CHG in the shower Follow these steps unless you're told to use CHG in a different way: Start the shower. Use your normal soap and shampoo to wash your face and hair. Turn off the shower or move out of the shower stream. Pour CHG onto a clean washcloth. Do not use any type of brush or rough sponge. Start at your neck, washing your body down to your toes. Make sure you: Wash the part of your body where the surgery will be done for at least 1 minute. Do not scrub. Do not use CHG on your head or face unless your health care provider tells you to. If it gets into your ears or eyes, rinse them well with water. Do not wash your genitals with CHG. Wash your back and under your arms. Make sure to wash skin folds. Let the CHG sit on your skin for 1-2 minutes or as long as told. Rinse your entire body in the shower, including all body creases and folds. Turn off the shower. Dry off with a clean  towel. Do not put anything on your skin afterward, such as powder, lotion, or perfume. Put on clean clothes or pajamas. If it's the night before surgery, sleep in clean sheets. General tips Use CHG only as told, and follow the instructions on the label. Use the full amount of CHG as told. This is often one bottle. Do not smoke and stay away from flames after using CHG. Your skin may feel sticky after using CHG. This is normal. The sticky feeling will go away as the CHG dries. Do not use CHG: If you have a chlorhexidine allergy or have reacted to chlorhexidine in the past. On open wounds or areas of skin that have broken skin, cuts, or scrapes. On babies younger than 85 months of age. Contact a health care provider if: You have questions about using CHG. Your skin gets irritated or itchy. You have a rash after using CHG. You swallow any CHG. Call your local poison control center [phone] in the [place] Your eyes itch badly, or they become very red or swollen. Your hearing changes. You have trouble seeing. If you can't reach your provider, go to an urgent care or emergency room. Do not drive yourself. Get help right away if: You have swelling or tingling in your mouth or throat. You make high-pitched whistling sounds when you breathe, most often when you breathe out (wheeze). You have trouble breathing. These symptoms may be an emergency. Call 911 right away. Do not wait to see if the symptoms will go away. Do not drive yourself to the hospital. This information is not intended to replace advice given to you by your health care provider. Make sure you discuss any questions you have with your health care provider. Document Revised: 10/09/2022 Document Reviewed: 10/05/2021 Elsevier Patient Education  2024 [employer]. "

## 2024-04-06 ENCOUNTER — Encounter (HOSPITAL_COMMUNITY): Payer: Self-pay | Admitting: General Surgery

## 2024-04-06 ENCOUNTER — Encounter (HOSPITAL_COMMUNITY)
Admission: RE | Disposition: A | Discharge status: Home / Self Care | Payer: Self-pay | Source: Home / Self Care | Attending: General Surgery

## 2024-04-06 ENCOUNTER — Ambulatory Visit (HOSPITAL_COMMUNITY): Payer: MEDICAID | Admitting: Certified Registered Nurse Anesthetist

## 2024-04-06 ENCOUNTER — Ambulatory Visit (HOSPITAL_COMMUNITY)
Admission: RE | Admit: 2024-04-06 | Discharge: 2024-04-06 | Disposition: A | Payer: MEDICAID | Attending: General Surgery | Admitting: General Surgery

## 2024-04-06 DIAGNOSIS — Z3A18 18 weeks gestation of pregnancy: Secondary | ICD-10-CM | POA: Insufficient documentation

## 2024-04-06 DIAGNOSIS — K8070 Calculus of gallbladder and bile duct without cholecystitis without obstruction: Secondary | ICD-10-CM

## 2024-04-06 DIAGNOSIS — K219 Gastro-esophageal reflux disease without esophagitis: Secondary | ICD-10-CM | POA: Diagnosis not present

## 2024-04-06 DIAGNOSIS — O98712 Human immunodeficiency virus [HIV] disease complicating pregnancy, second trimester: Secondary | ICD-10-CM | POA: Diagnosis not present

## 2024-04-06 DIAGNOSIS — Z79624 Long term (current) use of inhibitors of nucleotide synthesis: Secondary | ICD-10-CM | POA: Diagnosis not present

## 2024-04-06 DIAGNOSIS — D649 Anemia, unspecified: Secondary | ICD-10-CM | POA: Insufficient documentation

## 2024-04-06 DIAGNOSIS — Z7982 Long term (current) use of aspirin: Secondary | ICD-10-CM | POA: Insufficient documentation

## 2024-04-06 DIAGNOSIS — O24112 Pre-existing diabetes mellitus, type 2, in pregnancy, second trimester: Secondary | ICD-10-CM | POA: Insufficient documentation

## 2024-04-06 DIAGNOSIS — K8020 Calculus of gallbladder without cholecystitis without obstruction: Secondary | ICD-10-CM | POA: Diagnosis present

## 2024-04-06 DIAGNOSIS — O26892 Other specified pregnancy related conditions, second trimester: Secondary | ICD-10-CM | POA: Insufficient documentation

## 2024-04-06 DIAGNOSIS — O09292 Supervision of pregnancy with other poor reproductive or obstetric history, second trimester: Secondary | ICD-10-CM | POA: Diagnosis not present

## 2024-04-06 DIAGNOSIS — O26612 Liver and biliary tract disorders in pregnancy, second trimester: Secondary | ICD-10-CM | POA: Diagnosis not present

## 2024-04-06 DIAGNOSIS — O30002 Twin pregnancy, unspecified number of placenta and unspecified number of amniotic sacs, second trimester: Secondary | ICD-10-CM | POA: Diagnosis not present

## 2024-04-06 DIAGNOSIS — Z21 Asymptomatic human immunodeficiency virus [HIV] infection status: Secondary | ICD-10-CM | POA: Insufficient documentation

## 2024-04-06 DIAGNOSIS — I10 Essential (primary) hypertension: Secondary | ICD-10-CM | POA: Insufficient documentation

## 2024-04-06 DIAGNOSIS — F32A Depression, unspecified: Secondary | ICD-10-CM | POA: Insufficient documentation

## 2024-04-06 DIAGNOSIS — F17290 Nicotine dependence, other tobacco product, uncomplicated: Secondary | ICD-10-CM | POA: Insufficient documentation

## 2024-04-06 DIAGNOSIS — O24912 Unspecified diabetes mellitus in pregnancy, second trimester: Secondary | ICD-10-CM | POA: Diagnosis not present

## 2024-04-06 DIAGNOSIS — R519 Headache, unspecified: Secondary | ICD-10-CM | POA: Diagnosis not present

## 2024-04-06 DIAGNOSIS — Z833 Family history of diabetes mellitus: Secondary | ICD-10-CM | POA: Diagnosis not present

## 2024-04-06 DIAGNOSIS — O99612 Diseases of the digestive system complicating pregnancy, second trimester: Secondary | ICD-10-CM | POA: Insufficient documentation

## 2024-04-06 DIAGNOSIS — J00 Acute nasopharyngitis [common cold]: Secondary | ICD-10-CM | POA: Insufficient documentation

## 2024-04-06 DIAGNOSIS — O99332 Smoking (tobacco) complicating pregnancy, second trimester: Secondary | ICD-10-CM | POA: Diagnosis not present

## 2024-04-06 DIAGNOSIS — K8010 Calculus of gallbladder with chronic cholecystitis without obstruction: Secondary | ICD-10-CM | POA: Insufficient documentation

## 2024-04-06 DIAGNOSIS — Z79899 Other long term (current) drug therapy: Secondary | ICD-10-CM | POA: Diagnosis not present

## 2024-04-06 LAB — GLUCOSE, CAPILLARY
Glucose-Capillary: 92 mg/dL (ref 70–99)
Glucose-Capillary: 128 mg/dL — ABNORMAL HIGH (ref 70–99)

## 2024-04-06 SURGERY — CHOLECYSTECTOMY, ROBOT-ASSISTED, LAPAROSCOPIC
Anesthesia: General | Site: Abdomen

## 2024-04-06 MED ORDER — LIDOCAINE 2% (20 MG/ML) 5 ML SYRINGE
INTRAMUSCULAR | Status: DC | PRN
  Administered 2024-04-06: 60 mg via INTRAVENOUS

## 2024-04-06 MED ORDER — SUCCINYLCHOLINE CHLORIDE 200 MG/10ML IV SOSY
PREFILLED_SYRINGE | INTRAVENOUS | Status: DC | PRN
  Administered 2024-04-06: 140 mg via INTRAVENOUS

## 2024-04-06 MED ORDER — LACTATED RINGERS IV SOLN: INTRAVENOUS | Status: DC | PRN

## 2024-04-06 MED ORDER — CEFAZOLIN SODIUM-DEXTROSE 2-4 GM/100ML-% IV SOLN
2.0000 g | INTRAVENOUS | Status: AC
  Administered 2024-04-06: 2 g via INTRAVENOUS
  Filled 2024-04-06: qty 100

## 2024-04-06 MED ORDER — SUCCINYLCHOLINE CHLORIDE 200 MG/10ML IV SOSY
PREFILLED_SYRINGE | INTRAVENOUS | Status: AC
  Filled 2024-04-06: qty 10

## 2024-04-06 MED ORDER — STERILE WATER FOR IRRIGATION IR SOLN
Status: DC | PRN
  Administered 2024-04-06: 1000 mL

## 2024-04-06 MED ORDER — BUPIVACAINE HCL (PF) 0.5 % IJ SOLN
INTRAMUSCULAR | Status: AC
  Filled 2024-04-06: qty 30

## 2024-04-06 MED ORDER — LACTATED RINGERS IV SOLN: INTRAVENOUS | Status: DC

## 2024-04-06 MED ORDER — PROPOFOL 500 MG/50ML IV EMUL
INTRAVENOUS | Status: AC
  Filled 2024-04-06: qty 50

## 2024-04-06 MED ORDER — CHLORHEXIDINE GLUCONATE 0.12 % MT SOLN
15.0000 mL | Freq: Once | OROMUCOSAL | Status: AC
  Administered 2024-04-06: 15 mL via OROMUCOSAL

## 2024-04-06 MED ORDER — MIDAZOLAM HCL 2 MG/2ML IJ SOLN
INTRAMUSCULAR | Status: AC
  Filled 2024-04-06: qty 2

## 2024-04-06 MED ORDER — CHLORHEXIDINE GLUCONATE CLOTH 2 % EX PADS: 6.0000 | MEDICATED_PAD | Freq: Once | CUTANEOUS | Status: DC

## 2024-04-06 MED ORDER — FENTANYL CITRATE (PF) 100 MCG/2ML IJ SOLN
INTRAMUSCULAR | Status: DC | PRN
  Administered 2024-04-06: 50 ug via INTRAVENOUS
  Administered 2024-04-06: 100 ug via INTRAVENOUS
  Administered 2024-04-06 (×2): 50 ug via INTRAVENOUS

## 2024-04-06 MED ORDER — PHENYLEPHRINE 80 MCG/ML (10ML) SYRINGE FOR IV PUSH (FOR BLOOD PRESSURE SUPPORT)
PREFILLED_SYRINGE | INTRAVENOUS | Status: DC | PRN
  Administered 2024-04-06: 40 ug via INTRAVENOUS
  Administered 2024-04-06: 80 ug via INTRAVENOUS

## 2024-04-06 MED ORDER — CHLORHEXIDINE GLUCONATE CLOTH 2 % EX PADS
6.0000 | MEDICATED_PAD | Freq: Once | CUTANEOUS | Status: AC
  Administered 2024-04-06: 6 via TOPICAL

## 2024-04-06 MED ORDER — ROCURONIUM BROMIDE 10 MG/ML (PF) SYRINGE
PREFILLED_SYRINGE | INTRAVENOUS | Status: DC | PRN
  Administered 2024-04-06: 40 mg via INTRAVENOUS

## 2024-04-06 MED ORDER — ONDANSETRON HCL 4 MG/2ML IJ SOLN
INTRAMUSCULAR | Status: DC | PRN
  Administered 2024-04-06: 4 mg via INTRAVENOUS

## 2024-04-06 MED ORDER — ORAL CARE MOUTH RINSE: 15.0000 mL | Freq: Once | OROMUCOSAL | Status: AC

## 2024-04-06 MED ORDER — ATROPINE SULFATE (PF) 0.4 MG/ML IJ SOLN
INTRAMUSCULAR | Status: AC
  Filled 2024-04-06: qty 1

## 2024-04-06 MED ORDER — ATROPINE SULFATE 0.4 MG/ML IV SOLN
INTRAVENOUS | Status: DC | PRN
  Administered 2024-04-06: .4 mg via INTRAVENOUS

## 2024-04-06 MED ORDER — OXYCODONE-ACETAMINOPHEN 5-325 MG PO TABS: 1.0000 | ORAL_TABLET | ORAL | 0 refills | Status: DC | PRN

## 2024-04-06 MED ORDER — SCOPOLAMINE 1 MG/3DAYS TD PT72
MEDICATED_PATCH | TRANSDERMAL | Status: AC
  Administered 2024-04-06: 1 mg via TRANSDERMAL
  Filled 2024-04-06: qty 1

## 2024-04-06 MED ORDER — SCOPOLAMINE 1 MG/3DAYS TD PT72: 1.0000 | MEDICATED_PATCH | Freq: Once | TRANSDERMAL | Status: DC

## 2024-04-06 MED ORDER — HYDROMORPHONE HCL 1 MG/ML IJ SOLN
0.2500 mg | INTRAMUSCULAR | Status: DC | PRN
  Administered 2024-04-06 (×2): 0.5 mg via INTRAVENOUS
  Filled 2024-04-06: qty 0.5

## 2024-04-06 MED ORDER — DEXAMETHASONE SODIUM PHOSPHATE 4 MG/ML IJ SOLN
8.0000 mg | Freq: Once | INTRAMUSCULAR | Status: AC | PRN
  Administered 2024-04-06: 8 mg via INTRAVENOUS
  Filled 2024-04-06: qty 2

## 2024-04-06 MED ORDER — INDOCYANINE GREEN 25 MG IJ SOLR
INTRAMUSCULAR | Status: AC
  Filled 2024-04-06: qty 10

## 2024-04-06 MED ORDER — NEOSTIGMINE METHYLSULFATE 3 MG/3ML IV SOSY
PREFILLED_SYRINGE | INTRAVENOUS | Status: AC
  Filled 2024-04-06: qty 3

## 2024-04-06 MED ORDER — NEOSTIGMINE METHYLSULFATE 3 MG/3ML IV SOSY
PREFILLED_SYRINGE | INTRAVENOUS | Status: DC | PRN
  Administered 2024-04-06: 4 mg via INTRAVENOUS

## 2024-04-06 MED ORDER — FENTANYL CITRATE (PF) 250 MCG/5ML IJ SOLN
INTRAMUSCULAR | Status: AC
  Filled 2024-04-06: qty 5

## 2024-04-06 MED ORDER — ROCURONIUM BROMIDE 10 MG/ML (PF) SYRINGE
PREFILLED_SYRINGE | INTRAVENOUS | Status: AC
  Filled 2024-04-06: qty 10

## 2024-04-06 MED ORDER — OXYCODONE HCL 5 MG PO TABS
5.0000 mg | ORAL_TABLET | Freq: Once | ORAL | Status: AC | PRN
  Administered 2024-04-06: 5 mg via ORAL
  Filled 2024-04-06: qty 1

## 2024-04-06 MED ORDER — BUPIVACAINE HCL (PF) 0.5 % IJ SOLN
INTRAMUSCULAR | Status: DC | PRN
  Administered 2024-04-06: 23 mL

## 2024-04-06 MED ORDER — LIDOCAINE 2% (20 MG/ML) 5 ML SYRINGE
INTRAMUSCULAR | Status: AC
  Filled 2024-04-06: qty 5

## 2024-04-06 MED ORDER — PROPOFOL 10 MG/ML IV BOLUS
INTRAVENOUS | Status: DC | PRN
  Administered 2024-04-06: 50 ug/kg/min via INTRAVENOUS
  Administered 2024-04-06: 180 mg via INTRAVENOUS

## 2024-04-06 MED ORDER — PROPOFOL 10 MG/ML IV BOLUS
INTRAVENOUS | Status: AC
  Filled 2024-04-06: qty 20

## 2024-04-06 MED ORDER — ONDANSETRON HCL 4 MG/2ML IJ SOLN
INTRAMUSCULAR | Status: AC
  Filled 2024-04-06: qty 2

## 2024-04-06 MED ORDER — OXYCODONE HCL 5 MG/5ML PO SOLN: 5.0000 mg | Freq: Once | ORAL | Status: AC | PRN

## 2024-04-06 MED ORDER — HYDROMORPHONE HCL 1 MG/ML IJ SOLN
INTRAMUSCULAR | Status: AC
  Filled 2024-04-06: qty 0.5

## 2024-04-06 SURGICAL SUPPLY — 33 items
APPLICATOR CHLORAPREP 10.5 ORG (MISCELLANEOUS) IMPLANT
CAUTERY HOOK MNPLR 1.6 DVNC XI (INSTRUMENTS) ×1 IMPLANT
CHLORAPREP W/TINT 26 (MISCELLANEOUS) ×1 IMPLANT
CLIP LIGATING HEM O LOK PURPLE (MISCELLANEOUS) ×1 IMPLANT
COVER LIGHT HANDLE (MISCELLANEOUS) IMPLANT
DERMABOND ADVANCED .7 DNX12 (GAUZE/BANDAGES/DRESSINGS) ×1 IMPLANT
DRAPE ARM DVNC X/XI (DISPOSABLE) ×4 IMPLANT
DRAPE COLUMN DVNC XI (DISPOSABLE) ×1 IMPLANT
ELECTRODE REM PT RTRN 9FT ADLT (ELECTROSURGICAL) ×1 IMPLANT
FORCEPS BPLR R/ABLATION 8 DVNC (INSTRUMENTS) ×1 IMPLANT
FORCEPS PROGRASP DVNC XI (FORCEP) ×1 IMPLANT
GLOVE BIOGEL PI IND STRL 7.0 (GLOVE) ×2 IMPLANT
GLOVE SURG SS PI 7.5 STRL IVOR (GLOVE) ×2 IMPLANT
GOWN STRL REUS W/TWL LRG LVL3 (GOWN DISPOSABLE) ×3 IMPLANT
IRRIGATOR SUCT 8 DISP DVNC XI (IRRIGATION / IRRIGATOR) IMPLANT
KIT TURNOVER KIT A (KITS) ×1 IMPLANT
MANIFOLD NEPTUNE II (INSTRUMENTS) ×1 IMPLANT
NEEDLE HYPO 21X1.5 SAFETY (NEEDLE) ×1 IMPLANT
NEEDLE INSUFFLATION 14GA 120MM (NEEDLE) ×1 IMPLANT
OBTURATOR OPTICALSTD 8 DVNC (TROCAR) ×1 IMPLANT
PACK LAP CHOLE LZT030E (CUSTOM PROCEDURE TRAY) ×1 IMPLANT
PAD ARMBOARD POSITIONER FOAM (MISCELLANEOUS) ×1 IMPLANT
PENCIL HANDSWITCHING (ELECTRODE) ×1 IMPLANT
POSITIONER HEAD 8X9X4 ADT (SOFTGOODS) ×1 IMPLANT
SEAL UNIV 5-12 XI (MISCELLANEOUS) ×4 IMPLANT
SET BASIN LINEN APH (SET/KITS/TRAYS/PACK) ×1 IMPLANT
SET TUBE SMOKE EVAC HIGH FLOW (TUBING) IMPLANT
SOL .9 NS 3000ML IRR UROMATIC (IV SOLUTION) IMPLANT
SUT MNCRL AB 4-0 PS2 18 (SUTURE) ×2 IMPLANT
SUT VICRYL 0 UR6 27IN ABS (SUTURE) IMPLANT
SYR 30ML LL (SYRINGE) ×1 IMPLANT
SYSTEM RETRIEVL 5MM INZII UNIV (BASKET) ×1 IMPLANT
WATER STERILE IRR 500ML POUR (IV SOLUTION) ×1 IMPLANT

## 2024-04-06 NOTE — Transfer of Care (Signed)
 Immediate Anesthesia Transfer of Care Note  Patient: Alyssa Sandoval  Procedure(s) Performed: CHOLECYSTECTOMY, ROBOT-ASSISTED, LAPAROSCOPIC (Abdomen)  Patient Location: PACU  Anesthesia Type:General  Level of Consciousness: awake  Airway & Oxygen Therapy: Patient Spontanous Breathing and Patient connected to nasal cannula oxygen  Post-op Assessment: Report given to RN and Post -op Vital signs reviewed and stable  Post vital signs: Reviewed and stable  Last Vitals:  Vitals Value Taken Time  BP 115/79   Temp 97.7   Pulse 64 04/06/24 08:44  Resp 19 04/06/24 08:44  SpO2 99 % 04/06/24 08:44  Vitals shown include unfiled device data.  Last Pain:  Vitals:   04/06/24 0658  PainSc: 7       Patients Stated Pain Goal: 6 (04/06/24 9341)  Complications: No notable events documented.

## 2024-04-06 NOTE — Op Note (Signed)
 Patient:  Alyssa Sandoval  DOB:  2000-04-24  MRN:  [id number]   Preop Diagnosis: Biliary colic, cholelithiasis, intrauterine pregnancy with twins  Postop Diagnosis: Same  Procedure: Robotic assisted laparoscopic cholecystectomy  Surgeon: [name], MD  Anes: General Endotracheal  Indications: Patient is a 23 year old black female with an intrauterine pregnancy with twins at 21 weeks who presents with biliary colic secondary to cholelithiasis.  The risks and benefits of the procedure including bleeding, infection, hepatobiliary injury, the possibility of an open procedure, and the possibility of miscarriage were fully explained to the patient, who gave informed consent.  Procedure note: The patient was placed in the supine position.  After induction of general endotracheal anesthesia, the abdomen was prepped and draped using the usual sterile technique with ChloraPrep.  Surgical site confirmation was performed.  A supraumbilical incision was made down to the fascia.  A Veress needle was gently introduced into the abdominal cavity under direct visualization.  Confirmation of placement was done using the saline drop test.  The abdomen was then insufflated to 15 mmHg pressure.  An 8 mm trocar was introduced into the abdominal cavity under direct visualization without difficulty.  The patient was placed in reverse Trendelenburg position and additional 8 mm trocars were placed in the left upper quadrant, right lower quadrant, and right flank regions.  The gravid uterus was inspected and no evidence of injury was noted.  The robot was then docked and targeted.  The liver appeared to be within normal limits.  The gallbladder was retracted in a dynamic fashion in order to provide a critical view of the triangle of Calot.  The cystic duct was first identified.  Its juncture to the infundibulum was fully identified.  Hem-o-lok clips were placed proximally and distally on the cystic duct and the cystic  duct was divided.  This was likewise done in the cystic artery.  Firefly was not used as the patient is pregnant.  The gallbladder was freed away from the gallbladder fossa using Bovie electrocautery.  The gallbladder was delivered through the left upper quadrant trocar site using an Endo Catch bag without difficulty.  The uterus was again inspected and no injury was noted.  The robot was undocked and all air was evacuated from the abdominal cavity prior to removal of the trocars.  All wounds were irrigated with normal saline.  All wounds were injected with 0.5% Sensorcaine .  The supraumbilical fascia was reapproximated using an 0 Vicryl interrupted suture.  All skin incisions were closed using a 4-0 Monocryl subcuticular suture.  Dermabond was applied.  All tape and needle counts were correct at the end of the procedure.  The patient was extubated in the operating room and transferred to PACU in stable condition.  While in PACU, fetal heart tones of 163 and 140 were noted.  Complications: None  EBL: Minimal  Specimen: Gallbladder

## 2024-04-06 NOTE — Progress Notes (Signed)
 Fetal heart tones accessed in post-op. Baby A(left abd.): 127 Baby B(right abd.): 132

## 2024-04-06 NOTE — Progress Notes (Signed)
 Asked Dr. Mavis if he wanted IC-green to be given and he states No.

## 2024-04-06 NOTE — Anesthesia Preprocedure Evaluation (Addendum)
"                                    Anesthesia Evaluation  Patient identified by MRN, date of birth, ID band Patient awake    Reviewed: Allergy & Precautions, H&P , NPO status , Patient's Chart, lab work & pertinent test results  Airway Mallampati: II  TM Distance: >3 FB Neck ROM: Full    Dental no notable dental hx.    Pulmonary former smoker   Pulmonary exam normal breath sounds clear to auscultation       Cardiovascular hypertension, Normal cardiovascular exam Rhythm:Regular Rate:Normal     Neuro/Psych  Headaches PSYCHIATRIC DISORDERS  Depression       GI/Hepatic Neg liver ROS,GERD  ,,  Endo/Other  diabetes    Renal/GU Renal disease  negative genitourinary   Musculoskeletal negative musculoskeletal ROS (+)    Abdominal   Peds negative pediatric ROS (+)  Hematology  (+) Blood dyscrasia, anemia , HIV  Anesthesia Other Findings Patient is [redacted] weeks pregnant with twins She understands the risks of proceeding with the surgery HIV positive  Reproductive/Obstetrics (+) Pregnancy                              Anesthesia Physical Anesthesia Plan  ASA: 2  Anesthesia Plan: General   Post-op Pain Management:    Induction: Intravenous  PONV Risk Score and Plan: Scopolamine  patch - Pre-op  Airway Management Planned: Oral ETT  Additional Equipment:   Intra-op Plan:   Post-operative Plan: Extubation in OR  Informed Consent: I have reviewed the patients History and Physical, chart, labs and discussed the procedure including the risks, benefits and alternatives for the proposed anesthesia with the patient or authorized representative who has indicated his/her understanding and acceptance.     Dental advisory given  Plan Discussed with: CRNA  Anesthesia Plan Comments:          Anesthesia Quick Evaluation  "

## 2024-04-06 NOTE — Progress Notes (Addendum)
" °  April 06, 2024  Patient: Alyssa Sandoval  Date of Birth: 2000-08-31  Date of Visit: 03/31/2024    Date of Visit: 04/06/24  To Whom It May Concern: Alyssa Sandoval needs to be excused from work 12/29-12/30,2025 to assist Ms. Rennie at home due to a procedure she had.  Alyssa Sandoval was seen and treated in our short stay department on 04/06/24.   Sincerely,  Manuelita Galla, RN Short stay department, [facility]        "

## 2024-04-06 NOTE — Progress Notes (Signed)
 Work excuse for patients sig.other printed for him and given to him.  D/c instructions explained to patient, Roger(SO) and patients grandma.

## 2024-04-06 NOTE — Progress Notes (Signed)
 Patient arrived to the PACU, Dr. Mavis at the bedside and checked fetal heart tones. Baby A(left abd): 163 Baby B (right abd.): 140

## 2024-04-06 NOTE — Progress Notes (Signed)
 Fetal heart tones in preop- baby a:154. Baby b:166

## 2024-04-06 NOTE — Interval H&P Note (Signed)
 History and Physical Interval Note:  04/06/2024 7:07 AM  Alyssa Sandoval  has presented today for surgery, with the diagnosis of CHOLELITHIASIS.  The various methods of treatment have been discussed with the patient and family. After consideration of risks, benefits and other options for treatment, the patient has consented to  Procedures: CHOLECYSTECTOMY, ROBOT-ASSISTED, LAPAROSCOPIC (N/A) as a surgical intervention.  The patient's history has been reviewed, patient examined, no change in status, stable for surgery.  I have reviewed the patient's chart and labs.  Questions were answered to the patient's satisfaction.     [name]

## 2024-04-06 NOTE — Anesthesia Postprocedure Evaluation (Signed)
"   Anesthesia Post Note  Patient: Alyssa Sandoval  Procedure(s) Performed: CHOLECYSTECTOMY, ROBOT-ASSISTED, LAPAROSCOPIC (Abdomen)  Patient location during evaluation: PACU Anesthesia Type: General Level of consciousness: awake and alert Pain management: pain level controlled Vital Signs Assessment: post-procedure vital signs reviewed and stable Respiratory status: spontaneous breathing, nonlabored ventilation, respiratory function stable and patient connected to nasal cannula oxygen Cardiovascular status: blood pressure returned to baseline and stable Postop Assessment: no apparent nausea or vomiting Anesthetic complications: no Comments: Fetal heart tones documented post op   No notable events documented.   Last Vitals:  Vitals:   04/06/24 0911 04/06/24 0915  BP:    Pulse: 65 63  Resp: 12 13  Temp:    SpO2: 100% 100%    Last Pain:  Vitals:   04/06/24 0909  PainSc: 5                  Alyssa Sandoval      "

## 2024-04-06 NOTE — Anesthesia Procedure Notes (Signed)
 Procedure Name: Intubation Date/Time: 04/06/2024 7:36 AM  Performed by: Elaine Delon CROME, CRNAPre-anesthesia Checklist: Patient identified, Emergency Drugs available, Suction available and Patient being monitored Patient Re-evaluated:Patient Re-evaluated prior to induction Oxygen Delivery Method: Circle system utilized Preoxygenation: Pre-oxygenation with 100% oxygen Induction Type: IV induction, Cricoid Pressure applied and Rapid sequence Laryngoscope Size: Glidescope and 3 Grade View: Grade I Tube type: Oral Tube size: 7.0 mm Number of attempts: 1 Airway Equipment and Method: Rigid stylet and Video-laryngoscopy Placement Confirmation: positive ETCO2, breath sounds checked- equal and bilateral and ETT inserted through vocal cords under direct vision Secured at: 21 cm Tube secured with: Tape Dental Injury: Teeth and Oropharynx as per pre-operative assessment  Comments: Elective glidescope

## 2024-04-07 ENCOUNTER — Ambulatory Visit: Payer: MEDICAID | Admitting: Physical Therapy

## 2024-04-07 LAB — SURGICAL PATHOLOGY

## 2024-04-10 ENCOUNTER — Encounter: Payer: Self-pay | Admitting: Obstetrics & Gynecology

## 2024-04-11 HISTORY — PX: CHOLECYSTECTOMY: SHX55

## 2024-04-16 ENCOUNTER — Ambulatory Visit (INDEPENDENT_AMBULATORY_CARE_PROVIDER_SITE_OTHER): Payer: MEDICAID | Admitting: General Surgery

## 2024-04-16 ENCOUNTER — Encounter: Payer: Self-pay | Admitting: General Surgery

## 2024-04-16 DIAGNOSIS — Z09 Encounter for follow-up examination after completed treatment for conditions other than malignant neoplasm: Secondary | ICD-10-CM

## 2024-04-16 DIAGNOSIS — K8020 Calculus of gallbladder without cholecystitis without obstruction: Secondary | ICD-10-CM

## 2024-04-16 NOTE — Progress Notes (Signed)
 Subjective:     Alyssa Sandoval  Virtual postoperative telephone visit performed with patient.  I was in the office.  She states she is doing well.  Her nausea has decreased.  She definitely feels the surgery was helpful.  She has had no vaginal bleeding or lower abdominal cramping.  She has been constipated.  She is seeing her OB/GYN next week. Objective:    LMP  (LMP Unknown)   General:  alert, cooperative, and no distress  Final pathology consistent with diagnosis.     Assessment:    Doing well postoperatively. No evidence of miscarriage. Constipation.    Plan:   I told the patient to take MiraLAX  twice a day and to drink plenty of fluids.  She will be following up with her OB/GYN next week.  Follow-up here as needed.  Total telephone time was 1-1/2 minutes.  As this was a part of the total global surgical fee, this was not a billable visit.

## 2024-04-22 ENCOUNTER — Ambulatory Visit: Payer: MEDICAID | Admitting: Advanced Practice Midwife

## 2024-04-22 ENCOUNTER — Ambulatory Visit: Payer: MEDICAID

## 2024-04-22 VITALS — BP 111/76 | HR 88 | Wt 183.0 lb

## 2024-04-22 DIAGNOSIS — O322XX2 Maternal care for transverse and oblique lie, fetus 2: Secondary | ICD-10-CM | POA: Diagnosis not present

## 2024-04-22 DIAGNOSIS — O30002 Twin pregnancy, unspecified number of placenta and unspecified number of amniotic sacs, second trimester: Secondary | ICD-10-CM

## 2024-04-22 DIAGNOSIS — O0992 Supervision of high risk pregnancy, unspecified, second trimester: Secondary | ICD-10-CM

## 2024-04-22 DIAGNOSIS — O30042 Twin pregnancy, dichorionic/diamniotic, second trimester: Secondary | ICD-10-CM

## 2024-04-22 DIAGNOSIS — Z3A20 20 weeks gestation of pregnancy: Secondary | ICD-10-CM

## 2024-04-22 DIAGNOSIS — Z363 Encounter for antenatal screening for malformations: Secondary | ICD-10-CM

## 2024-04-22 DIAGNOSIS — O30009 Twin pregnancy, unspecified number of placenta and unspecified number of amniotic sacs, unspecified trimester: Secondary | ICD-10-CM

## 2024-04-22 NOTE — Progress Notes (Signed)
 "  HIGH-RISK PREGNANCY VISIT Patient name: Alyssa Sandoval MRN [id number]  Date of birth: July 26, 2000 Chief Complaint:   Routine Prenatal Visit (Anatomy scan)  History of Present Illness:   Alyssa Sandoval is a 24 y.o. [phone] female at [redacted]w[redacted]d with an Estimated Date of Delivery: 09/08/24 being seen today for ongoing management of a high-risk pregnancy complicated by multiple gestation DC/DA twins & HIV.  S/p lap chole on 04/06/24- doing well.  Today she reports no complaints. Contractions: Not present. Vag. Bleeding: None.  Movement: Present. denies leaking of fluid.      02/26/2024    3:32 PM 12/14/2022   11:10 AM 08/29/2022    3:16 PM 07/23/2022   10:06 AM 01/24/2022    9:51 AM  Depression screen PHQ 2/9  Decreased Interest 2 0 0 0 3  Down, Depressed, Hopeless 2 0 0 0 0  PHQ - 2 Score 4 0 0 0 3  Altered sleeping 3   3 3   Tired, decreased energy 3   3 3   Change in appetite 3   0 0  Feeling bad or failure about yourself  2   0 0  Trouble concentrating 0   0 0  Moving slowly or fidgety/restless 0   0 0  Suicidal thoughts 0   0 0  PHQ-9 Score 15   6  9       Data saved with a previous flowsheet row definition        02/26/2024    3:32 PM 07/23/2022   10:07 AM 01/24/2022    9:52 AM 08/09/2021    3:56 PM  GAD 7 : Generalized Anxiety Score  Nervous, Anxious, on Edge 1 1 0 1  Control/stop worrying 1 0 0 0  Worry too much - different things 3 0 0 1  Trouble relaxing 3 1 0 0  Restless 0 0 0 0  Easily annoyed or irritable 0 0 0 0  Afraid - awful might happen 0 0 0 0  Total GAD 7 Score 8 2 0 2     Review of Systems:   Pertinent items are noted in HPI Denies abnormal vaginal discharge w/ itching/odor/irritation, headaches, visual changes, shortness of breath, chest pain, abdominal pain, severe nausea/vomiting, or problems with urination or bowel movements unless otherwise stated above. Pertinent History Reviewed:  Reviewed past medical,surgical, social, obstetrical and family  history.  Reviewed problem list, medications and allergies. Physical Assessment:   Vitals:   04/22/24 1600  BP: 111/76  Pulse: 88  Weight: 183 lb (83 kg)  Body mass index is 35.74 kg/m.           Physical Examination:   General appearance: alert, well appearing, and in no distress  Mental status: alert, oriented to person, place, and time  Skin: warm & dry   Extremities: Edema: None    Cardiovascular: normal heart rate noted  Respiratory: normal respiratory effort, no distress  Abdomen: gravid, soft, non-tender  Pelvic: Cervical exam deferred         Fetal Status: Fetal Heart Rate (bpm): 141/145 u/s   Movement: Present    Fetal Surveillance Testing today: US  DC/DA TWINS 20+1 wks,CX 3.2 cm,normal right ovary,left hydrosalpinx,left echogenic mass with posterior shadowing ? Dermoid 2 x 1.7 x 2 cm BABY A: female,cephalic inferior,anterior placenta gr 0,FHR 141 bpm,SVP of fluid 4.3 cm,EFW 336 g 46%,anatomy complete,no obvious abnormalities  BABY B:female, transverse head left superior,anterior placenta gr 0,SVP of fluid 6.3 cm,FHR 145 bpm,EFW 336 g 46%,discordance .2%,anatomy  complete,no obvious abnormalities    No results found for this or any previous visit (from the past 24 hours).  Assessment & Plan:  High-risk pregnancy: [id number] at [redacted]w[redacted]d with an Estimated Date of Delivery: 09/08/24   1) DC/DA twins, nl anatomy today; growth discordance 0.2%; 2cm ?dermoid cyst; scheduled out for growth q 4wks  2) HIV+, taking Biktarvy ; viral load 12/17 was 70  3) s/p lab chole on 04/06/24, feeling much better  4) Hx A2GDM, GTT 26wks  Meds: No orders of the defined types were placed in this encounter.   Labs/procedures today: U/S  Treatment Plan:  growth q 4wks  Reviewed: Preterm labor symptoms and general obstetric precautions including but not limited to vaginal bleeding, contractions, leaking of fluid and fetal movement were reviewed in detail with the patient.  All questions were  answered. Does have home bp cuff. Office bp cuff given: not applicable. Check bp weekly, let us  know if consistently >140 and/or >90.  Follow-up: Return in about 4 weeks (around 05/20/2024) for HROB, US : EFW and q 4weeks x 4.   No future appointments.  Orders Placed This Encounter  Procedures   US  OB Follow Up   US  OB Follow Up AddL Gest   Suzen JONETTA Gentry [facility] 04/22/2024 4:51 PM  "

## 2024-04-22 NOTE — Patient Instructions (Signed)
 Alyssa Sandoval, thank you for choosing our office today! We appreciate the opportunity to meet your healthcare needs. You may receive a short survey by mail, e-mail, or through [employer]. If you are happy with your care we would appreciate if you could take just a few minutes to complete the survey questions. We read all of your comments and take your feedback very seriously. Thank you again for choosing our office.  Center for [employer] Team at [facility] at [facility] ([address] [place], [place] [address]) Entrance C, located off of [address] Free 24/7 valet parking  Go to [email or web address] to register for FREE online childbirth classes  Call the office [phone]) or go to [facility] if: You begin to severe cramping Your water breaks.  Sometimes it is a big gush of fluid, sometimes it is just a trickle that keeps getting your panties wet or running down your legs You have vaginal bleeding.  It is normal to have a small amount of spotting if your cervix was checked.   [facility] Pediatricians/Family Doctors  [facility]): [address] Luba BROCKS, [phone]           [facility]: [address], [phone]                [facility]): [address], [phone] (call to ask if accepting patients) [facility] Department: [address], [place], [phone]    [facility] Pediatricians/Family Doctors [facility]): [phone], [phone] [facility]: [address] [place], [phone] [facility]: [address], [phone]  [facility] Doctors  [facility]): [phone] [facility]: [address], [phone]   [facility] Doctors [facility]: [address] [phone]  [facility] Doctors  [employer] [facility]: [phone], [phone]  Home Blood Pressure Monitoring for Patients   Your provider has recommended that you check your blood pressure (BP) at least once a week at home. If you do not have a blood pressure cuff at home, one will be provided for you. Contact your provider if you have not received your monitor within 1 week.   Helpful Tips for Accurate Home Blood Pressure Checks  Don't smoke, exercise, or drink caffeine  30 minutes before checking your BP Use the restroom before checking your BP (a full bladder can raise your pressure) Relax in a comfortable upright chair Feet on the ground Left arm resting comfortably on a flat surface at the level of your heart Legs uncrossed Back supported Sit quietly and don't talk Place the cuff on your bare arm Adjust snuggly, so that only two fingertips can fit between your skin and the top of the cuff Check 2 readings separated by at least one minute Keep a log of your BP readings For a visual, please reference this diagram: http://ccnc.care/bpdiagram  Provider Name: [facility]     Phone: [phone]  Zone 1: ALL CLEAR  Continue to monitor your symptoms:  BP reading is less than 140 (top number) or less than 90 (bottom number)  No right upper stomach pain No headaches or seeing spots No feeling nauseated or throwing up No swelling in face and hands  Zone 2: CAUTION Call your doctor's office for any of the following:  BP reading is greater than 140 (top number) or greater than  90 (bottom number)  Stomach pain under your ribs in the middle or right side Headaches or seeing spots Feeling nauseated or throwing up Swelling in face and hands  Zone 3: EMERGENCY  Seek immediate medical care if you have any of the following:  BP reading is greater than160 (top number) or greater than 110 (bottom number) Severe headaches not improving with Tylenol  Serious difficulty catching your breath Any worsening symptoms  from Zone 2     Second Trimester of Pregnancy The second trimester is from week 14 through week 27 (months 4 through 6). The second trimester is often a time when you feel your best. Your body has adjusted to being pregnant, and you begin to feel better physically. Usually, morning sickness has lessened or quit completely, you may have more energy, and you may have an increase in appetite. The second trimester is also a time when the fetus is growing rapidly. At the end of the sixth month, the fetus is about 9 inches long and weighs about 1 pounds. You will likely begin to feel the baby move (quickening) between 16 and 20 weeks of pregnancy. Body changes during your second trimester Your body continues to go through many changes during your second trimester. The changes vary from woman to woman. Your weight will continue to increase. You will notice your lower abdomen bulging out. You may begin to get stretch marks on your hips, abdomen, and breasts. You may develop headaches that can be relieved by medicines. The medicines should be approved by your health care provider. You may urinate more often because the fetus is pressing on your bladder. You may develop or continue to have heartburn as a result of your pregnancy. You may develop constipation because certain hormones are causing the muscles that push waste through your intestines to slow down. You may develop hemorrhoids or swollen, bulging veins (varicose veins). You may have back pain. This is caused by: Weight gain. Pregnancy hormones that are relaxing the joints in your pelvis. A shift in weight and the muscles that support your balance. Your breasts will continue to grow and they will continue to become tender. Your gums may bleed and may be sensitive to brushing and flossing. Dark spots or blotches (chloasma, mask of pregnancy) may develop on your face. This will likely fade after the baby is born. A dark line from your belly button  to the pubic area (linea nigra) may appear. This will likely fade after the baby is born. You may have changes in your hair. These can include thickening of your hair, rapid growth, and changes in texture. Some women also have hair loss during or after pregnancy, or hair that feels dry or thin. Your hair will most likely return to normal after your baby is born.  What to expect at prenatal visits During a routine prenatal visit: You will be weighed to make sure you and the fetus are growing normally. Your blood pressure will be taken. Your abdomen will be measured to track your baby's growth. The fetal heartbeat will be listened to. Any test results from the previous visit will be discussed.  Your health care provider may ask you: How you are feeling. If you are feeling the baby move. If you have had any abnormal symptoms, such as leaking fluid, bleeding, severe headaches, or abdominal cramping. If you are using any tobacco products, including cigarettes, chewing tobacco, and electronic cigarettes. If you have any questions.  Other tests that may be performed during  your second trimester include: Blood tests that check for: Low iron levels (anemia). High blood sugar that affects pregnant women (gestational diabetes) between 88 and 28 weeks. Rh antibodies. This is to check for a protein on red blood cells (Rh factor). Urine tests to check for infections, diabetes, or protein in the urine. An ultrasound to confirm the proper growth and development of the baby. An amniocentesis to check for possible genetic problems. Fetal screens for spina bifida and Down syndrome. HIV (human immunodeficiency virus) testing. Routine prenatal testing includes screening for HIV, unless you choose not to have this test.  Follow these instructions at home: Medicines Follow your health care provider's instructions regarding medicine use. Specific medicines may be either safe or unsafe to take during  pregnancy. Take a prenatal vitamin that contains at least 600 micrograms (mcg) of folic acid. If you develop constipation, try taking a stool softener if your health care provider approves. Eating and drinking Eat a balanced diet that includes fresh fruits and vegetables, whole grains, good sources of protein such as meat, eggs, or tofu, and low-fat dairy. Your health care provider will help you determine the amount of weight gain that is right for you. Avoid raw meat and uncooked cheese. These carry germs that can cause birth defects in the baby. If you have low calcium  intake from food, talk to your health care provider about whether you should take a daily calcium  supplement. Limit foods that are high in fat and processed sugars, such as fried and sweet foods. To prevent constipation: Drink enough fluid to keep your urine clear or pale yellow. Eat foods that are high in fiber, such as fresh fruits and vegetables, whole grains, and beans. Activity Exercise only as directed by your health care provider. Most women can continue their usual exercise routine during pregnancy. Try to exercise for 30 minutes at least 5 days a week. Stop exercising if you experience uterine contractions. Avoid heavy lifting, wear low heel shoes, and practice good posture. A sexual relationship may be continued unless your health care provider directs you otherwise. Relieving pain and discomfort Wear a good support bra to prevent discomfort from breast tenderness. Take warm sitz baths to soothe any pain or discomfort caused by hemorrhoids. Use hemorrhoid cream if your health care provider approves. Rest with your legs elevated if you have leg cramps or low back pain. If you develop varicose veins, wear support hose. Elevate your feet for 15 minutes, 3-4 times a day. Limit salt in your diet. Prenatal Care Write down your questions. Take them to your prenatal visits. Keep all your prenatal visits as told by your health  care provider. This is important. Safety Wear your seat belt at all times when driving. Make a list of emergency phone numbers, including numbers for family, friends, the hospital, and police and fire departments. General instructions Ask your health care provider for a referral to a local prenatal education class. Begin classes no later than the beginning of month 6 of your pregnancy. Ask for help if you have counseling or nutritional needs during pregnancy. Your health care provider can offer advice or refer you to specialists for help with various needs. Do not use hot tubs, steam rooms, or saunas. Do not douche or use tampons or scented sanitary pads. Do not cross your legs for long periods of time. Avoid cat litter boxes and soil used by cats. These carry germs that can cause birth defects in the baby and possibly loss of the  fetus by miscarriage or stillbirth. Avoid all smoking, herbs, alcohol, and unprescribed drugs. Chemicals in these products can affect the formation and growth of the baby. Do not use any products that contain nicotine or tobacco, such as cigarettes and e-cigarettes. If you need help quitting, ask your health care provider. Visit your dentist if you have not gone yet during your pregnancy. Use a soft toothbrush to brush your teeth and be gentle when you floss. Contact a health care provider if: You have dizziness. You have mild pelvic cramps, pelvic pressure, or nagging pain in the abdominal area. You have persistent nausea, vomiting, or diarrhea. You have a bad smelling vaginal discharge. You have pain when you urinate. Get help right away if: You have a fever. You are leaking fluid from your vagina. You have spotting or bleeding from your vagina. You have severe abdominal cramping or pain. You have rapid weight gain or weight loss. You have shortness of breath with chest pain. You notice sudden or extreme swelling of your face, hands, ankles, feet, or legs. You  have not felt your baby move in over an hour. You have severe headaches that do not go away when you take medicine. You have vision changes. Summary The second trimester is from week 14 through week 27 (months 4 through 6). It is also a time when the fetus is growing rapidly. Your body goes through many changes during pregnancy. The changes vary from woman to woman. Avoid all smoking, herbs, alcohol, and unprescribed drugs. These chemicals affect the formation and growth your baby. Do not use any tobacco products, such as cigarettes, chewing tobacco, and e-cigarettes. If you need help quitting, ask your health care provider. Contact your health care provider if you have any questions. Keep all prenatal visits as told by your health care provider. This is important. This information is not intended to replace advice given to you by your health care provider. Make sure you discuss any questions you have with your health care provider. Document Released: 03/20/2001 Document Revised: 09/01/2015 Document Reviewed: 05/27/2012 Elsevier Interactive Patient Education  2017 [employer].

## 2024-04-22 NOTE — Progress Notes (Signed)
 US  DC/DA TWINS 20+1 wks,CX 3.2 cm,normal right ovary,left hydrosalpinx,left echogenic mass with posterior shadowing ? Dermoid 2 x 1.7 x 2 cm BABY A: female,cephalic inferior,anterior placenta gr 0,FHR 141 bpm,SVP of fluid 4.3 cm,EFW 336 g 46%,anatomy complete,no obvious abnormalities  BABY B:female, transverse head left superior,anterior placenta gr 0,SVP of fluid 6.3 cm,FHR 145 bpm,EFW 336 g 46%,discordance .2%,anatomy complete,no obvious abnormalities

## 2024-04-25 ENCOUNTER — Inpatient Hospital Stay (HOSPITAL_COMMUNITY)
Admission: AD | Admit: 2024-04-25 | Discharge: 2024-04-25 | Disposition: A | Discharge status: Home / Self Care | Payer: MEDICAID | Attending: Obstetrics and Gynecology | Admitting: Obstetrics and Gynecology

## 2024-04-25 ENCOUNTER — Encounter (HOSPITAL_COMMUNITY): Payer: Self-pay | Admitting: Obstetrics and Gynecology

## 2024-04-25 DIAGNOSIS — O26892 Other specified pregnancy related conditions, second trimester: Secondary | ICD-10-CM | POA: Insufficient documentation

## 2024-04-25 DIAGNOSIS — R109 Unspecified abdominal pain: Secondary | ICD-10-CM | POA: Diagnosis not present

## 2024-04-25 DIAGNOSIS — O30042 Twin pregnancy, dichorionic/diamniotic, second trimester: Secondary | ICD-10-CM | POA: Diagnosis present

## 2024-04-25 DIAGNOSIS — O26899 Other specified pregnancy related conditions, unspecified trimester: Secondary | ICD-10-CM

## 2024-04-25 DIAGNOSIS — M546 Pain in thoracic spine: Secondary | ICD-10-CM | POA: Diagnosis not present

## 2024-04-25 DIAGNOSIS — Z3A20 20 weeks gestation of pregnancy: Secondary | ICD-10-CM | POA: Insufficient documentation

## 2024-04-25 DIAGNOSIS — O98713 Human immunodeficiency virus [HIV] disease complicating pregnancy, third trimester: Secondary | ICD-10-CM | POA: Insufficient documentation

## 2024-04-25 DIAGNOSIS — R1010 Upper abdominal pain, unspecified: Secondary | ICD-10-CM | POA: Insufficient documentation

## 2024-04-25 DIAGNOSIS — Z21 Asymptomatic human immunodeficiency virus [HIV] infection status: Secondary | ICD-10-CM | POA: Diagnosis not present

## 2024-04-25 LAB — COMPREHENSIVE METABOLIC PANEL WITH GFR
ALT: 15 U/L (ref 0–44)
AST: 13 U/L — ABNORMAL LOW (ref 15–41)
Albumin: 3.5 g/dL (ref 3.5–5.0)
Alkaline Phosphatase: 96 U/L (ref 38–126)
Anion gap: 10 (ref 5–15)
BUN: 6 mg/dL (ref 6–20)
CO2: 21 mmol/L — ABNORMAL LOW (ref 22–32)
Calcium: 8.4 mg/dL — ABNORMAL LOW (ref 8.9–10.3)
Chloride: 103 mmol/L (ref 98–111)
Creatinine, Ser: 0.39 mg/dL — ABNORMAL LOW (ref 0.44–1.00)
GFR, Estimated: >60 mL/min
Glucose, Bld: 90 mg/dL (ref 70–99)
Potassium: 3.4 mmol/L — ABNORMAL LOW (ref 3.5–5.1)
Sodium: 134 mmol/L — ABNORMAL LOW (ref 135–145)
Total Bilirubin: 0.2 mg/dL (ref 0.0–1.2)
Total Protein: 6.3 g/dL — ABNORMAL LOW (ref 6.5–8.1)

## 2024-04-25 LAB — CBC
HCT: 28.1 % — ABNORMAL LOW (ref 36.0–46.0)
Hemoglobin: 9 g/dL — ABNORMAL LOW (ref 12.0–15.0)
MCH: 25.3 pg — ABNORMAL LOW (ref 26.0–34.0)
MCHC: 32 g/dL (ref 30.0–36.0)
MCV: 78.9 fL — ABNORMAL LOW (ref 80.0–100.0)
Platelets: 321 K/uL (ref 150–400)
RBC: 3.56 MIL/uL — ABNORMAL LOW (ref 3.87–5.11)
RDW: 14.5 % (ref 11.5–15.5)
WBC: 11.6 K/uL — ABNORMAL HIGH (ref 4.0–10.5)
nRBC: 0 % (ref 0.0–0.2)

## 2024-04-25 LAB — LIPASE, BLOOD: Lipase: 17 U/L (ref 11–51)

## 2024-04-25 LAB — WET PREP, GENITAL
Clue Cells Wet Prep HPF POC: NONE SEEN
Sperm: NONE SEEN
Trich, Wet Prep: NONE SEEN
WBC, Wet Prep HPF POC: >=10 — AB

## 2024-04-25 LAB — AMYLASE: Amylase: 58 U/L (ref 28–100)

## 2024-04-25 MED ORDER — ALUM & MAG HYDROXIDE-SIMETH 200-200-20 MG/5ML PO SUSP
30.0000 mL | Freq: Once | ORAL | Status: DC
  Filled 2024-04-25: qty 30

## 2024-04-25 MED ORDER — SIMETHICONE 80 MG PO CHEW
160.0000 mg | CHEWABLE_TABLET | Freq: Once | ORAL | Status: AC
  Administered 2024-04-25: 160 mg via ORAL
  Filled 2024-04-25: qty 2

## 2024-04-25 MED ORDER — DICYCLOMINE HCL 10 MG PO CAPS
10.0000 mg | ORAL_CAPSULE | Freq: Once | ORAL | Status: AC
  Administered 2024-04-25: 10 mg via ORAL
  Filled 2024-04-25: qty 1

## 2024-04-25 MED ORDER — ONDANSETRON HCL 4 MG/2ML IJ SOLN
4.0000 mg | Freq: Once | INTRAMUSCULAR | Status: AC
  Administered 2024-04-25: 4 mg via INTRAVENOUS
  Filled 2024-04-25: qty 2

## 2024-04-25 MED ORDER — LIDOCAINE VISCOUS HCL 2 % MT SOLN
15.0000 mL | Freq: Once | OROMUCOSAL | Status: DC
  Filled 2024-04-25: qty 15

## 2024-04-25 MED ORDER — FAMOTIDINE 20 MG PO TABS
20.0000 mg | ORAL_TABLET | Freq: Once | ORAL | Status: AC
  Administered 2024-04-25: 20 mg via ORAL
  Filled 2024-04-25: qty 1

## 2024-04-25 MED ORDER — PANTOPRAZOLE SODIUM 40 MG PO TBEC: 40.0000 mg | DELAYED_RELEASE_TABLET | Freq: Every day | ORAL | 0 refills | Status: AC

## 2024-04-25 MED ORDER — CYCLOBENZAPRINE HCL 10 MG PO TABS: 10.0000 mg | ORAL_TABLET | Freq: Three times a day (TID) | ORAL | 0 refills | Status: AC | PRN

## 2024-04-25 MED ORDER — CYCLOBENZAPRINE HCL 10 MG PO TABS
10.0000 mg | ORAL_TABLET | Freq: Once | ORAL | Status: AC
  Administered 2024-04-25: 10 mg via ORAL
  Filled 2024-04-25: qty 1

## 2024-04-25 MED ORDER — LACTATED RINGERS IV BOLUS
1000.0000 mL | Freq: Once | INTRAVENOUS | Status: AC
  Administered 2024-04-25: 1000 mL via INTRAVENOUS

## 2024-04-25 NOTE — MAU Provider Note (Cosign Needed Addendum)
 Chief Complaint:  Back Pain And upper gastric abdominal pain  HPI     Alyssa Sandoval is a 24 y.o. [id number] at [redacted]w[redacted]d who presents to maternity admissions reporting that she is having upper abdominal pain and upper back pain across her shoulder blades.  Patient denies eating anything any usual.  She denies any nausea/vomiting and reports no fever or chills. Normal BM's and no GI c/o s/p surgery until today   She also reports spotting when she wiped at 0200 denies any active vaginal bleeding, leaking of fluid, or contractions.     Of note status post complicated pregnancy DC/DA twins and HIV positive status on Biktarvy .  She is also history of A2 GDM she is status post lap chole on 04/06/2024.  Patient is blood type is AB+  [facility]  Past Medical History:  Diagnosis Date   Anemia    Angio-edema    Blood transfusion without reported diagnosis    3 units packed cells and 1 bag of iron   Chronic constipation 06/10/2012   COVID-19 04/2020   Depression    Diabetes mellitus without complication (HCC)    Dysfunctional uterine bleeding    Gallstones    GERD (gastroesophageal reflux disease) 06/10/2012   Gestational diabetes    Headache 12/09/2020   Headache 12/09/2020   High-risk pregnancy 07/13/2021   HIV (human immunodeficiency virus infection) (HCC)    HIV (human immunodeficiency virus infection) (HCC)    HIV disease (HCC) 11/01/2020   -on Biktarvy , last viral load: 07/13/21: 12,000  12/19/21: not detected   EFW @ 28, 36wks     No testing        C/S @ 38wks (VL >1K) or IOL @ 39wks (VL <1K)   HSV (herpes simplex virus) anogenital infection    Low back pain 12/09/2020   Low back pain 12/09/2020   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   Passive suicidal ideations 11/07/2020   Passive suicidal ideations 11/07/2020   Polyhydramnios 02/13/2022   Postpartum hemorrhage 02/16/2022   Pregnancy induced hypertension    Pyelonephritis 08/19/2021   Admission 5/13-5/15   Urine cx  poc 09/11/21 neg   Urticaria    Vaginal delivery 02/16/2022   Vaginal pruritus 03/15/2021   Vaginal yeast infection    OB History  Gravida Para Term Preterm AB Living  5 2 2  0 2 2  SAB IAB Ectopic Multiple Live Births  2 0 0 0 2    # Outcome Date GA Lbr Len/2nd Weight Sex Type Anes PTL Lv  5 Current           4 Term 01/10/23 [redacted]w[redacted]d / 00:09 3120 g F Vag-Spont EPI  LIV     Complications: Gestational diabetes, Postpartum hemorrhage  3 Term 02/14/22 [redacted]w[redacted]d 04:49 / 03:39 3220 g M Vag-Spont EPI  LIV     Complications: Postpartum hypertension, Postpartum hemorrhage  2 SAB 2020          1 SAB 2019           Past Surgical History:  Procedure Laterality Date   CHOLECYSTECTOMY  04/11/2024   NO PAST SURGERIES     Family History  Problem Relation Age of Onset   Diabetes Mother    Asthma Mother    Obesity Mother    Fibromyalgia Mother    Mental illness Mother    Other Father        not to her knowledge, re med problems   Diabetes Maternal Uncle    Asthma  Maternal Grandmother    Hypertension Maternal Grandmother    Cancer Maternal Grandfather    Colon cancer Maternal Grandfather    Cancer Other    Heart disease Neg Hx    Social History[1] Allergies[2] Medications Prior to Admission  Medication Sig Dispense Refill Last Dose/Taking   acetaminophen  (TYLENOL ) 500 MG tablet Take 1,000 mg by mouth every 6 (six) hours as needed for mild pain (pain score 1-3) or moderate pain (pain score 4-6).   Past Week   bictegravir-emtricitabine -tenofovir  AF (BIKTARVY ) 50-200-25 MG TABS tablet Take 1 tablet by mouth daily. 30 tablet 5 04/24/2024   cyclobenzaprine  (FLEXERIL ) 10 MG tablet Take 1 tablet (10 mg total) by mouth 3 (three) times daily as needed for muscle spasms. Can caused drowsiness! 15 tablet 0 Past Week   MELATONIN PO Take 1 each by mouth at bedtime. gummy   Past Month   ondansetron  (ZOFRAN -ODT) 4 MG disintegrating tablet Take 1-2 tablets (4-8 mg total) by mouth every 8 (eight) hours as  needed for nausea. 42 tablet 6 Unknown   polyethylene glycol powder (MIRALAX ) 17 GM/SCOOP powder Take 17 g by mouth daily. Dissolve 1 capful (17g) in 4-8 ounces of liquid and take by mouth daily. (Patient taking differently: Take 17 g by mouth as needed. Dissolve 1 capful (17g) in 4-8 ounces of liquid and take by mouth daily.) 238 g 4 Past Week   Prenatal MV & Min w/FA-DHA (PRENATAL GUMMIES PO) Take 2 each by mouth daily. gummies   Past Month   promethazine  (PHENERGAN ) 25 MG tablet Take 0.5-1 tablets (12.5-25 mg total) by mouth every 6 (six) hours as needed. 30 tablet 6 Unknown   aspirin  81 MG chewable tablet Chew 2 tablets (162 mg total) by mouth daily. (Patient not taking: Reported on 04/01/2024) 60 tablet 7 Unknown   Blood Pressure Monitoring (BLOOD PRESSURE CUFF) MISC Use as directed to check blood pressure daily (Patient not taking: No sig reported) 1 each 0 Unknown   ferrous sulfate  325 (65 FE) MG tablet Take 1 tablet (325 mg total) by mouth every other day. (Patient not taking: No sig reported) 30 tablet 3 Unknown   metoCLOPramide (REGLAN) 10 MG tablet Take 1 tablet (10 mg total) by mouth 3 (three) times daily with meals. (Patient taking differently: Take 10 mg by mouth as needed.) 90 tablet 1 Unknown   senna-docusate (SENOKOT-S) 8.6-50 MG tablet Take 2 tablets by mouth daily. (Patient not taking: Reported on 04/22/2024) 60 tablet 0    sertraline  (ZOLOFT ) 50 MG tablet Take half a tablet daily x1 week then take 1 full tablet daily. (Patient not taking: No sig reported) 30 tablet 2 Unknown   Vaginal Lubricant (REPHRESH ODOR ELIMINATING) GEL Place 1 Application vaginally 2 (two) times a week. (Patient taking differently: Place 1 Application vaginally as needed.) 2 g 8 Unknown    I have reviewed patient's Past Medical Hx, Surgical Hx, Family Hx, Social Hx, medications and allergies.   ROS  Pertinent items noted in HPI and remainder of comprehensive ROS otherwise negative.   PHYSICAL EXAM   Patient Vitals for the past 24 hrs:  BP Temp Temp src Pulse Resp Height Weight  04/25/24 0352 115/70 98.1 F (36.7 C) Oral 94 12 4' 10 (1.473 m) 83.1 kg    Physical Exam Vitals and nursing note reviewed. Exam conducted with a chaperone present.  Constitutional:      General: She is not in acute distress.    Appearance: Normal appearance. She is obese. She is  not ill-appearing.  HENT:     Head: Normocephalic.     Nose: Nose normal.     Mouth/Throat:     Mouth: Mucous membranes are moist.  Cardiovascular:     Rate and Rhythm: Normal rate.  Pulmonary:     Effort: Pulmonary effort is normal.  Abdominal:     General: A surgical scar is present. Bowel sounds are normal. There is distension.     Palpations: Abdomen is soft. There is fluid wave.     Tenderness: There is abdominal tenderness in the epigastric area. There is no guarding or rebound.      Comments: S/P Lap Cholecystectomy ( trocar incision sites marked above) - Well healed  Diffuse abdominal pain with audible tympanic  sounds on palpation c/w flatus   BS are Positive x 4  Musculoskeletal:        General: Normal range of motion.       Arms:     Cervical back: Normal range of motion.     Comments: Pain is B/L across shoulders under the shoulder blades   Skin:    General: Skin is warm.  Neurological:     Mental Status: She is alert and oriented to person, place, and time.  Psychiatric:        Mood and Affect: Mood normal.        Behavior: Behavior normal.      Fetal HR Di DI TIUP  A 143 via BSUS  B   152 via BSUS  Pt informed that the ultrasound is considered a limited OB ultrasound and is not intended to be a complete ultrasound exam.  Patient also informed that the ultrasound is not being completed with the intent of assessing for fetal or placental anomalies or any pelvic abnormalities.  Explained that the purpose of todays ultrasound is to assess for  maternal reassurance  and viability.  Patient  acknowledges the purpose of the exam and the limitations of the study.       Labs: Results for orders placed or performed during the hospital encounter of 04/25/24 (from the past 24 hours)  Wet prep, genital     Status: Abnormal   Collection Time: 04/25/24  4:38 AM  Result Value Ref Range   Yeast Wet Prep HPF POC PRESENT (A) NONE SEEN   Trich, Wet Prep NONE SEEN NONE SEEN   Clue Cells Wet Prep HPF POC NONE SEEN NONE SEEN   WBC, Wet Prep HPF POC >=10 (A) <10   Sperm NONE SEEN     Imaging:  No results found.  MDM & MAU COURSE  MDM:  HIGH /BSUS  CBC CMP Amylase  Lipase IVF UA Wet prep  GC Antiemetics ordered BSUS performed d/t TIUP at [redacted]w[redacted]d (see media for detailed imaging)  Patient reassessed at 0600 - still with c/o pain s/p antiemetics with GI cocktail and  Simethicone  for flatus Will add IV Zofran  and Bentyl  for likely spasms in the abdomen  RN informed me that IV team was called at 0745 and in the room now establishing IV for labs and IV bolus   MAU Course: Orders Placed This Encounter  Procedures   Wet prep, genital   Urinalysis, Routine w reflex microscopic -Urine, Clean Catch   Meds ordered this encounter  Medications   famotidine  (PEPCID ) tablet 20 mg   cyclobenzaprine  (FLEXERIL ) tablet 10 mg   AND Linked Order Group    alum & mag hydroxide-simeth (MAALOX/MYLANTA) 200-200-20 MG/5ML suspension 30 mL  lidocaine  (XYLOCAINE ) 2 % viscous mouth solution 15 mL   simethicone  (MYLICON) chewable tablet 160 mg   Signed out to [employer] CNM @ [address], [facility] ASSESSMENT  1. Abdominal pain affecting pregnancy (Primary) - Renewed Rx for Flexeril  - prescription for: Protonix  40 mg po every day  2. Dichorionic diamniotic twin pregnancy in second trimester  3. [redacted] weeks gestation of pregnancy   - Discharge home - Keep scheduled appt with [facility] - Patient verbalized an understanding of the plan of care and agrees.   Ala Cart,  CNM 04/25/2024 10:43 AM         [1]  Social History Tobacco Use   Smoking status: Former    Types: Cigars, E-cigarettes   Smokeless tobacco: Never   Tobacco comments:    vapes  Vaping Use   Vaping status: Former   Substances: Nicotine, CBD, Flavoring  Substance Use Topics   Alcohol use: Not Currently   Drug use: Not Currently    Types: Marijuana    Comment: more than a year ago  [2] No Known Allergies

## 2024-04-25 NOTE — MAU Note (Signed)
 Pt says she has upper abd pain and back pain  and spotting when she wiped - started 0200. Last sex- Thursday  [facility]- [facility]. No N/V

## 2024-04-27 LAB — GC/CHLAMYDIA PROBE AMP (~~LOC~~) NOT AT ARMC
Chlamydia: NEGATIVE
Comment: NEGATIVE
Comment: NORMAL
Neisseria Gonorrhea: NEGATIVE

## 2024-05-02 ENCOUNTER — Encounter (HOSPITAL_COMMUNITY): Payer: Self-pay | Admitting: Obstetrics & Gynecology

## 2024-05-02 ENCOUNTER — Other Ambulatory Visit: Payer: Self-pay

## 2024-05-02 ENCOUNTER — Inpatient Hospital Stay (HOSPITAL_COMMUNITY)
Admission: AD | Admit: 2024-05-02 | Discharge: 2024-05-02 | Disposition: A | Discharge status: Home / Self Care | Payer: Self-pay | Attending: Obstetrics & Gynecology | Admitting: Obstetrics & Gynecology

## 2024-05-02 DIAGNOSIS — O0993 Supervision of high risk pregnancy, unspecified, third trimester: Secondary | ICD-10-CM | POA: Diagnosis not present

## 2024-05-02 DIAGNOSIS — O23593 Infection of other part of genital tract in pregnancy, third trimester: Secondary | ICD-10-CM | POA: Insufficient documentation

## 2024-05-02 DIAGNOSIS — Z3A21 21 weeks gestation of pregnancy: Secondary | ICD-10-CM | POA: Diagnosis not present

## 2024-05-02 DIAGNOSIS — O0992 Supervision of high risk pregnancy, unspecified, second trimester: Secondary | ICD-10-CM

## 2024-05-02 DIAGNOSIS — N898 Other specified noninflammatory disorders of vagina: Secondary | ICD-10-CM | POA: Diagnosis present

## 2024-05-02 DIAGNOSIS — B9689 Other specified bacterial agents as the cause of diseases classified elsewhere: Secondary | ICD-10-CM

## 2024-05-02 LAB — URINALYSIS, ROUTINE W REFLEX MICROSCOPIC
Bacteria, UA: NONE SEEN
Bilirubin Urine: NEGATIVE
Glucose, UA: NEGATIVE mg/dL
Hgb urine dipstick: NEGATIVE
Ketones, ur: NEGATIVE mg/dL
Leukocytes,Ua: NEGATIVE
Nitrite: NEGATIVE
Protein, ur: 30 mg/dL — AB
Specific Gravity, Urine: 1.021 (ref 1.005–1.030)
pH: 7 (ref 5.0–8.0)

## 2024-05-02 LAB — WET PREP, GENITAL
Sperm: NONE SEEN
Trich, Wet Prep: NONE SEEN
WBC, Wet Prep HPF POC: <10
Yeast Wet Prep HPF POC: NONE SEEN

## 2024-05-02 LAB — POCT FERN TEST: POCT Fern Test: NEGATIVE

## 2024-05-02 MED ORDER — METRONIDAZOLE 500 MG PO TABS: 500.0000 mg | ORAL_TABLET | Freq: Two times a day (BID) | ORAL | 0 refills | Status: AC

## 2024-05-02 NOTE — MAU Provider Note (Signed)
 Chief Complaint:  Vaginal Discharge and Rupture of Membranes   HPI    Alyssa Sandoval is a 24 y.o. [id number] at [redacted]w[redacted]d who presents to maternity admissions reporting discharge that is very watery, has been yellow and green.  She states her underwear have stayed wet recently.   Of note status post complicated pregnancy DC/DA twins and HIV positive status on Biktarvy .  She is also history of A2 GDM she is status post lap chole on 04/06/2024.  Patient is blood type is AB+   [facility]: [facility]  Past Medical History:  Diagnosis Date   Anemia    Angio-edema    Blood transfusion without reported diagnosis    3 units packed cells and 1 bag of iron   Chronic constipation 06/10/2012   COVID-19 04/2020   Depression    Diabetes mellitus without complication (HCC)    Dysfunctional uterine bleeding    Gallstones    GERD (gastroesophageal reflux disease) 06/10/2012   Gestational diabetes    Headache 12/09/2020   Headache 12/09/2020   High-risk pregnancy 07/13/2021   HIV (human immunodeficiency virus infection) (HCC)    HIV (human immunodeficiency virus infection) (HCC)    HIV disease (HCC) 11/01/2020   -on Biktarvy , last viral load: 07/13/21: 12,000  12/19/21: not detected   EFW @ 28, 36wks     No testing        C/S @ 38wks (VL >1K) or IOL @ 39wks (VL <1K)   HSV (herpes simplex virus) anogenital infection    Low back pain 12/09/2020   Low back pain 12/09/2020   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   Passive suicidal ideations 11/07/2020   Passive suicidal ideations 11/07/2020   Polyhydramnios 02/13/2022   Postpartum hemorrhage 02/16/2022   Pregnancy induced hypertension    Pyelonephritis 08/19/2021   Admission 5/13-5/15   Urine cx poc 09/11/21 neg   Urticaria    Vaginal delivery 02/16/2022   Vaginal pruritus 03/15/2021   Vaginal yeast infection    OB History  Gravida Para Term Preterm AB Living  5 2 2  0 2 2  SAB IAB Ectopic Multiple Live Births  2 0 0 0 2    # Outcome Date GA  Lbr Len/2nd Weight Sex Type Anes PTL Lv  5 Current           4 Term 01/10/23 [redacted]w[redacted]d / 00:09 3120 g F Vag-Spont EPI  LIV     Complications: Gestational diabetes, Postpartum hemorrhage  3 Term 02/14/22 [redacted]w[redacted]d 04:49 / 03:39 3220 g M Vag-Spont EPI  LIV     Complications: Postpartum hypertension, Postpartum hemorrhage  2 SAB 2020          1 SAB 2019           Past Surgical History:  Procedure Laterality Date   CHOLECYSTECTOMY  04/11/2024   NO PAST SURGERIES     Family History  Problem Relation Age of Onset   Diabetes Mother    Asthma Mother    Obesity Mother    Fibromyalgia Mother    Mental illness Mother    Other Father        not to her knowledge, re med problems   Diabetes Maternal Uncle    Asthma Maternal Grandmother    Hypertension Maternal Grandmother    Cancer Maternal Grandfather    Colon cancer Maternal Grandfather    Cancer Other    Heart disease Neg Hx    Social History[1] Allergies[2] No medications prior to admission.  I have reviewed patient's Past Medical Hx, Surgical Hx, Family Hx, Social Hx, medications and allergies.   ROS  Pertinent items noted in HPI and remainder of comprehensive ROS otherwise negative.   PHYSICAL EXAM  Patient Vitals for the past 24 hrs:  BP Temp Temp src Pulse Resp SpO2 Height Weight  05/02/24 1304 114/69 -- -- 100 16 -- -- --  05/02/24 1052 112/70 98.8 F (37.1 C) Oral (!) 110 20 99 % -- --  05/02/24 1046 -- -- -- -- -- -- 5' (1.524 m) 83.6 kg    Constitutional: Well-developed, well-nourished female in no acute distress.  Cardiovascular: normal rate & rhythm, warm and well-perfused Respiratory: normal effort, no problems with respiration noted GI: Abd soft, non-tender, non-distended MS: Extremities nontender, no edema, normal ROM Neurologic: Alert and oriented x 4.  GU: no CVA tenderness Pelvic: normal external female genitalia, light yellow discharge noted, no blood, cervix clean, cervix appears closed.      Fetal HR Di  DI TIUP   A 142 via BSUS   B  155 via BSUS   Pt informed that the ultrasound is considered a limited OB ultrasound and is not intended to be a complete ultrasound exam.  Patient also informed that the ultrasound is not being completed with the intent of assessing for fetal or placental anomalies or any pelvic abnormalities.  Explained that the purpose of todays ultrasound is to assess for  maternal reassurance  and viability.  Patient acknowledges the purpose of the exam and the limitations of the study.        Labs: Results for orders placed or performed during the hospital encounter of 05/02/24 (from the past 24 hours)  POCT fern test     Status: Normal   Collection Time: 05/02/24 11:35 AM  Result Value Ref Range   POCT Fern Test Negative = intact amniotic membranes   Wet prep, genital     Status: Abnormal   Collection Time: 05/02/24 11:40 AM  Result Value Ref Range   Yeast Wet Prep HPF POC NONE SEEN NONE SEEN   Trich, Wet Prep NONE SEEN NONE SEEN   Clue Cells Wet Prep HPF POC PRESENT (A) NONE SEEN   WBC, Wet Prep HPF POC <10 <10   Sperm NONE SEEN   Urinalysis, Routine w reflex microscopic -Urine, Clean Catch     Status: Abnormal   Collection Time: 05/02/24 11:58 AM  Result Value Ref Range   Color, Urine YELLOW YELLOW   APPearance HAZY (A) CLEAR   Specific Gravity, Urine 1.021 1.005 - 1.030   pH 7.0 5.0 - 8.0   Glucose, UA NEGATIVE NEGATIVE mg/dL   Hgb urine dipstick NEGATIVE NEGATIVE   Bilirubin Urine NEGATIVE NEGATIVE   Ketones, ur NEGATIVE NEGATIVE mg/dL   Protein, ur 30 (A) NEGATIVE mg/dL   Nitrite NEGATIVE NEGATIVE   Leukocytes,Ua NEGATIVE NEGATIVE   RBC / HPF 0-5 0 - 5 RBC/hpf   WBC, UA 11-20 0 - 5 WBC/hpf   Bacteria, UA NONE SEEN NONE SEEN   Squamous Epithelial / HPF 0-5 0 - 5 /HPF   Mucus PRESENT     Imaging:  No results found.  MDM & MAU COURSE  MDM: Low  MAU Course: Orders Placed This Encounter  Procedures   Wet prep, genital   Urinalysis, Routine w  reflex microscopic -Urine, Clean Catch   POCT fern test   Discharge patient   Meds ordered this encounter  Medications   metroNIDAZOLE  (FLAGYL ) 500 MG  tablet    Sig: Take 1 tablet (500 mg total) by mouth 2 (two) times daily.    Dispense:  14 tablet    Refill:  0    ASSESSMENT   1. Supervision of high risk pregnancy in second trimester   2. Bacterial vaginosis   3. [redacted] weeks gestation of pregnancy     PLAN  Discharge home in stable condition with return precautions.  Medication sent to [facility] per pt request  Keep follow appointments with [facility]  Follow-up Information     [facility] Follow up.   Why: as scheduled for ongoing prenatal care Contact information: [address] [place]  [address] [phone]                Allergies as of 05/02/2024   No Known Allergies      Medication List     TAKE these medications    acetaminophen  500 MG tablet Commonly known as: TYLENOL  Take 1,000 mg by mouth every 6 (six) hours as needed for mild pain (pain score 1-3) or moderate pain (pain score 4-6).   aspirin  81 MG chewable tablet Chew 2 tablets (162 mg total) by mouth daily.   Biktarvy  50-200-25 MG Tabs tablet Generic drug: bictegravir-emtricitabine -tenofovir  AF Take 1 tablet by mouth daily.   Blood Pressure Cuff Misc Use as directed to check blood pressure daily   cyclobenzaprine  10 MG tablet Commonly known as: FLEXERIL  Take 1 tablet (10 mg total) by mouth 3 (three) times daily as needed for muscle spasms. Can caused drowsiness!   ferrous sulfate  325 (65 FE) MG tablet Take 1 tablet (325 mg total) by mouth every other day.   MELATONIN PO Take 1 each by mouth at bedtime. gummy   metoCLOPramide 10 MG tablet Commonly known as: REGLAN Take 1 tablet (10 mg total) by mouth 3 (three) times daily with meals. What changed:  when to take this reasons to take this   metroNIDAZOLE  500 MG tablet Commonly known as:  FLAGYL  Take 1 tablet (500 mg total) by mouth 2 (two) times daily.   ondansetron  4 MG disintegrating tablet Commonly known as: ZOFRAN -ODT Take 1-2 tablets (4-8 mg total) by mouth every 8 (eight) hours as needed for nausea.   pantoprazole  40 MG tablet Commonly known as: Protonix  Take 1 tablet (40 mg total) by mouth daily.   polyethylene glycol powder 17 GM/SCOOP powder Commonly known as: MiraLax  Take 17 g by mouth daily. Dissolve 1 capful (17g) in 4-8 ounces of liquid and take by mouth daily. What changed:  when to take this reasons to take this   PRENATAL GUMMIES PO Take 2 each by mouth daily. gummies   promethazine  25 MG tablet Commonly known as: PHENERGAN  Take 0.5-1 tablets (12.5-25 mg total) by mouth every 6 (six) hours as needed.   Rephresh Odor Eliminating Gel Place 1 Application vaginally 2 (two) times a week. What changed:  when to take this reasons to take this   senna-docusate 8.6-50 MG tablet Commonly known as: Senokot-S Take 2 tablets by mouth daily.   sertraline  50 MG tablet Commonly known as: ZOLOFT  Take half a tablet daily x1 week then take 1 full tablet daily.        Tawni Banner, CNM Certified Nurse Midwife, [facility]         [1]  Social History Tobacco Use   Smoking status: Former    Types: Cigars, E-cigarettes   Smokeless tobacco: Never   Tobacco comments:  vapes  Vaping Use   Vaping status: Former   Substances: Nicotine, CBD, Flavoring  Substance Use Topics   Alcohol use: Not Currently   Drug use: Not Currently    Types: Marijuana    Comment: more than a year ago  [2] No Known Allergies

## 2024-05-02 NOTE — MAU Note (Signed)
 Alyssa Sandoval is a 24 y.o. at [redacted]w[redacted]d here in MAU reporting: she's been leaking a watery clear yellow discharge for the past 2 days.  Reports fluid doesn't have any odor.  States after urinating vaginal remains moist.  Denies VB.  Reports has twin gestation and does feel FM from each.  LMP: NA Onset of complaint: 2 days Pain score: 0 Vitals:   05/02/24 1052  BP: 112/70  Pulse: (!) 110  Resp: 20  Temp: 98.8 F (37.1 C)  SpO2: 99%     FHT: deferred, twin gestation, will request APP confirm with US   Lab orders placed from triage: UA

## 2024-05-04 LAB — GC/CHLAMYDIA PROBE AMP (~~LOC~~) NOT AT ARMC
Chlamydia: NEGATIVE
Comment: NEGATIVE
Comment: NORMAL
Neisseria Gonorrhea: NEGATIVE

## 2024-05-11 ENCOUNTER — Encounter: Payer: Self-pay | Admitting: Obstetrics & Gynecology

## 2024-05-13 ENCOUNTER — Ambulatory Visit: Payer: MEDICAID

## 2024-05-13 ENCOUNTER — Other Ambulatory Visit (HOSPITAL_COMMUNITY)
Admission: RE | Admit: 2024-05-13 | Discharge: 2024-05-13 | Disposition: A | Payer: MEDICAID | Source: Ambulatory Visit | Attending: Obstetrics & Gynecology | Admitting: Obstetrics & Gynecology

## 2024-05-13 VITALS — BP 102/70

## 2024-05-13 DIAGNOSIS — N898 Other specified noninflammatory disorders of vagina: Secondary | ICD-10-CM

## 2024-05-13 DIAGNOSIS — R339 Retention of urine, unspecified: Secondary | ICD-10-CM

## 2024-05-13 DIAGNOSIS — R10A3 Flank pain, bilateral: Secondary | ICD-10-CM

## 2024-05-13 LAB — POCT URINALYSIS DIPSTICK OB
Blood, UA: NEGATIVE
Glucose, UA: NEGATIVE
Ketones, UA: NEGATIVE
Nitrite, UA: NEGATIVE

## 2024-05-13 MED ORDER — NITROFURANTOIN MONOHYD MACRO 100 MG PO CAPS: 100.0000 mg | ORAL_CAPSULE | Freq: Two times a day (BID) | ORAL | 0 refills | Status: AC

## 2024-05-13 NOTE — Progress Notes (Signed)
" ° °  NURSE VISIT- UTI SYMPTOMS   SUBJECTIVE:  Alyssa Sandoval is a 24 y.o. [phone] female here for UTI symptoms. She is [redacted]w[redacted]d pregnant. She reports flank pain bilaterally, urinary retention, and also experiencing irritation when urinating. Wanted to do self swab as well just in case.  OBJECTIVE:  BP 102/70   LMP  (LMP Unknown)   Appears well, in no apparent distress  Results for orders placed or performed in visit on 05/13/24 (from the past 24 hours)  POC Urinalysis Dipstick OB   Collection Time: 05/13/24  2:49 PM  Result Value Ref Range   Color, UA     Clarity, UA     Glucose, UA Negative Negative   Bilirubin, UA     Ketones, UA neg    Spec Grav, UA     Blood, UA Neg    pH, UA     POC,PROTEIN,UA Trace Negative, Trace, Small (1+), Moderate (2+), Large (3+), 4+   Urobilinogen, UA     Nitrite, UA neg    Leukocytes, UA Large (3+) (A) Negative   Appearance     Odor      ASSESSMENT: Pregnancy [redacted]w[redacted]d with UTI symptoms and negative nitrites  PLAN: Discussed with Luke Fetters, CNM, [facility]   Rx sent by provider today: Yes Urine culture sent Swab sent to test for BV and Yeast as well Call or return to clinic prn if these symptoms worsen or fail to improve as anticipated. Follow-up: as scheduled   Trever Streater E Raysha Tilmon  05/13/2024 2:51 PM  "

## 2024-05-13 NOTE — Addendum Note (Signed)
 Addended by: KIZZIE SUZEN SAUNDERS on: 05/13/2024 03:39 PM   Modules accepted: Orders

## 2024-05-14 LAB — URINALYSIS, ROUTINE W REFLEX MICROSCOPIC
Bilirubin, UA: NEGATIVE
Glucose, UA: NEGATIVE
Ketones, UA: NEGATIVE
Nitrite, UA: NEGATIVE
RBC, UA: NEGATIVE
Specific Gravity, UA: 1.025 (ref 1.005–1.030)
Urobilinogen, Ur: 1 mg/dL (ref 0.2–1.0)
pH, UA: 6.5 (ref 5.0–7.5)

## 2024-05-14 LAB — MICROSCOPIC EXAMINATION
Casts: NONE SEEN /LPF
Epithelial Cells (non renal): >10 /HPF — AB (ref 0–10)

## 2024-05-15 LAB — CERVICOVAGINAL ANCILLARY ONLY
Bacterial Vaginitis (gardnerella): POSITIVE — AB
Candida Glabrata: NEGATIVE
Candida Vaginitis: POSITIVE — AB
Comment: NEGATIVE
Comment: NEGATIVE
Comment: NEGATIVE

## 2024-05-15 LAB — URINE CULTURE

## 2024-05-19 ENCOUNTER — Encounter: Payer: MEDICAID | Admitting: Obstetrics & Gynecology

## 2024-05-19 ENCOUNTER — Other Ambulatory Visit: Payer: MEDICAID

## 2024-05-20 ENCOUNTER — Encounter: Payer: MEDICAID | Admitting: Women's Health

## 2024-05-20 ENCOUNTER — Other Ambulatory Visit: Payer: MEDICAID

## 2024-06-16 ENCOUNTER — Encounter: Payer: MEDICAID | Admitting: Obstetrics & Gynecology

## 2024-06-16 ENCOUNTER — Other Ambulatory Visit: Payer: MEDICAID | Admitting: Radiology

## 2024-06-16 ENCOUNTER — Other Ambulatory Visit: Payer: MEDICAID

## 2024-06-17 ENCOUNTER — Other Ambulatory Visit: Payer: MEDICAID | Admitting: Radiology

## 2024-06-17 ENCOUNTER — Encounter: Payer: MEDICAID | Admitting: Women's Health

## 2024-06-17 ENCOUNTER — Other Ambulatory Visit: Payer: MEDICAID

## 2024-07-14 ENCOUNTER — Other Ambulatory Visit: Payer: MEDICAID

## 2024-07-14 ENCOUNTER — Encounter: Payer: MEDICAID | Admitting: Obstetrics & Gynecology

## 2024-08-11 ENCOUNTER — Other Ambulatory Visit: Payer: MEDICAID
